# Patient Record
Sex: Male | Born: 1955 | Race: White | Hispanic: No | Marital: Married | Smoking: Former smoker
Health system: Southern US, Community
[De-identification: ages and names within clinical notes are randomized; demographics above are authoritative.]

## PROBLEM LIST (undated history)

## (undated) DIAGNOSIS — E119 Type 2 diabetes mellitus without complications: Secondary | ICD-10-CM

## (undated) DIAGNOSIS — D689 Coagulation defect, unspecified: Secondary | ICD-10-CM

## (undated) DIAGNOSIS — K219 Gastro-esophageal reflux disease without esophagitis: Secondary | ICD-10-CM

## (undated) DIAGNOSIS — F32A Depression, unspecified: Secondary | ICD-10-CM

## (undated) DIAGNOSIS — Z5189 Encounter for other specified aftercare: Secondary | ICD-10-CM

## (undated) DIAGNOSIS — F419 Anxiety disorder, unspecified: Secondary | ICD-10-CM

## (undated) DIAGNOSIS — I10 Essential (primary) hypertension: Secondary | ICD-10-CM

## (undated) DIAGNOSIS — F329 Major depressive disorder, single episode, unspecified: Secondary | ICD-10-CM

## (undated) DIAGNOSIS — G709 Myoneural disorder, unspecified: Secondary | ICD-10-CM

## (undated) DIAGNOSIS — M1990 Unspecified osteoarthritis, unspecified site: Secondary | ICD-10-CM

## (undated) HISTORY — PX: CHOLECYSTECTOMY: SHX55

## (undated) HISTORY — DX: Major depressive disorder, single episode, unspecified: F32.9

## (undated) HISTORY — DX: Essential (primary) hypertension: I10

## (undated) HISTORY — DX: Type 2 diabetes mellitus without complications: E11.9

## (undated) HISTORY — DX: Unspecified osteoarthritis, unspecified site: M19.90

## (undated) HISTORY — DX: Depression, unspecified: F32.A

## (undated) HISTORY — PX: LEG AMPUTATION: SHX1105

## (undated) HISTORY — DX: Coagulation defect, unspecified: D68.9

## (undated) HISTORY — DX: Anxiety disorder, unspecified: F41.9

## (undated) HISTORY — DX: Gastro-esophageal reflux disease without esophagitis: K21.9

## (undated) HISTORY — DX: Encounter for other specified aftercare: Z51.89

## (undated) HISTORY — DX: Myoneural disorder, unspecified: G70.9

---

## 2014-09-19 ENCOUNTER — Encounter (INDEPENDENT_AMBULATORY_CARE_PROVIDER_SITE_OTHER): Payer: Self-pay

## 2014-09-19 ENCOUNTER — Inpatient Hospital Stay: Payer: Medicare Other | Attending: Hematology and Oncology | Admitting: Hematology and Oncology

## 2014-09-19 ENCOUNTER — Encounter: Payer: Self-pay | Admitting: Hematology and Oncology

## 2014-09-19 ENCOUNTER — Inpatient Hospital Stay: Payer: Medicare Other

## 2014-09-19 VITALS — BP 144/82 | HR 75 | Temp 97.7°F

## 2014-09-19 DIAGNOSIS — I10 Essential (primary) hypertension: Secondary | ICD-10-CM | POA: Diagnosis not present

## 2014-09-19 DIAGNOSIS — Z79899 Other long term (current) drug therapy: Secondary | ICD-10-CM | POA: Diagnosis not present

## 2014-09-19 DIAGNOSIS — F419 Anxiety disorder, unspecified: Secondary | ICD-10-CM | POA: Diagnosis not present

## 2014-09-19 DIAGNOSIS — K219 Gastro-esophageal reflux disease without esophagitis: Secondary | ICD-10-CM | POA: Diagnosis not present

## 2014-09-19 DIAGNOSIS — B1920 Unspecified viral hepatitis C without hepatic coma: Secondary | ICD-10-CM | POA: Insufficient documentation

## 2014-09-19 DIAGNOSIS — Z87891 Personal history of nicotine dependence: Secondary | ICD-10-CM | POA: Diagnosis not present

## 2014-09-19 DIAGNOSIS — E119 Type 2 diabetes mellitus without complications: Secondary | ICD-10-CM | POA: Insufficient documentation

## 2014-09-19 DIAGNOSIS — F329 Major depressive disorder, single episode, unspecified: Secondary | ICD-10-CM | POA: Diagnosis not present

## 2014-09-19 DIAGNOSIS — D66 Hereditary factor VIII deficiency: Secondary | ICD-10-CM | POA: Diagnosis not present

## 2014-09-19 NOTE — Progress Notes (Signed)
Unable to weigh patient today. Right leg amputated.  Pt in mechanical wheel chair.

## 2014-09-24 LAB — FACTOR 8 ASSAY: Coagulation Factor VIII: 45 % — ABNORMAL LOW (ref 50–150)

## 2014-10-23 ENCOUNTER — Other Ambulatory Visit: Payer: Self-pay | Admitting: *Deleted

## 2014-10-23 NOTE — Telephone Encounter (Signed)
  We will need to get those from his prior hematologist.  Jerilynn Mages

## 2014-10-24 NOTE — Telephone Encounter (Signed)
He is no longer seeing his other hematologist as he has moved form [place] to [facility]

## 2014-10-24 NOTE — Telephone Encounter (Signed)
I called and spoke with Nicki Reaper before noon and asked that he fax a written request for exactly what the patient needs. Gave him my fax number. He agreed to send it

## 2014-10-28 ENCOUNTER — Telehealth: Payer: Self-pay | Admitting: *Deleted

## 2014-10-28 NOTE — Telephone Encounter (Signed)
  OK.  Can you get him additional pills/refills?  M

## 2014-10-28 NOTE — Telephone Encounter (Signed)
[facility] notified

## 2014-10-29 NOTE — Telephone Encounter (Signed)
  Spoke to pharmacist about his factor.  He has had some variability with his dosing in the past. They try to obtain vials closest to the ordered amount (for him 3000 units). He has received between 2900 and 4000 units in the past.  Drug approved.  Please let patient know.  Thanks,  M

## 2014-10-30 ENCOUNTER — Telehealth: Payer: Self-pay

## 2014-10-30 NOTE — Telephone Encounter (Signed)
Per Dr. Mike Gip: Spoke to pharmacist about his factor.  He has had some variability with his dosing in the past. They try to obtain vials closest to the ordered amount (for him 3000 units). He has received between 2900 and 4000 units in the past.  Drug approved. Please let patient know.

## 2015-01-07 DIAGNOSIS — H9190 Unspecified hearing loss, unspecified ear: Secondary | ICD-10-CM | POA: Insufficient documentation

## 2015-01-07 DIAGNOSIS — D333 Benign neoplasm of cranial nerves: Secondary | ICD-10-CM | POA: Insufficient documentation

## 2015-01-07 DIAGNOSIS — B182 Chronic viral hepatitis C: Secondary | ICD-10-CM | POA: Insufficient documentation

## 2015-01-07 DIAGNOSIS — S78111A Complete traumatic amputation at level between right hip and knee, initial encounter: Secondary | ICD-10-CM | POA: Insufficient documentation

## 2015-01-07 DIAGNOSIS — M362 Hemophilic arthropathy: Secondary | ICD-10-CM | POA: Insufficient documentation

## 2015-03-09 DIAGNOSIS — R221 Localized swelling, mass and lump, neck: Secondary | ICD-10-CM | POA: Insufficient documentation

## 2015-03-10 ENCOUNTER — Encounter: Payer: Self-pay | Admitting: Hematology and Oncology

## 2015-03-10 NOTE — Progress Notes (Signed)
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 Jerl Mina for new patient assessment.  HPI: The patient states that he initially presented with excess bleeding status post circumcision. Bleeding was treated with ice and compression. He required transfusion at the age of 2 after biting his tongue. Following knee surgery he had his second significant bleed. Subsequently, he has had spontaneous bleeding into joints.  Patient states that beginning in 1982, he received cryoprecipitate as needed. He started factor support in 1985-1987 and then beginning in 1993-1995 began regular of factor support every other day. He currently receives factor support every day. On this regimen, he denies any bleeding. He believes his factor VIII level are less than 5%. He receives Kogenate 2500 units a day. As far as he is aware, he has had no inhibitors. His bleeding is under control.  Patient's course has been complicated by hepatitis C secondary to his initial transfusions. He states he underwent 3 rounds of interferon and now has normal liver function tests.  Patient notes that with surgery requires an extra dose of Kogenate 2 hours before. He then receives factor support every 6 hours, then every 8 hours, then every 12 hours, and then once a day. He is unaware of any recent factor levels.  Available labs from 02/05/2014 revealed a hematocrit of 44.8, hemoglobin 15.5, MCV 92, platelets 148,000, white count 14,000 with an ANC of 9200. Creatinine was 1.04.  Symptomatically, he has issues with limited mobility secondary to a right above the knee amputation as well as a left lower extremity fusion.  He has ongoing problems with his right shoulder.  Past Medical History  Diagnosis Date  . Anxiety   . Arthritis   . Blood transfusion without reported diagnosis   . Clotting disorder   .  Depression   . Diabetes mellitus without complication   . GERD (gastroesophageal reflux disease)   . Hypertension   . Neuromuscular disorder     Past Surgical History  Procedure Laterality Date  . Cholecystectomy      No family history on file.  Social History:  reports that he quit smoking about 36 years ago. He does not have any smokeless tobacco history on file. His alcohol and drug histories are not on file.  Moved from [place] 2 months ago.  The patient is accompanied by his wife, Olegario Messier, today.  Allergies:  Allergies  Allergen Reactions  . Aspirin Other (See Comments)    bleeding    Current Medications: Current Outpatient Prescriptions  Medication Sig Dispense Refill  . Antihemophilic Factor, Recomb, 3000 UNITS KIT Inject into the vein daily.    . celecoxib (CELEBREX) 200 MG capsule Take by mouth.    . DULoxetine (CYMBALTA) 60 MG capsule Take by mouth.    . esomeprazole (NEXIUM) 40 MG capsule Take by mouth.    Marland Kitchen lisinopril (PRINIVIL,ZESTRIL) 5 MG tablet Take by mouth.    . metFORMIN (GLUCOPHAGE) 1000 MG tablet Take by mouth.    . OxyCODONE (OXYCONTIN) 20 mg T12A 12 hr tablet Take by mouth.    . OxyCODONE (OXYCONTIN) 80 mg T12A 12 hr tablet Take by mouth.    . pravastatin (PRAVACHOL) 20 MG tablet Take by mouth.    . saxagliptin HCl (ONGLYZA) 5 MG TABS tablet Take by mouth.    . tamsulosin (FLOMAX) 0.4 MG CAPS capsule Take by mouth.  No current facility-administered medications for this visit.    Review of Systems:  GENERAL:  Feels "ok".  No fevers, sweats or weight loss. PERFORMANCE STATUS (ECOG):  2 HEENT:  No visual changes, runny nose, sore throat, mouth sores or tenderness. Lungs: No shortness of breath or cough.  No hemoptysis. Cardiac:  No chest pain, palpitations, orthopnea, or PND. GI:  Heartburn treated with Nexium.  No nausea, vomiting, diarrhea, constipation, melena or hematochezia.  Declines colonoscopy. GU:  Difficulty with urination secondary to  enlarged prostate.  No urgency, frequency, dysuria, or hematuria. Musculoskeletal:  No back pain.  No joint pain.  No muscle tenderness. Extremities:  No pain or swelling. Skin:  No rashes or skin changes. Neuro:  No headache, numbness or weakness, balance or coordination issues. Endocrine:  Little diabetes.  No thyroid issues, hot flashes or night sweats. Psych:  Poor sleep.  No mood changes, depression or anxiety. Pain:  No focal pain. Review of systems:  All other systems reviewed and found to be negative.  Physical Exam: Blood pressure 144/82, pulse 75, temperature 97.7 F (36.5 C), temperature source Oral. GENERAL:  Well developed, well nourished, sitting comfortably in an electric wheelchair in the exam room in no acute distress. MENTAL STATUS:  Alert and oriented to person, place and time. HEAD:  Wearing a cap.  Gray beard.  Normocephalic, atraumatic, face symmetric, no Cushingoid features. EYES:  Blue eyes.  Pupils equal round and reactive to light and accomodation.  No conjunctivitis or scleral icterus. ENT:  Oropharynx clear without lesion.  Dentures.  Tongue normal. Mucous membranes moist.  RESPIRATORY:  Clear to auscultation without rales, wheezes or rhonchi. CARDIOVASCULAR:  Regular rate and rhythm without murmur, rub or gallop. ABDOMEN:  Soft, non-tender, with active bowel sounds, and no hepatosplenomegaly.  No masses. SKIN:  No rashes, ulcers or lesions. EXTREMITIES: Left leg extended secondary to lower extremity fusion.  Left lower digit partial amputation.  Right above the knee amputation. Limited extension and movement of right shoulder.  No skin discoloration or tenderness.  No palpable cords. LYMPH NODES: No palpable cervical, supraclavicular, axillary or inguinal adenopathy  NEUROLOGICAL: Unremarkable. PSYCH:  Appropriate.  Appointment on 09/19/2014  Component Date Value Ref Range Status  . 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  His course has been complicated by multiple bleeding episodes. He is status post right above-the-knee amputation, left lower extremity knee fusion, and limited movement in his right shoulder. He receives prophylactic Kogenate daily.  He has a history of hepatitis C s/p interferon.  Plan: 1. Review entire medical history, diagnosis and treatment of hemophilia A. 2. Labs today:  Factor VIII level. 3. Refer to [facility] hemophilia orthopedics. 4. Follow-up with pain management. 5. RTC in 6 months for MD assessment and labs (CBC with diff, CMP)   Lequita Asal, MD  09/19/2014

## 2015-03-24 ENCOUNTER — Ambulatory Visit: Payer: Medicare Other | Admitting: Hematology and Oncology

## 2015-03-27 ENCOUNTER — Ambulatory Visit: Payer: Medicare Other | Admitting: Hematology and Oncology

## 2015-10-19 ENCOUNTER — Encounter: Payer: Self-pay | Admitting: Cardiovascular Disease

## 2015-10-19 ENCOUNTER — Ambulatory Visit (INDEPENDENT_AMBULATORY_CARE_PROVIDER_SITE_OTHER)
Admission: RE | Admit: 2015-10-19 | Discharge: 2015-10-19 | Disposition: A | Discharge status: Home / Self Care | Payer: Medicare Other | Source: Ambulatory Visit | Attending: Cardiovascular Disease | Admitting: Cardiovascular Disease

## 2015-10-19 ENCOUNTER — Ambulatory Visit (INDEPENDENT_AMBULATORY_CARE_PROVIDER_SITE_OTHER): Payer: Medicare Other | Admitting: Cardiovascular Disease

## 2015-10-19 VITALS — BP 155/90 | HR 95 | Ht 68.0 in | Wt 230.0 lb

## 2015-10-19 DIAGNOSIS — Z8249 Family history of ischemic heart disease and other diseases of the circulatory system: Secondary | ICD-10-CM

## 2015-10-19 DIAGNOSIS — E11618 Type 2 diabetes mellitus with other diabetic arthropathy: Secondary | ICD-10-CM | POA: Diagnosis not present

## 2015-10-19 DIAGNOSIS — E785 Hyperlipidemia, unspecified: Secondary | ICD-10-CM | POA: Insufficient documentation

## 2015-10-19 DIAGNOSIS — F17200 Nicotine dependence, unspecified, uncomplicated: Secondary | ICD-10-CM | POA: Insufficient documentation

## 2015-10-19 DIAGNOSIS — Z720 Tobacco use: Secondary | ICD-10-CM | POA: Diagnosis not present

## 2015-10-19 DIAGNOSIS — D66 Hereditary factor VIII deficiency: Secondary | ICD-10-CM

## 2015-10-19 DIAGNOSIS — E669 Obesity, unspecified: Secondary | ICD-10-CM

## 2015-10-19 NOTE — Progress Notes (Signed)
Patient ID: Shawn Cardenas, male   DOB: [birth year], 60 y.o.   MRN: [id number] Cardiology Office Note  Date:  10/19/2015   ID:  Shawn Cardenas, DOB 24-Nov-1955, MRN [id number]  PCP:  [name], MD   Chief Complaint  Patient presents with  . Other    Fm Hx MI c/o chest tightness and heart pounding.  Meds reviewd verbally with pt.    HPI:  Shawn Cardenas is a pleasant 60 year old gentleman with history of hemophilia, leg and dictation above-the-knee on the right secondary to infection/sepsis, history of diabetes2 managed on medications, history of smoking started several years ago, remote smoking as a teenager into his young 66s, who presents By self-referral for family history of coronary disease, palpitations   He is not reactive given his amputation Most of his activity includes moving himself around, transferring, showering etc. Reports he is able to take care of himself, able to drive Denies any significant chest pain or shortness of breath on exertion His main concern is tremendous family history of MI. He reports that his cousin, father, brother had MIs  Previous problems with hemophilia, he reports this is well controlled with a shot prior to any procedures  Lab work reviewed with him showing total cholesterol 167, hemoglobin A1c 5.8 Wife reports that he likes to eat bacon, french fries. She tries to cook a well-balanced diet Currently smokes 2 packs per day Reports blood pressure at home typically in the 353 systolic range  EKG on today's visit shows normal sinus rhythm with rate 95 bpm, right bundle branch block, left anterior fascicular block  PMH:   has a past medical history of Anxiety; Arthritis; Blood transfusion without reported diagnosis; Clotting disorder ([place]); Depression; Diabetes mellitus without complication ([place]); GERD (gastroesophageal reflux disease); Hypertension; and Neuromuscular disorder ([place]).  PSH:    Past Surgical History  Procedure Laterality Date  .  Cholecystectomy      Current Outpatient Prescriptions  Medication Sig Dispense Refill  . Antihemophilic Factor, Recomb, 2992 UNITS KIT Inject into the vein daily.    . celecoxib (CELEBREX) 200 MG capsule Take 200 mg by mouth daily.     . DULoxetine (CYMBALTA) 60 MG capsule Take 60 mg by mouth daily.     Marland Kitchen esomeprazole (NEXIUM) 40 MG capsule Take 40 mg by mouth daily.     Marland Kitchen GABAPENTIN PO Take by mouth as directed.    Marland Kitchen lisinopril (PRINIVIL,ZESTRIL) 5 MG tablet Take 5 mg by mouth daily.     . metFORMIN (GLUCOPHAGE) 1000 MG tablet Take 2,000 mg by mouth 2 (two) times daily with a meal.     . oxymorphone (OPANA ER) 20 MG 12 hr tablet Take 20 mg by mouth every 12 (twelve) hours.    . OxyMORphone HCl (OPANA PO) Take 80 mg by mouth as directed.    . pravastatin (PRAVACHOL) 20 MG tablet Take 20 mg by mouth daily.     . saxagliptin HCl (ONGLYZA) 5 MG TABS tablet Take 5 mg by mouth daily.     . tamsulosin (FLOMAX) 0.4 MG CAPS capsule Take 0.4 mg by mouth daily.      No current facility-administered medications for this visit.     Allergies:   Aspirin   Social History:  The patient  reports that he has been smoking Cigarettes.  He has smoked for the past 20 years. He does not have any smokeless tobacco history on file. He reports that he does not drink alcohol or use illicit drugs.  Family History:   family history includes Heart Problems in his mother; Heart attack in his brother, cousin, and father.    Review of Systems: Review of Systems  Constitutional: Negative.   Respiratory: Negative.   Cardiovascular: Negative.   Gastrointestinal: Negative.   Musculoskeletal: Negative.   Neurological: Negative.   Psychiatric/Behavioral: Negative.   All other systems reviewed and are negative.    PHYSICAL EXAM: VS:  BP 155/90 mmHg  Pulse 95  Ht '5\' 8"'$  (1.727 m)  Wt 230 lb (104.327 kg)  BMI 34.98 kg/m2 , BMI Body mass index is 34.98 kg/(m^2). GEN: Well nourished, well developed, in no acute  distress, obese HEENT: normal Neck: no JVD, carotid bruits, or masses Cardiac: RRR; no murmurs, rubs, or gallops,no edema  Respiratory:  clear to auscultation bilaterally, normal work of breathing GI: soft, nontender, nondistended, + BS MS: no deformity or atrophy, Amputation right lower extremity, left leg with decreased range of motion Skin: warm and dry, no rash Neuro:  Strength and sensation are intact Psych: euthymic mood, full affect    Recent Labs: No results found for requested labs within last 365 days.  Lab work reviewed as above, access through the [place] system, reviewed with the patient  Lipid Panel Total cholesterol 167   Wt Readings from Last 3 Encounters:  10/19/15 230 lb (104.327 kg)       ASSESSMENT AND PLAN:  Family history of MI (myocardial infarction) - Plan: EKG 12-Lead, CT CARDIAC SCORING Details as above, Long discussion about various studies to risk stratify him After long discussion, we will order CT coronary calcium score  Smoker Long discussion concerning his smoking. 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 Depending on his CT coronary calcium score resolved, could aim for total cholesterol less than 150   Disposition:   F/U  Prn We will call him with the results of his CT coronary calcium score   Total encounter time more than 45 minutes  Greater than 50% was spent in counseling and coordination of care with the patient     Orders Placed This Encounter  Procedures  . CT CARDIAC SCORING  . EKG 12-Lead     Signed, Esmond Plants, M.D., Ph.D. 10/19/2015  [place], [place]

## 2015-10-19 NOTE — Patient Instructions (Addendum)
Monitor your blood pressure,  Goal is <140 on the top number, <90 on the bottom  You are scheduled for a CT coronary calcium score  Today @ 3:00, please arrive @ 2:45 There is a one-time fee of $150 due at the time of your procedure [address], [address], [place]   Follow-Up: It was a pleasure seeing you in the office today. Please call us if you have new issues that need to be addressed before your next appt.  [phone]  Your physician wants you to follow-up: as needed   If you need a refill on your cardiac medications before your next appointment, please call your pharmacy.  Coronary Calcium Scan A coronary calcium scan is an imaging test used to look for deposits of calcium and other fatty materials (plaques) in the inner lining of the blood vessels of your heart (coronary arteries). These deposits of calcium and plaques can partly clog and narrow the coronary arteries without producing any symptoms or warning signs. This puts you at risk for a heart attack. This test can detect these deposits before symptoms develop.  LET [facility] CARE PROVIDER KNOW ABOUT:  Any allergies you have.  All medicines you are taking, including vitamins, herbs, eye drops, creams, and over-the-counter medicines.  Previous problems you or members of your family have had with the use of anesthetics.  Any blood disorders you have.  Previous surgeries you have had.  Medical conditions you have.  Possibility of pregnancy, if this applies. RISKS AND COMPLICATIONS Generally, this is a safe procedure. However, as with any procedure, complications can occur. This test involves the use of radiation. Radiation exposure can be dangerous to a pregnant woman and her unborn baby. If you are pregnant, you should not have this procedure done.  BEFORE THE PROCEDURE There is no special preparation for the procedure. PROCEDURE  You will need to undress and put on a hospital gown. You will  need to remove any jewelry around your neck or chest.  Sticky electrodes are placed on your chest and are connected to an electrocardiogram (EKG or electrocardiography) machine to recorda tracing of the electrical activity of your heart.  A CT scanner will take pictures of your heart. During this time, you will be asked to lie still and hold your breath for 2-3 seconds while a picture is being taken of your heart. AFTER THE PROCEDURE   You will be allowed to get dressed.  You can return to your normal activities after the scan is done.   This information is not intended to replace advice given to you by your health care provider. Make sure you discuss any questions you have with your health care provider.   Document Released: 10/08/2007 Document Revised: 04/16/2013 Document Reviewed: 12/17/2012 Elsevier Interactive Patient Education [employer].

## 2015-10-21 ENCOUNTER — Other Ambulatory Visit: Payer: Self-pay

## 2015-10-21 MED ORDER — PRAVASTATIN SODIUM 40 MG PO TABS: 40.0000 mg | ORAL_TABLET | Freq: Every day | ORAL | Status: DC

## 2015-12-14 ENCOUNTER — Ambulatory Visit: Payer: Medicare Other | Admitting: Cardiovascular Disease

## 2016-02-25 ENCOUNTER — Emergency Department: Payer: Medicare Other

## 2016-02-25 ENCOUNTER — Emergency Department
Admission: EM | Admit: 2016-02-25 | Discharge: 2016-02-26 | Discharge status: Against Medical Advice | Payer: Medicare Other | Attending: Emergency Medicine | Admitting: Emergency Medicine

## 2016-02-25 DIAGNOSIS — I10 Essential (primary) hypertension: Secondary | ICD-10-CM | POA: Insufficient documentation

## 2016-02-25 DIAGNOSIS — T8189XA Other complications of procedures, not elsewhere classified, initial encounter: Secondary | ICD-10-CM | POA: Diagnosis not present

## 2016-02-25 DIAGNOSIS — E1165 Type 2 diabetes mellitus with hyperglycemia: Secondary | ICD-10-CM | POA: Insufficient documentation

## 2016-02-25 DIAGNOSIS — E119 Type 2 diabetes mellitus without complications: Secondary | ICD-10-CM | POA: Diagnosis not present

## 2016-02-25 DIAGNOSIS — Z7984 Long term (current) use of oral hypoglycemic drugs: Secondary | ICD-10-CM | POA: Diagnosis not present

## 2016-02-25 DIAGNOSIS — T148XXA Other injury of unspecified body region, initial encounter: Secondary | ICD-10-CM

## 2016-02-25 DIAGNOSIS — L089 Local infection of the skin and subcutaneous tissue, unspecified: Secondary | ICD-10-CM | POA: Diagnosis present

## 2016-02-25 DIAGNOSIS — Y828 Other medical devices associated with adverse incidents: Secondary | ICD-10-CM | POA: Insufficient documentation

## 2016-02-25 DIAGNOSIS — Z89611 Acquired absence of right leg above knee: Secondary | ICD-10-CM | POA: Insufficient documentation

## 2016-02-25 DIAGNOSIS — F17210 Nicotine dependence, cigarettes, uncomplicated: Secondary | ICD-10-CM | POA: Diagnosis not present

## 2016-02-25 LAB — CBC WITH DIFFERENTIAL/PLATELET
Basophils Absolute: 0.1 10*3/uL (ref 0–0.1)
Basophils Relative: 0 %
EOS ABS: 0.2 10*3/uL (ref 0–0.7)
EOS PCT: 1 %
HCT: 43 % (ref 40.0–52.0)
HEMOGLOBIN: 14.8 g/dL (ref 13.0–18.0)
LYMPHS ABS: 1.9 10*3/uL (ref 1.0–3.6)
LYMPHS PCT: 15 %
MCH: 31 pg (ref 26.0–34.0)
MCHC: 34.5 g/dL (ref 32.0–36.0)
MCV: 89.9 fL (ref 80.0–100.0)
MONOS PCT: 4 %
Monocytes Absolute: 0.5 10*3/uL (ref 0.2–1.0)
NEUTROS PCT: 80 %
Neutro Abs: 10.3 10*3/uL — ABNORMAL HIGH (ref 1.4–6.5)
Platelets: 134 10*3/uL — ABNORMAL LOW (ref 150–440)
RBC: 4.78 MIL/uL (ref 4.40–5.90)
RDW: 13.4 % (ref 11.5–14.5)
WBC: 12.9 10*3/uL — ABNORMAL HIGH (ref 3.8–10.6)

## 2016-02-25 LAB — COMPREHENSIVE METABOLIC PANEL
ALK PHOS: 79 U/L (ref 38–126)
ALT: 15 U/L — AB (ref 17–63)
ANION GAP: 7 (ref 5–15)
AST: 19 U/L (ref 15–41)
Albumin: 3.4 g/dL — ABNORMAL LOW (ref 3.5–5.0)
BUN: 18 mg/dL (ref 6–20)
CALCIUM: 9 mg/dL (ref 8.9–10.3)
CO2: 27 mmol/L (ref 22–32)
CREATININE: 1.1 mg/dL (ref 0.61–1.24)
Chloride: 101 mmol/L (ref 101–111)
Glucose, Bld: 352 mg/dL — ABNORMAL HIGH (ref 65–99)
Potassium: 4.7 mmol/L (ref 3.5–5.1)
SODIUM: 135 mmol/L (ref 135–145)
TOTAL PROTEIN: 6.9 g/dL (ref 6.5–8.1)
Total Bilirubin: 0.6 mg/dL (ref 0.3–1.2)

## 2016-02-25 MED ORDER — CEFEPIME-DEXTROSE 2 GM/50ML IV SOLR: 2.0000 g | Freq: Once | INTRAVENOUS | Status: DC

## 2016-02-25 MED ORDER — CEFEPIME-DEXTROSE 2 GM/50ML IV SOLR
2.0000 g | Freq: Two times a day (BID) | INTRAVENOUS | Status: DC
  Administered 2016-02-25 – 2016-02-26 (×2): 2 g via INTRAVENOUS
  Filled 2016-02-25 (×2): qty 50

## 2016-02-25 MED ORDER — VANCOMYCIN HCL IN DEXTROSE 1-5 GM/200ML-% IV SOLN: 1000.0000 mg | Freq: Two times a day (BID) | INTRAVENOUS | Status: DC

## 2016-02-25 MED ORDER — VANCOMYCIN HCL IN DEXTROSE 1-5 GM/200ML-% IV SOLN
1000.0000 mg | Freq: Once | INTRAVENOUS | Status: AC
  Administered 2016-02-25: 1000 mg via INTRAVENOUS
  Filled 2016-02-25: qty 200

## 2016-02-25 NOTE — ED Notes (Signed)
Patient is not in room at medication administration time.

## 2016-02-25 NOTE — ED Provider Notes (Signed)
[facility] Emergency Department Provider Note   ____________________________________________   First MD Initiated Contact with Patient 02/25/16 1605     (approximate)  I have reviewed the triage vital signs and the nursing notes.   HISTORY  Chief Complaint Wound Infection   HPI Hriday Stai is a 60 y.o. male with a history of diabetes as well as a right lower extremity AKA who is presenting to the emergency department with discharge from his anterior left knee. He says that he has had multiple surgeries on his left lower extremity and now is a bar extending from his femur to his tibia. He says that he was supposed be admitted yesterday to [facility] for IV antibiotics but says that the wait was too long and so he left. He then went to the [place] clinic today who sent him to this emergency department for further workup and possible admission for IV antibiotics. The patient says that he had his previous left lower extremity surgeries in [place] greater than 2 years ago. 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 Urinalysis is leg in extension on the left. He denies any fever or chills. There is suspicion of a possible fistula tract draining to this anterior wound.   Past Medical History:  Diagnosis Date  . Anxiety   . Arthritis   . Blood transfusion without reported diagnosis   . Clotting disorder ([place])   . Depression   . Diabetes mellitus without complication ([place])   . GERD (gastroesophageal reflux disease)   . Hypertension   . Neuromuscular disorder [facility])     Patient Active Problem List   Diagnosis Date Noted  . Family history of MI (myocardial infarction) 10/19/2015  . Obesity 10/19/2015  . 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 Smoker 10/19/2015  . Hyperlipidemia 10/19/2015  . 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 CHOLECYSTECTOMY      Prior to Admission medications   Medication Sig Start Date End Date Taking? Authorizing Provider  Antihemophilic Factor, Recomb, 8916 UNITS KIT Inject into the vein daily.    Historical Provider, MD  celecoxib (CELEBREX) 200 MG capsule Take 200 mg by mouth daily.     Historical Provider, MD  DULoxetine (CYMBALTA) 60 MG capsule Take 60 mg by mouth daily.     Historical Provider, MD  esomeprazole (NEXIUM) 40 MG capsule Take 40 mg by mouth daily.     Historical Provider, MD  GABAPENTIN PO Take by mouth as directed.    Historical Provider, MD  lisinopril (PRINIVIL,ZESTRIL) 5 MG tablet Take 5 mg by mouth daily.     Historical Provider, MD  metFORMIN (GLUCOPHAGE) 1000 MG tablet Take 2,000 mg by mouth 2 (two) times daily with a meal.     Historical Provider, MD  oxymorphone (OPANA ER) 20 MG 12 hr tablet Take 20 mg by mouth every 12 (twelve) hours.    Historical Provider, MD  OxyMORphone HCl (OPANA PO) Take 80 mg by mouth as directed.    Historical Provider, MD  pravastatin (PRAVACHOL) 40 MG tablet Take 1 tablet (40 mg total) by mouth daily. 10/21/15   Minna Merritts, MD  saxagliptin HCl (ONGLYZA) 5 MG TABS tablet Take 5 mg by mouth daily.     Historical Provider, MD  tamsulosin (FLOMAX) 0.4 MG CAPS  capsule Take 0.4 mg by mouth daily.     Historical Provider, MD    Allergies Aspirin  Family History  Problem Relation Age of Onset  . Heart Problems Mother   . Heart attack Father   . Heart attack Brother   . Heart attack Cousin     Social History Social History  Substance Use Topics  . Smoking status: Current Every Day Smoker    Years: 20.00    Types: Cigarettes    Last attempt to quit: 04/24/1978  . Smokeless tobacco: Not on file  . Alcohol use No    Review of Systems Constitutional: No fever/chills Eyes: No visual changes. ENT: No sore throat. Cardiovascular: Denies chest  pain. Respiratory: Denies shortness of breath. Gastrointestinal: No abdominal pain.  No nausea, no vomiting.  No diarrhea.  No constipation. Genitourinary: Negative for dysuria. Musculoskeletal: Negative for back pain. Skin: Negative for rash. Neurological: Negative for headaches, focal weakness or numbness.  10-point ROS otherwise negative.  ____________________________________________   PHYSICAL EXAM:  VITAL SIGNS: ED Triage Vitals  Enc Vitals Group     BP 02/25/16 1413 (!) 163/81     Pulse Rate 02/25/16 1413 (!) 110     Resp 02/25/16 1413 18     Temp 02/25/16 1413 98.8 F (37.1 C)     Temp Source 02/25/16 1413 Oral     SpO2 02/25/16 1413 96 %     Weight 02/25/16 1413 210 lb (95.3 kg)     Height 02/25/16 1413 4' (1.219 m)     Head Circumference --      Peak Flow --      Pain Score 02/25/16 1526 0     Pain Loc --      Pain Edu? --      Excl. in [place]? --     Constitutional: Alert and oriented. Well appearing and in no acute distress. Eyes: Conjunctivae are normal. PERRL. EOMI. Head: Atraumatic. Nose: No congestion/rhinnorhea. Mouth/Throat: Mucous membranes are moist.   Neck: No stridor.   Cardiovascular: Tachycardic, regular rhythm. Grossly normal heart sounds.  Good peripheral circulation. Respiratory: Normal respiratory effort.  No retractions. Lungs CTAB. Gastrointestinal: Soft and nontender. No distention. No abdominal bruits. No CVA tenderness. Musculoskeletal: Right lower extremity AKA. Left lower extremity held in extension. 2 cm superficial wound draining a small amount of pus. There is a very minimal wheal of erythema surrounding without any induration. No fluctuance. Mild tenderness to palpation. Intact dorsalis pedis pulse to left lower extremity. No crepitus. Neurologic:  Normal speech and language. No gross focal neurologic deficits are appreciated.  Skin:  no rash noted. Psychiatric: Mood and affect are normal. Speech and behavior are  normal.  ____________________________________________   LABS (all labs ordered are listed, but only abnormal results are displayed)  Labs Reviewed  CBC WITH DIFFERENTIAL/PLATELET - Abnormal; Notable for the following:       Result Value   WBC 12.9 (*)    Platelets 134 (*)    Neutro Abs 10.3 (*)    All other components within normal limits  COMPREHENSIVE METABOLIC PANEL - Abnormal; Notable for the following:    Glucose, Bld 352 (*)    Albumin 3.4 (*)    ALT 15 (*)    All other components within normal limits   ____________________________________________  EKG   ____________________________________________  RADIOLOGY  DG Knee Complete 4 Views Left (Accession [id number]) (Order [id number])  Imaging  Date: 02/25/2016 Department: [facility] EMERGENCY DEPARTMENT Released By/Authorizing: Shanon Brow  Talmadge Chad, MD (auto-released)  Exam Information   Status Exam Begun  Exam Ended   Final [99] 02/25/2016 5:24 PM 02/25/2016 5:46 PM  PACS Images   Show images for DG Knee Complete 4 Views Left  Study Result   CLINICAL DATA:  Focal left knee skin infection with light yellow and white pus with scant blood coming out of the area of infection. Left knee surgery in 1987.  EXAM: LEFT KNEE - COMPLETE 4+ VIEW  COMPARISON:  None.  FINDINGS: There is solid bone and screw and rod fusion of the femur and tibia at the knee joint. Mild diffuse subcutaneous edema. No soft tissue gas or periosteal reaction seen.  IMPRESSION: Mild diffuse subcutaneous edema with no soft tissue gas or evidence of osteomyelitis.   Electronically Signed   By: Claudie Revering M.D.   On: 02/25/2016 17:53     ____________________________________________   PROCEDURES  Procedure(s) performed:   Procedures  Critical Care performed:   ____________________________________________   INITIAL IMPRESSION / ASSESSMENT AND PLAN / ED COURSE  Pertinent labs & imaging  results that were available during my care of the patient were reviewed by me and considered in my medical decision making (see chart for details).  ----------------------------------------- 6:53 PM on 02/25/2016 ----------------------------------------- Discussed case with Dr. Sabra Heck orthopedics recommends transfer to [facility]. He says the patient is too complicated for further care at [facility].  Discussed with the patient and he understands the reasoning for transfer. Call made to [facility] transfer center.   Clinical Course   ----------------------------------------- 9:46 PM on 02/25/2016 -----------------------------------------  Discussed the case with Dr. Terence Lux of the orthopedic service at [facility] who would've accepted the patient for an emergency department to emergency department transfer except for that [facility] is on diversion at this time. I discussed with Dr. Terence Lux starting antibiotics and he recommended this. I'll be starting the patient on vancomycin as well as cefepime. I also called [facility] and discussed the case with Dr. Renard Hamper of the hospitalist service because of the diversion status at [facility].  The patient is now on wait lists at both [place] and is aware of the extended emergency department wait.  Attempted to discuss transfer with [facility] ER attending, Dr. Evelena Leyden, however Dr. Evelena Leyden was involved with a Trauma.  Transfer center said that they would be calling back with the ER doctor on the line for sign out.  Signed out to Dr. Mariea Clonts.    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    NEW MEDICATIONS STARTED DURING THIS VISIT:  New Prescriptions   No medications on file     Note:  This document was prepared using Dragon voice recognition software and may include unintentional dictation errors.    Orbie Pyo, MD 02/25/16 2151

## 2016-02-25 NOTE — ED Notes (Signed)
Pt denies pain. Pt stated light yellow and white pus with scant blood coming out of a sore in the left knee area. Pt has medical hx of hemophilia.

## 2016-02-25 NOTE — ED Notes (Signed)
Patient placed on [facility] waiting list according to secretary.

## 2016-02-25 NOTE — ED Triage Notes (Signed)
Pt sent to ER by PCP for leg infection to left leg. Pt has steel rod in left leg. States yellow drainage coming from leg. Redness present around knee.

## 2016-02-25 NOTE — Progress Notes (Addendum)
ANTIBIOTIC CONSULT NOTE - INITIAL  Pharmacy Consult for Vancomycin, Cefepime Indication: osteomyelitis  Allergies  Allergen Reactions  . Aspirin Other (See Comments)    bleeding    Patient Measurements: Height: 4' (121.9 cm) Weight: 210 lb (95.3 kg) IBW/kg (Calculated) : 22.4 Adjusted Body Weight: 51.6 kg   Vital Signs: Temp: 98.8 F (37.1 C) (11/02 1413) Temp Source: Oral (11/02 1534) BP: 147/83 (11/02 1744) Pulse Rate: 98 (11/02 1744) Intake/Output from previous day: No intake/output data recorded. Intake/Output from this shift: No intake/output data recorded.  Labs:  Recent Labs  02/25/16 1416  WBC 12.9*  HGB 14.8  PLT 134*  CREATININE 1.10   Estimated Creatinine Clearance: 51.5 mL/min (by C-G formula based on SCr of 1.1 mg/dL). No results for input(s): VANCOTROUGH, VANCOPEAK, VANCORANDOM, GENTTROUGH, GENTPEAK, GENTRANDOM, TOBRATROUGH, TOBRAPEAK, TOBRARND, AMIKACINPEAK, AMIKACINTROU, AMIKACIN in the last 72 hours.   Microbiology: No results found for this or any previous visit (from the past 720 hour(s)).  Medical History: Past Medical History:  Diagnosis Date  . Anxiety   . Arthritis   . Blood transfusion without reported diagnosis   . Clotting disorder ([place])   . Depression   . Diabetes mellitus without complication ([place])   . GERD (gastroesophageal reflux disease)   . Hypertension   . Neuromuscular disorder ([place])     Medications:   (Not in a hospital admission) Assessment: CrCl = 51.5 ml/min Ke = 0.047 hr-1 T1/2 = 14.74 hrs Vd = 66.7 L   Goal of Therapy:  Vancomycin trough level 15-20 mcg/ml  Plan:  Expected duration 7 days with resolution of temperature and/or normalization of WBC   Cefepime 2 gm IV Q12H ordered to start on 11/02.  Vancomycin 1 gm IV X 1 given on 11/2. Vancomycin 1 gm IV Q12H ordered to start on 11/3, 6 hrs after 1st dose. Level before 5th dose.   Robbins,Jason D 02/25/2016,9:42 PM

## 2016-02-25 NOTE — ED Notes (Signed)
1 set of blood cultures sent. Pt prefers port to be accessed if he needs IV access.

## 2016-02-26 DIAGNOSIS — T8189XA Other complications of procedures, not elsewhere classified, initial encounter: Secondary | ICD-10-CM | POA: Diagnosis not present

## 2016-02-26 LAB — BASIC METABOLIC PANEL
ANION GAP: 8 (ref 5–15)
BUN: 18 mg/dL (ref 6–20)
CALCIUM: 9.3 mg/dL (ref 8.9–10.3)
CO2: 25 mmol/L (ref 22–32)
Chloride: 103 mmol/L (ref 101–111)
Creatinine, Ser: 1.08 mg/dL (ref 0.61–1.24)
GFR calc Af Amer: >60 mL/min (ref >60)
Glucose, Bld: 277 mg/dL — ABNORMAL HIGH (ref 65–99)
POTASSIUM: 4.3 mmol/L (ref 3.5–5.1)
Sodium: 136 mmol/L (ref 135–145)

## 2016-02-26 LAB — GLUCOSE, CAPILLARY: GLUCOSE-CAPILLARY: 277 mg/dL — AB (ref 65–99)

## 2016-02-26 MED ORDER — METFORMIN HCL 500 MG PO TABS
1000.0000 mg | ORAL_TABLET | Freq: Two times a day (BID) | ORAL | Status: DC
  Administered 2016-02-26 (×2): 1000 mg via ORAL
  Filled 2016-02-26 (×2): qty 2

## 2016-02-26 MED ORDER — PANTOPRAZOLE SODIUM 40 MG PO TBEC
40.0000 mg | DELAYED_RELEASE_TABLET | Freq: Every day | ORAL | Status: DC
  Administered 2016-02-26: 40 mg via ORAL
  Filled 2016-02-26: qty 1

## 2016-02-26 MED ORDER — LINAGLIPTIN 5 MG PO TABS
5.0000 mg | ORAL_TABLET | Freq: Every day | ORAL | Status: DC
  Administered 2016-02-26: 5 mg via ORAL
  Filled 2016-02-26: qty 1

## 2016-02-26 MED ORDER — VANCOMYCIN HCL IN DEXTROSE 1-5 GM/200ML-% IV SOLN
1000.0000 mg | Freq: Two times a day (BID) | INTRAVENOUS | Status: DC
  Administered 2016-02-26: 1000 mg via INTRAVENOUS
  Filled 2016-02-26: qty 200

## 2016-02-26 MED ORDER — LISINOPRIL 5 MG PO TABS
5.0000 mg | ORAL_TABLET | Freq: Every day | ORAL | Status: DC
  Administered 2016-02-26: 5 mg via ORAL
  Filled 2016-02-26: qty 1

## 2016-02-26 MED ORDER — GABAPENTIN 300 MG PO CAPS: 300.0000 mg | ORAL_CAPSULE | Freq: Every day | ORAL | Status: DC

## 2016-02-26 MED ORDER — TAMSULOSIN HCL 0.4 MG PO CAPS
0.4000 mg | ORAL_CAPSULE | Freq: Every day | ORAL | Status: DC
  Administered 2016-02-26: 0.4 mg via ORAL
  Filled 2016-02-26: qty 1

## 2016-02-26 MED ORDER — DULOXETINE HCL 60 MG PO CPEP
60.0000 mg | ORAL_CAPSULE | Freq: Every day | ORAL | Status: DC
  Administered 2016-02-26: 60 mg via ORAL
  Filled 2016-02-26: qty 1

## 2016-02-26 MED ORDER — NICOTINE 21 MG/24HR TD PT24
21.0000 mg | MEDICATED_PATCH | Freq: Once | TRANSDERMAL | Status: DC
  Administered 2016-02-26: 21 mg via TRANSDERMAL

## 2016-02-26 MED ORDER — OXYCODONE HCL ER 10 MG PO T12A
10.0000 mg | EXTENDED_RELEASE_TABLET | Freq: Two times a day (BID) | ORAL | Status: DC
  Administered 2016-02-26: 10 mg via ORAL
  Filled 2016-02-26: qty 1

## 2016-02-26 MED ORDER — PRAVASTATIN SODIUM 40 MG PO TABS
40.0000 mg | ORAL_TABLET | Freq: Every day | ORAL | Status: DC
  Administered 2016-02-26: 40 mg via ORAL
  Filled 2016-02-26: qty 1

## 2016-02-26 MED ORDER — NICOTINE 21 MG/24HR TD PT24
MEDICATED_PATCH | TRANSDERMAL | Status: AC
  Administered 2016-02-26: 21 mg via TRANSDERMAL
  Filled 2016-02-26: qty 1

## 2016-02-26 MED ORDER — CELECOXIB 200 MG PO CAPS
200.0000 mg | ORAL_CAPSULE | Freq: Every day | ORAL | Status: DC | PRN
  Filled 2016-02-26 (×2): qty 1

## 2016-02-26 MED ORDER — ANTIHEM FACTOR RECOMB (RFVIII) 3000 UNITS IV KIT: PACK | Freq: Every day | INTRAVENOUS | Status: DC

## 2016-02-26 NOTE — ED Notes (Signed)
Pt up, in bed, watching tv, drinking coffee.

## 2016-02-26 NOTE — ED Notes (Signed)
Pt wanting to go outside and smoke, instructed not allowed to do so. Pt used wheel chair to wheel himself outside, this RN and 2 other RNs talked to pt about risks of leaving. Pt decided to leave AMA. Would not let RN help remove IV, pt removed IV himself.

## 2016-02-26 NOTE — ED Notes (Signed)
Deniece Ree EDT sent to find patient to remove his IV.  This patient left room while I was with another patient.

## 2016-02-26 NOTE — ED Provider Notes (Signed)
Patient continued to be dissatisfied with the wait for transfer to [facility]. We had reordered all his usual home medications to continue managing his medical issues. He had a nicotine patch on but still wanted to smoke. We checked to see if we had any Nicotrol inhalers but none are available as they've been discontinued at this hospital. The patient remained hemodynamically stable and not in distress, but decided to elope from the emergency room. On his way out the door he did state to the nurse in triage that he would drive himself to [place].   Carrie Mew, MD 02/26/16 1150

## 2016-02-26 NOTE — ED Notes (Signed)
ED bed switched out with hospital bed.

## 2016-02-26 NOTE — ED Notes (Signed)
Patient was talked to coming back into the ER due to the possibility of death due to patient receiving no further treatment.  Patient was told he was not allowed to vape or smoke on campus.  I explained that [facility] was under diversion and they were taking no patients and that he was on a wait list at [facility] and told him that leaving could cost him his life if he did not receive further treatment.  I told him essentially his decision was "his life or a cigarette".  He chose to come back into the ER, and called his wife to not come pick him up.  She was on speaker phone, and was encouraging him to stay here and told him he made the right choice.

## 2016-02-26 NOTE — ED Notes (Signed)
Asked pharmacy tech to review pts meds again, since pt reports they got them wrong.

## 2016-02-26 NOTE — ED Notes (Signed)
Pharmacy tech at bedside 

## 2016-02-26 NOTE — ED Notes (Signed)
Pt told this RN that he wanted his celebrex. Called pharmacy to send up medication. Got medication, gave it to pt and pt refused to take this medication because "at home mine is white, and this is blue." Let pt look at package and still refusing to take medication. Continuing to make comments about how "something is not right with this pharmacy."

## 2016-02-26 NOTE — ED Notes (Signed)
Pt is insisting he be allowed to go outside to vape, and is insisting he was allowed to go earlier by day shift.  He was told by me and Deniece Ree EDT that if he left he would have to start the whole process over again including his place in line at [facility].  This patient is willing to leave AMA to get a vape in.  He was offered a nicotine patch and said "those damn things don't work".  I was called away to another patient room at that point.  I am notifying MD of the escalation of the situation.

## 2016-02-26 NOTE — ED Notes (Signed)
ED Provider at bedside. 

## 2016-02-26 NOTE — ED Notes (Signed)
Dr. Owens Shark asked this tech and RN Shanon Brow to offer the patient an invitation to return to his room from smoking. Dr. Owens Shark asked [place] to convey that the patient would not be allowed to go back out to smoke again, but that he was concerned enough for the patient's safety that he would be willing to let the patient return to his room for antibiotic therapy (and a nicotene patch). We reminded the patient about the danger of returning home without being treated. Shanon Brow and I talked to the patient in detail about the possibility of sepsis, of losing his other leg, and even of death if he does not have his infection treated. He finally relented and called his wife to tell her he didn't need to be picked up after all. She sounded relieved that he had changed his mind, and praised him for making the decision to "stay where (he) need(s) to be." RN Shanon Brow and this tech returned the patient to Room 16.

## 2016-02-26 NOTE — ED Provider Notes (Signed)
-----------------------------------------   1:59 AM on 02/26/2016 -----------------------------------------  As informed by the nursing staff stated that the patient was verbally abusive and demanding to go outside and smoke. Presents to the patient's room and offered a nicotine patch to which she responded and said "does not work to [place] thing". I informed the patient and this is a nonsmoking campus and that I could not allow him to go outside and smoke however that I was willing to provide other sources of nicotine. The patient became verbally abusive then proceeded to leave the emergency department.   Gregor Hams, MD 02/26/16 0200

## 2016-02-26 NOTE — ED Notes (Addendum)
Releasing patient's daily meds per EDP verbal order.

## 2016-03-01 LAB — AEROBIC/ANAEROBIC CULTURE (SURGICAL/DEEP WOUND): SPECIAL REQUESTS: NORMAL

## 2016-03-01 LAB — AEROBIC/ANAEROBIC CULTURE W GRAM STAIN (SURGICAL/DEEP WOUND): Culture: NORMAL

## 2016-03-22 DIAGNOSIS — F411 Generalized anxiety disorder: Secondary | ICD-10-CM | POA: Insufficient documentation

## 2016-03-22 DIAGNOSIS — G3184 Mild cognitive impairment, so stated: Secondary | ICD-10-CM | POA: Insufficient documentation

## 2016-03-22 DIAGNOSIS — G479 Sleep disorder, unspecified: Secondary | ICD-10-CM | POA: Insufficient documentation

## 2016-04-03 DIAGNOSIS — F32A Depression, unspecified: Secondary | ICD-10-CM | POA: Insufficient documentation

## 2016-04-03 DIAGNOSIS — F329 Major depressive disorder, single episode, unspecified: Secondary | ICD-10-CM | POA: Insufficient documentation

## 2016-04-03 DIAGNOSIS — K219 Gastro-esophageal reflux disease without esophagitis: Secondary | ICD-10-CM | POA: Insufficient documentation

## 2016-09-27 ENCOUNTER — Other Ambulatory Visit: Payer: Self-pay

## 2016-09-27 MED ORDER — PRAVASTATIN SODIUM 40 MG PO TABS: 40.0000 mg | ORAL_TABLET | Freq: Every day | ORAL | 0 refills | Status: DC

## 2016-10-20 ENCOUNTER — Other Ambulatory Visit: Payer: Self-pay

## 2016-10-20 MED ORDER — PRAVASTATIN SODIUM 40 MG PO TABS: 40.0000 mg | ORAL_TABLET | Freq: Every day | ORAL | 0 refills | Status: DC

## 2016-10-20 NOTE — Telephone Encounter (Signed)
Requested Prescriptions   Signed Prescriptions Disp Refills  . pravastatin (PRAVACHOL) 40 MG tablet 30 tablet 0    Sig: Take 1 tablet (40 mg total) by mouth daily.    Authorizing Provider: Minna Merritts    Ordering User: Janan Ridge

## 2016-10-24 ENCOUNTER — Telehealth: Payer: Self-pay | Admitting: Cardiovascular Disease

## 2016-10-24 NOTE — Telephone Encounter (Signed)
-----   Message from Janan Ridge, [place] sent at 10/20/2016  1:50 PM EDT ----- Can you try to schedule a follow up appointment with Dr. Rockey Situ. Patient requesting refills but was last seen 10/19/2015.

## 2016-10-24 NOTE — Telephone Encounter (Signed)
Spoke with patient wife  She states she is not sure if Dr Rockey Situ even prescribed any medications and thought they were told for them to call us if they ever needed [place]  Would like to talk to patient and see if he needs to come in  They would like a call back to also know which medication that Dr Rockey Situ is refilling   They were not aware of him even giving patient any medication   Please advise.

## 2016-10-25 MED ORDER — PRAVASTATIN SODIUM 40 MG PO TABS: 40.0000 mg | ORAL_TABLET | Freq: Every day | ORAL | 3 refills | Status: DC

## 2016-10-25 NOTE — Telephone Encounter (Signed)
Spoke w/ pt's wife.  Advised her that pt is to f/u on prn basis and I am sending in refills for his pravastatin.  Advised her that pt will need to come in for periodic f/u appts, but for now to call us if he needs anything.  She is appreciative of the call.

## 2016-10-25 NOTE — Telephone Encounter (Signed)
Please advise 

## 2017-02-15 DIAGNOSIS — Z0289 Encounter for other administrative examinations: Secondary | ICD-10-CM | POA: Insufficient documentation

## 2017-02-17 DIAGNOSIS — G894 Chronic pain syndrome: Secondary | ICD-10-CM | POA: Insufficient documentation

## 2017-02-17 DIAGNOSIS — M2550 Pain in unspecified joint: Secondary | ICD-10-CM | POA: Insufficient documentation

## 2017-03-26 ENCOUNTER — Emergency Department
Admission: EM | Admit: 2017-03-26 | Discharge: 2017-03-26 | Disposition: A | Discharge status: Other Acute Inpatient Hospital | Payer: Medicare Other | Attending: Emergency Medicine | Admitting: Emergency Medicine

## 2017-03-26 ENCOUNTER — Emergency Department: Payer: Medicare Other

## 2017-03-26 DIAGNOSIS — S098XXA Other specified injuries of head, initial encounter: Secondary | ICD-10-CM | POA: Diagnosis not present

## 2017-03-26 DIAGNOSIS — I10 Essential (primary) hypertension: Secondary | ICD-10-CM | POA: Insufficient documentation

## 2017-03-26 DIAGNOSIS — J942 Hemothorax: Secondary | ICD-10-CM | POA: Insufficient documentation

## 2017-03-26 DIAGNOSIS — Y9389 Activity, other specified: Secondary | ICD-10-CM | POA: Insufficient documentation

## 2017-03-26 DIAGNOSIS — R0902 Hypoxemia: Secondary | ICD-10-CM | POA: Insufficient documentation

## 2017-03-26 DIAGNOSIS — J90 Pleural effusion, not elsewhere classified: Secondary | ICD-10-CM | POA: Insufficient documentation

## 2017-03-26 DIAGNOSIS — W050XXA Fall from non-moving wheelchair, initial encounter: Secondary | ICD-10-CM | POA: Insufficient documentation

## 2017-03-26 DIAGNOSIS — Y999 Unspecified external cause status: Secondary | ICD-10-CM | POA: Diagnosis not present

## 2017-03-26 DIAGNOSIS — R0602 Shortness of breath: Secondary | ICD-10-CM | POA: Diagnosis not present

## 2017-03-26 DIAGNOSIS — Z79899 Other long term (current) drug therapy: Secondary | ICD-10-CM | POA: Diagnosis not present

## 2017-03-26 DIAGNOSIS — F17210 Nicotine dependence, cigarettes, uncomplicated: Secondary | ICD-10-CM | POA: Diagnosis not present

## 2017-03-26 DIAGNOSIS — Z7984 Long term (current) use of oral hypoglycemic drugs: Secondary | ICD-10-CM | POA: Diagnosis not present

## 2017-03-26 DIAGNOSIS — R0789 Other chest pain: Secondary | ICD-10-CM | POA: Diagnosis present

## 2017-03-26 DIAGNOSIS — E119 Type 2 diabetes mellitus without complications: Secondary | ICD-10-CM | POA: Diagnosis not present

## 2017-03-26 DIAGNOSIS — Y929 Unspecified place or not applicable: Secondary | ICD-10-CM | POA: Insufficient documentation

## 2017-03-26 DIAGNOSIS — W19XXXA Unspecified fall, initial encounter: Secondary | ICD-10-CM

## 2017-03-26 LAB — CBC
HCT: 43.6 % (ref 40.0–52.0)
HEMOGLOBIN: 14.4 g/dL (ref 13.0–18.0)
MCH: 29.4 pg (ref 26.0–34.0)
MCHC: 33 g/dL (ref 32.0–36.0)
MCV: 89 fL (ref 80.0–100.0)
Platelets: 138 10*3/uL — ABNORMAL LOW (ref 150–440)
RBC: 4.9 MIL/uL (ref 4.40–5.90)
RDW: 15 % — AB (ref 11.5–14.5)
WBC: 9 10*3/uL (ref 3.8–10.6)

## 2017-03-26 LAB — BASIC METABOLIC PANEL
ANION GAP: 7 (ref 5–15)
BUN: 21 mg/dL — ABNORMAL HIGH (ref 6–20)
CALCIUM: 9 mg/dL (ref 8.9–10.3)
CO2: 23 mmol/L (ref 22–32)
Chloride: 106 mmol/L (ref 101–111)
Creatinine, Ser: 1.36 mg/dL — ABNORMAL HIGH (ref 0.61–1.24)
GFR calc non Af Amer: 54 mL/min — ABNORMAL LOW (ref >60)
Glucose, Bld: 189 mg/dL — ABNORMAL HIGH (ref 65–99)
Potassium: 4.7 mmol/L (ref 3.5–5.1)
SODIUM: 136 mmol/L (ref 135–145)

## 2017-03-26 LAB — APTT: APTT: 40 s — AB (ref 24–36)

## 2017-03-26 LAB — ETHANOL: Alcohol, Ethyl (B): <10 mg/dL (ref <10)

## 2017-03-26 LAB — PROTIME-INR
INR: 1
PROTHROMBIN TIME: 13.1 s (ref 11.4–15.2)

## 2017-03-26 LAB — TROPONIN I

## 2017-03-26 LAB — BRAIN NATRIURETIC PEPTIDE: B Natriuretic Peptide: 66 pg/mL (ref 0.0–100.0)

## 2017-03-26 MED ORDER — IOPAMIDOL (ISOVUE-370) INJECTION 76%
75.0000 mL | Freq: Once | INTRAVENOUS | Status: AC | PRN
  Administered 2017-03-26: 75 mL via INTRAVENOUS

## 2017-03-26 MED ORDER — FENTANYL CITRATE (PF) 100 MCG/2ML IJ SOLN
50.0000 ug | Freq: Once | INTRAMUSCULAR | Status: AC
  Administered 2017-03-26: 50 ug via INTRAVENOUS
  Filled 2017-03-26: qty 2

## 2017-03-26 MED ORDER — SODIUM CHLORIDE 0.9 % IV BOLUS (SEPSIS)
500.0000 mL | Freq: Once | INTRAVENOUS | Status: AC
  Administered 2017-03-26: 500 mL via INTRAVENOUS

## 2017-03-26 NOTE — ED Notes (Signed)
Patient increasingly diaphoretic and stating "its hot in here".  Temperature retaken and at 99.6.  MD notified of changes.

## 2017-03-26 NOTE — ED Provider Notes (Addendum)
[facility] Emergency Department Provider Note  ____________________________________________   I have reviewed the triage vital signs and the nursing notes.   HISTORY  Chief Complaint Fall    HPI Shawn Cardenas is a 61 y.o. 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  He was drinking and fell out of his wheelchair.  He drinks twice a year not a normal drinker.  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  He did hit his head he did not pass out.  Patient has pain to the mid left ribs and some shortness of breath since the fall.  He is also been coughing he states.  He denies fever chills nausea or vomiting or abdominal pain he denies any flank pain hematuria or other complaints.  He has not had any headaches.  His family states he is at his baseline.  The pain in his ribs is sharp, worse when he changes position or moves.  Did get his chronic pain medication this morning, and he states that he does not need more pain medication at this moment.   Past Medical History:  Diagnosis Date  . Anxiety   . Arthritis   . Blood transfusion without reported diagnosis   . Clotting disorder ([place])   . Depression   . Diabetes mellitus without complication ([place])   . GERD (gastroesophageal reflux disease)   . Hypertension   . Neuromuscular disorder [facility])     Patient Active Problem List   Diagnosis Date Noted  . Family history of MI (myocardial infarction) 10/19/2015  . Obesity 10/19/2015  . 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 Smoker 10/19/2015  . Hyperlipidemia 10/19/2015  . 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 CHOLECYSTECTOMY      Prior to Admission medications   Medication Sig Start Date End Date Taking? Authorizing Provider   Antihemophilic Factor, Recomb, 3846 UNITS KIT Inject into the vein daily. Halixate or Kogenate 3 times weekly or every 12-24 hours as needed    [provider]  celecoxib (CELEBREX) 200 MG capsule Take 200 mg by mouth 2 (two) times daily.     [provider]  DULoxetine (CYMBALTA) 60 MG capsule Take 60 mg by mouth daily.     [provider]  esomeprazole (NEXIUM) 40 MG capsule Take 40 mg by mouth daily.     [provider]  gabapentin (NEURONTIN) 300 MG capsule Take 300 mg by mouth 3 (three) times daily.     [provider]  lisinopril (PRINIVIL,ZESTRIL) 5 MG tablet Take 5 mg by mouth daily.     [provider]  metFORMIN (GLUCOPHAGE) 1000 MG tablet Take 1,000 mg by mouth 2 (two) times daily with a meal.     [provider]  oxyCODONE (OXYCONTIN) 10 mg 12 hr tablet Take 10 mg by mouth every 12 (twelve) hours.    [provider]  pravastatin (PRAVACHOL) 40 MG tablet Take 1 tablet (40 mg total) by mouth daily. 10/25/16   Minna Merritts, MD  saxagliptin HCl (ONGLYZA) 5 MG TABS tablet Take 5 mg by mouth daily.     [provider]  tamsulosin (FLOMAX) 0.4 MG CAPS capsule Take 0.4 mg by mouth daily.     [provider]  Allergies Aspirin  Family History  Problem Relation Age of Onset  . Heart Problems Mother   . Heart attack Father   . Heart attack Brother   . Heart attack Cousin     Social History Social History   Tobacco Use  . Smoking status: Current Every Day Smoker    Years: 20.00    Types: Cigarettes    Last attempt to quit: 04/24/1978    Years since quitting: 38.9  . Smokeless tobacco: Never Used  Substance Use Topics  . Alcohol use: No  . Drug use: No    Review of Systems Constitutional: No fever/chills Eyes: No visual changes. ENT: No sore throat. No stiff neck no neck pain Cardiovascular: Denies chest pain. Respiratory: Denies shortness of breath. Gastrointestinal:   no  vomiting.  No diarrhea.  No constipation. Genitourinary: Negative for dysuria. Musculoskeletal: Negative lower extremity swelling Skin: Negative for rash. Neurological: Negative for severe headaches, focal weakness or numbness.   ____________________________________________   PHYSICAL EXAM:  VITAL SIGNS: ED Triage Vitals  Enc Vitals Group     BP 03/26/17 1044 (!) 150/82     Pulse Rate 03/26/17 1044 (!) 117     Resp 03/26/17 1044 (!) 22     Temp 03/26/17 1044 100.2 F (37.9 C)     Temp Source 03/26/17 1044 Oral     SpO2 03/26/17 1044 (!) 88 %     Weight --      Height --      Head Circumference --      Peak Flow --      Pain Score 03/26/17 1056 8     Pain Loc --      Pain Edu? --      Excl. in [place]? --     Constitutional: Alert and oriented. Well appearing and in no acute distress. Eyes: Conjunctivae are normal Head: Atraumatic HEENT: No congestion/rhinnorhea. Mucous membranes are moist.  Oropharynx non-erythematous Neck:   Nontender with no meningismus, no masses, no stridor Cardiovascular: Normal rate, regular rhythm. Grossly normal heart sounds.  Good peripheral circulation. Respiratory: Normal respiratory effort.  No retractions. Lungs CTAB Chest: Tender palpation in the mid left back, right ptosis.  Patient states "ouch that the pain right there" and changes position.  No obvious flail chest or crepitus.. Abdominal: Soft and nontender. No distention. No guarding no rebound deep palpation over the spleen is negative for tenderness Back:  There is no focal tenderness or step off.  there is no midline tenderness there are no lesions noted. there is no CVA tenderness Musculoskeletal: No lower extremity tenderness, no upper extremity tenderness. No joint effusions, no DVT signs strong distal pulses no edema Neurologic:  Normal speech and language. No gross focal neurologic deficits are appreciated.  Skin:  Skin is warm, dry and intact. No rash noted. Psychiatric: Mood and  affect are somewhat anxious. Speech and behavior are normal.  ____________________________________________   LABS (all labs ordered are listed, but only abnormal results are displayed)  Labs Reviewed  CBC - Abnormal; Notable for the following components:      Result Value   RDW 15.0 (*)    Platelets 138 (*)    All other components within normal limits  BASIC METABOLIC PANEL - Abnormal; Notable for the following components:   Glucose, Bld 189 (*)    BUN 21 (*)    Creatinine, Ser 1.36 (*)    GFR calc non Af Amer 54 (*)    All other components  within normal limits  APTT - Abnormal; Notable for the following components:   aPTT 40 (*)    All other components within normal limits  TROPONIN I  PROTIME-INR  BRAIN NATRIURETIC PEPTIDE  ETHANOL    Pertinent labs  results that were available during my care of the patient were reviewed by me and considered in my medical decision making (see chart for details). ____________________________________________  EKG  I personally interpreted any EKGs ordered by me or triage Patient has a known right bundle bundle branch block, with a left ventricular fascicular block.  This is what we see today, rate 115 mild tachycardia noted, sinus ____________________________________________  RADIOLOGY  Pertinent labs & imaging results that were available during my care of the patient were reviewed by me and considered in my medical decision making (see chart for details). If possible, patient and/or family made aware of any abnormal findings.  Dg Ribs Unilateral W/chest Left  Result Date: 03/26/2017 CLINICAL DATA:  Diffuse left-sided rib pain after falling from wheelchair 2 days ago. EXAM: LEFT RIBS AND CHEST - 3+ VIEW COMPARISON:  CT chest dated October 19, 2015. FINDINGS: No definite rib fracture or focal rib lesion. Left chest wall port catheter with tip at the cavoatrial junction. The cardiomediastinal silhouette is normal in size. Normal pulmonary  vascularity. No focal consolidation, pleural effusion, or pneumothorax. IMPRESSION: No definite rib fracture.  No active cardiopulmonary disease. Electronically Signed   By: Titus Dubin M.D.   On: 03/26/2017 12:04   Ct Head Wo Contrast  Result Date: 03/26/2017 CLINICAL DATA:  Fall on Friday night while intoxicated. EXAM: CT HEAD WITHOUT CONTRAST TECHNIQUE: Contiguous axial images were obtained from the base of the skull through the vertex without intravenous contrast. COMPARISON:  None. FINDINGS: Brain: No evidence of acute infarction, hemorrhage, hydrocephalus, extra-axial collection or mass lesion/mass effect. Faint calcifications within the pons. Vascular: Atherosclerotic vascular calcification of the carotid siphons. No hyperdense vessel. Skull: Normal. Negative for fracture or focal lesion. Sinuses/Orbits: Mild mucosal thickening involving all paranasal sinuses. The mastoid air cells are clear. No acute orbital abnormality. Other: None. IMPRESSION: 1.  No acute intracranial abnormality. 2. Calcifications within the pons, right of midline. These are nonspecific, but could be related to a capillary telangiectasia, or sequela of prior osmotic demyelination. No surrounding mass effect or edema. Electronically Signed   By: Titus Dubin M.D.   On: 03/26/2017 12:30   Ct Angio Chest Pe W And/or Wo Contrast  Result Date: 03/26/2017 CLINICAL DATA:  Shortness of breath EXAM: CT ANGIOGRAPHY CHEST WITH CONTRAST TECHNIQUE: Multidetector CT imaging of the chest was performed using the standard protocol during bolus administration of intravenous contrast. Multiplanar CT image reconstructions and MIPs were obtained to evaluate the vascular anatomy. CONTRAST:  41m ISOVUE-370 IOPAMIDOL (ISOVUE-370) INJECTION 76% COMPARISON:  10/19/2015 FINDINGS: Cardiovascular: Heart is normal size. Aorta is normal caliber. No filling defects in the pulmonary artery is to suggest pulmonary emboli. Mediastinum/Nodes: No  mediastinal, hilar, or axillary adenopathy. Lungs/Pleura: Small left pleural effusion with left base atelectasis. Scattered ground-glass opacities in the lungs, likely atelectasis or small airways disease/ alveolitis. Upper Abdomen: Nodular contours of the liver compatible with cirrhosis. Musculoskeletal: Chest wall soft tissues are unremarkable. Left Port-A-Cath noted with the tip near the cavoatrial junction. No acute bony abnormality. Review of the MIP images confirms the above findings. IMPRESSION: No evidence of pulmonary embolus. Small left pleural effusion with left base atelectasis. Scattered areas of ground-glass density could reflect atelectasis or mild alveolitis. Electronically Signed  By: Rolm Baptise M.D.   On: 03/26/2017 13:30   ____________________________________________    PROCEDURES  Procedure(s) performed: None  Procedures  Critical Care performed: None  ____________________________________________   INITIAL IMPRESSION / ASSESSMENT AND PLAN / ED COURSE  Pertinent labs & imaging results that were available during my care of the patient were reviewed by me and considered in my medical decision making (see chart for details).  Patient here with hemophilia, presents with pain in his chest wall, no abdominal pain.  Fall was 2 days ago.  He has had low-grade temperature here, and he has been coughing all this is concerning for possible pulmonary contusion.  Chest x-ray was nonspecific so I did a CT scan which does verify most likely this is the diagnosis according to my read also a small effusion which could be blood.  Patient is hemodynamically stable here, heart rate is at this time in the mid 90s.  However his sats were in the 80s upon arrival he is not on home oxygen by that he will need to be admitted.  We do not have factor VIII in this hospital.  I page Dr. Susy Manor for further assessment but I suspect patient likely will need to be transferred to  [facility].  ----------------------------------------- 2:17 PM on 03/26/2017 -----------------------------------------  Discussed with Dr. Susy Manor, she does not feel patient likely is a safe admission here given that we do not factor VIII which I do not think is unreasonable.  Family did offer to bring in their home factor VIII however this is not thought to be safe    ____________________________________________   FINAL CLINICAL IMPRESSION(S) / ED DIAGNOSES  Final diagnoses:  None      This chart was dictated using voice recognition software.  Despite best efforts to proofread,  errors can occur which can change meaning.      Schuyler Amor, MD 03/26/17 1407    Schuyler Amor, MD 03/26/17 [phone]

## 2017-03-26 NOTE — ED Notes (Signed)
Report to ED charge RN Anderson Malta at [facility]

## 2017-03-26 NOTE — ED Notes (Signed)
EMS here to transport

## 2017-03-26 NOTE — ED Triage Notes (Addendum)
Pt presents via POV c/o rib pain on left side. Reports falling while intoxicated on Friday night. Reports some SOB. Pt is in wheelchair chronically and is a right sided LE amputee.

## 2017-03-26 NOTE — ED Notes (Signed)
Patient transported to CT 

## 2017-03-26 NOTE — ED Notes (Signed)
Patient transported to X-ray 

## 2017-03-27 DIAGNOSIS — W19XXXA Unspecified fall, initial encounter: Secondary | ICD-10-CM | POA: Insufficient documentation

## 2017-03-30 MED ORDER — DULOXETINE HCL 60 MG PO CPEP
60.00 mg | ORAL_CAPSULE | ORAL | Status: DC
Start: 2017-03-30 — End: 2017-03-30

## 2017-03-30 MED ORDER — GABAPENTIN 300 MG PO CAPS
300.00 mg | ORAL_CAPSULE | ORAL | Status: DC
Start: 2017-03-29 — End: 2017-03-30

## 2017-03-30 MED ORDER — GENERIC EXTERNAL MEDICATION
Status: DC
Start: ? — End: 2017-03-30

## 2017-03-30 MED ORDER — NICOTINE 21 MG/24HR TD PT24
1.00 | MEDICATED_PATCH | TRANSDERMAL | Status: DC
Start: 2017-03-30 — End: 2017-03-30

## 2017-03-30 MED ORDER — LISINOPRIL 10 MG PO TABS
10.00 mg | ORAL_TABLET | ORAL | Status: DC
Start: 2017-03-30 — End: 2017-03-30

## 2017-03-30 MED ORDER — POLYETHYLENE GLYCOL 3350 17 G PO PACK
17.00 | PACK | ORAL | Status: DC
Start: 2017-03-30 — End: 2017-03-30

## 2017-03-30 MED ORDER — IPRATROPIUM BROMIDE 0.02 % IN SOLN
500.00 | RESPIRATORY_TRACT | Status: DC
Start: 2017-03-29 — End: 2017-03-30

## 2017-03-30 MED ORDER — TAMSULOSIN HCL 0.4 MG PO CAPS
0.40 mg | ORAL_CAPSULE | ORAL | Status: DC
Start: 2017-03-29 — End: 2017-03-30

## 2017-03-30 MED ORDER — DOCUSATE SODIUM 100 MG PO CAPS
100.00 mg | ORAL_CAPSULE | ORAL | Status: DC
Start: 2017-03-30 — End: 2017-03-30

## 2017-03-30 MED ORDER — VARENICLINE TARTRATE 0.5 MG PO TABS
.50 mg | ORAL_TABLET | ORAL | Status: DC
Start: 2017-03-30 — End: 2017-03-30

## 2017-03-30 MED ORDER — METFORMIN HCL 500 MG PO TABS
500.00 mg | ORAL_TABLET | ORAL | Status: DC
Start: 2017-03-29 — End: 2017-03-30

## 2017-03-30 MED ORDER — GENERIC EXTERNAL MEDICATION
3060.00 | Status: DC
Start: 2017-03-29 — End: 2017-03-30

## 2017-03-30 MED ORDER — DEXTROSE 50 % IV SOLN
12.50 | INTRAVENOUS | Status: DC
Start: ? — End: 2017-03-30

## 2017-03-30 MED ORDER — INSULIN REGULAR HUMAN 100 UNIT/ML IJ SOLN
0.00 | INTRAMUSCULAR | Status: DC
Start: 2017-03-29 — End: 2017-03-30

## 2017-03-30 MED ORDER — GLIPIZIDE 5 MG PO TABS
5.00 mg | ORAL_TABLET | ORAL | Status: DC
Start: 2017-03-30 — End: 2017-03-30

## 2017-03-30 MED ORDER — PRAVASTATIN SODIUM 20 MG PO TABS
20.00 mg | ORAL_TABLET | ORAL | Status: DC
Start: 2017-03-29 — End: 2017-03-30

## 2017-03-30 MED ORDER — ALBUTEROL SULFATE (2.5 MG/3ML) 0.083% IN NEBU
2.50 mg | INHALATION_SOLUTION | RESPIRATORY_TRACT | Status: DC
Start: ? — End: 2017-03-30

## 2017-03-30 MED ORDER — ACETAMINOPHEN 325 MG PO TABS
650.00 mg | ORAL_TABLET | ORAL | Status: DC
Start: ? — End: 2017-03-30

## 2017-03-30 MED ORDER — PANTOPRAZOLE SODIUM 40 MG PO TBEC
40.00 mg | DELAYED_RELEASE_TABLET | ORAL | Status: DC
Start: 2017-03-30 — End: 2017-03-30

## 2017-05-03 DIAGNOSIS — M25512 Pain in left shoulder: Secondary | ICD-10-CM | POA: Insufficient documentation

## 2017-09-28 ENCOUNTER — Other Ambulatory Visit: Payer: Self-pay | Admitting: *Deleted

## 2017-09-29 ENCOUNTER — Telehealth: Payer: Self-pay | Admitting: Cardiovascular Disease

## 2017-09-29 NOTE — Telephone Encounter (Signed)
-----   Message from Anselm Pancoast, [place] sent at 09/28/2017  3:18 PM EDT ----- Please contact patient for a follow up.  Thanks, Ivin Booty

## 2017-09-29 NOTE — Telephone Encounter (Signed)
lmov to schedule fu  °

## 2017-10-04 NOTE — Telephone Encounter (Signed)
Lmov for patient to call and schedule appointment ° °

## 2017-10-07 ENCOUNTER — Emergency Department
Admission: EM | Admit: 2017-10-07 | Discharge: 2017-10-07 | Disposition: A | Discharge status: Home / Self Care | Payer: Medicare Other | Attending: Emergency Medicine | Admitting: Emergency Medicine

## 2017-10-07 ENCOUNTER — Encounter: Payer: Self-pay | Admitting: Emergency Medicine

## 2017-10-07 ENCOUNTER — Other Ambulatory Visit: Payer: Self-pay

## 2017-10-07 DIAGNOSIS — X100XXA Contact with hot drinks, initial encounter: Secondary | ICD-10-CM | POA: Insufficient documentation

## 2017-10-07 DIAGNOSIS — I10 Essential (primary) hypertension: Secondary | ICD-10-CM | POA: Insufficient documentation

## 2017-10-07 DIAGNOSIS — Y9389 Activity, other specified: Secondary | ICD-10-CM | POA: Insufficient documentation

## 2017-10-07 DIAGNOSIS — Z79899 Other long term (current) drug therapy: Secondary | ICD-10-CM | POA: Insufficient documentation

## 2017-10-07 DIAGNOSIS — T24202A Burn of second degree of unspecified site of left lower limb, except ankle and foot, initial encounter: Secondary | ICD-10-CM | POA: Insufficient documentation

## 2017-10-07 DIAGNOSIS — Y998 Other external cause status: Secondary | ICD-10-CM | POA: Insufficient documentation

## 2017-10-07 DIAGNOSIS — Z7984 Long term (current) use of oral hypoglycemic drugs: Secondary | ICD-10-CM | POA: Diagnosis not present

## 2017-10-07 DIAGNOSIS — Z23 Encounter for immunization: Secondary | ICD-10-CM | POA: Insufficient documentation

## 2017-10-07 DIAGNOSIS — E119 Type 2 diabetes mellitus without complications: Secondary | ICD-10-CM | POA: Insufficient documentation

## 2017-10-07 DIAGNOSIS — Y929 Unspecified place or not applicable: Secondary | ICD-10-CM | POA: Insufficient documentation

## 2017-10-07 DIAGNOSIS — F17210 Nicotine dependence, cigarettes, uncomplicated: Secondary | ICD-10-CM | POA: Insufficient documentation

## 2017-10-07 DIAGNOSIS — Z7902 Long term (current) use of antithrombotics/antiplatelets: Secondary | ICD-10-CM | POA: Insufficient documentation

## 2017-10-07 DIAGNOSIS — T24032A Burn of unspecified degree of left lower leg, initial encounter: Secondary | ICD-10-CM | POA: Diagnosis present

## 2017-10-07 DIAGNOSIS — M009 Pyogenic arthritis, unspecified: Secondary | ICD-10-CM

## 2017-10-07 MED ORDER — SILVER SULFADIAZINE 1 % EX CREA
TOPICAL_CREAM | CUTANEOUS | Status: AC
  Filled 2017-10-07: qty 85

## 2017-10-07 MED ORDER — TETANUS-DIPHTH-ACELL PERTUSSIS 5-2.5-18.5 LF-MCG/0.5 IM SUSP
0.5000 mL | Freq: Once | INTRAMUSCULAR | Status: AC
  Administered 2017-10-07: 0.5 mL via INTRAMUSCULAR
  Filled 2017-10-07: qty 0.5

## 2017-10-07 MED ORDER — SILVER SULFADIAZINE 1 % EX CREA
TOPICAL_CREAM | Freq: Once | CUTANEOUS | Status: AC
  Administered 2017-10-07: 1 via TOPICAL

## 2017-10-07 MED ORDER — SULFAMETHOXAZOLE-TRIMETHOPRIM 800-160 MG PO TABS: 1.0000 | ORAL_TABLET | Freq: Two times a day (BID) | ORAL | 0 refills | Status: DC

## 2017-10-07 NOTE — ED Triage Notes (Signed)
Spilled boiling water on L leg 2 days ago. Is currently covered with dressing but states it blistered up and peeled.

## 2017-10-07 NOTE — ED Notes (Signed)
Pt states he is ready to go, informed Dr. Joni Fears. Pt given cup of water.

## 2017-10-07 NOTE — ED Triage Notes (Signed)
First Nurse Note:  C/O spilling boiling water to left lower leg 2 days ago.  Here to have wound checked out.

## 2017-10-07 NOTE — ED Provider Notes (Signed)
[facility] [place] Emergency Department Provider Note  ____________________________________________  Time seen: Approximately 11:39 AM  I have reviewed the triage vital signs and the nursing notes.   HISTORY  Chief Complaint Burn    HPI Shawn Cardenas is a 62 y.o. 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  He applied Silvadene that he had leftover from family members previous injury and dressed the area with nonadherent gauze and tape.  Not painful.  No fevers or chills.  He notes extensive blistering over the area.   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  He status post fusion of the left knee.    Past Medical History:  Diagnosis Date  . Anxiety   . Arthritis   . Blood transfusion without reported diagnosis   . Clotting disorder ([place])   . Depression   . Diabetes mellitus without complication ([place])   . GERD (gastroesophageal reflux disease)   . Hypertension   . Neuromuscular disorder [facility])      Patient Active Problem List   Diagnosis Date Noted  . Family history of MI (myocardial infarction) 10/19/2015  . Obesity 10/19/2015  . 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 Smoker 10/19/2015  . Hyperlipidemia 10/19/2015  . 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 CHOLECYSTECTOMY    . LEG AMPUTATION Right      Prior to Admission medications   Medication Sig Start Date End Date Taking? Authorizing Provider  Antihemophilic Factor, Recomb, 4034 UNITS KIT Inject into the vein daily. Halixate or Kogenate 3 times weekly or every 12-24 hours as needed    [provider]  celecoxib (CELEBREX) 200 MG capsule Take 200 mg by  mouth daily.     [provider]  cephALEXin (KEFLEX) 500 MG capsule Take 1 capsule by mouth 2 (two) times daily. 03/09/17   [provider]  CHANTIX 1 MG tablet Take 1 tablet by mouth 2 (two) times daily. 03/15/17   [provider]  DULoxetine (CYMBALTA) 60 MG capsule Take 60 mg by mouth daily.     [provider]  esomeprazole (NEXIUM) 40 MG capsule Take 40 mg by mouth daily.     [provider]  gabapentin (NEURONTIN) 300 MG capsule Take 300 mg by mouth 3 (three) times daily.     [provider]  glipiZIDE (GLUCOTROL XL) 10 MG 24 hr tablet Take 1 tablet by mouth daily. 03/09/17   [provider]  lisinopril (PRINIVIL,ZESTRIL) 10 MG tablet Take 1 tablet by mouth daily. 03/09/17   [provider]  metFORMIN (GLUCOPHAGE) 1000 MG tablet Take 1,000 mg by mouth 2 (two) times daily with a meal.     [provider]  NARCAN 4 MG/0.1ML LIQD nasal spray kit Place 1 spray into both nostrils every 5 (five) minutes as needed. 03/14/17   [provider]  oxyCODONE (OXYCONTIN) 10 mg 12 hr tablet Take 10 mg by mouth every 12 (twelve) hours.    [provider]  pravastatin (PRAVACHOL) 40 MG tablet Take 1 tablet (40 mg total) by mouth daily. 10/25/16   Minna Merritts, MD  saxagliptin HCl (ONGLYZA) 5 MG TABS tablet Take 5 mg by mouth  daily.     [provider]  sulfamethoxazole-trimethoprim (BACTRIM DS) 800-160 MG tablet Take 1 tablet by mouth 2 (two) times daily. 10/07/17   Carrie Mew, MD  tamsulosin (FLOMAX) 0.4 MG CAPS capsule Take 0.4 mg by mouth daily.     [provider]     Allergies Aspirin   Family History  Problem Relation Age of Onset  . Heart Problems Mother   . Heart attack Father   . Heart attack Brother   . Heart attack Cousin     Social History Social History   Tobacco Use  . Smoking status: Current Every Day Smoker    Packs/day: 2.00    Years: 20.00    Pack  years: 40.00    Types: Cigarettes    Last attempt to quit: 04/24/1978    Years since quitting: 39.4  . Smokeless tobacco: Never Used  Substance Use Topics  . Alcohol use: No  . Drug use: No    Review of Systems  Constitutional:   No fever or chills.  ENT:   No sore throat. No rhinorrhea. Cardiovascular:   No chest pain or syncope. Respiratory:   No dyspnea or cough. Gastrointestinal:   Negative for abdominal pain, vomiting and diarrhea.  Musculoskeletal:   Negative for focal pain or swelling.  Positive blistering over the left knee as above All other systems reviewed and are negative except as documented above in ROS and HPI.  ____________________________________________   PHYSICAL EXAM:  VITAL SIGNS: ED Triage Vitals  Enc Vitals Group     BP 10/07/17 1003 139/68     Pulse Rate 10/07/17 1003 (!) 104     Resp 10/07/17 1003 18     Temp 10/07/17 1003 98.9 F (37.2 C)     Temp Source 10/07/17 1003 Oral     SpO2 10/07/17 1003 96 %     Weight 10/07/17 1005 230 lb (104.3 kg)     Height 10/07/17 1005 '5\' 8"'$  (1.727 m)     Head Circumference --      Peak Flow --      Pain Score 10/07/17 1005 0     Pain Loc --      Pain Edu? --      Excl. in [place]? --     Vital signs reviewed, nursing assessments reviewed.   Constitutional:   Alert and oriented. Non-toxic appearance. Eyes:   Conjunctivae are normal. EOMI. PERRL. ENT      Head:   Normocephalic and atraumatic.      Nose:   No congestion/rhinnorhea.       Mouth/Throat:   MMM, no pharyngeal erythema. No peritonsillar mass.       Neck:   No meningismus. Full ROM. Hematological/Lymphatic/Immunilogical:   No cervical lymphadenopathy. Cardiovascular:   RRR. Symmetric bilateral radial and DP pulses.  No murmurs.  Respiratory:   Normal respiratory effort without tachypnea/retractions. Breath sounds are clear and equal bilaterally. No wheezes/rales/rhonchi. Gastrointestinal:   Soft and nontender. Non distended. There is no CVA  tenderness.  No rebound, rigidity, or guarding.  Musculoskeletal:   Status post right AKA.  Left knee fused.  Normal range of motion in left hip and ankle.  No tenderness.  There is second-degree burn with blistering over the left knee anteriorly, covering about 180 degrees of the leg.  Total body surface area 2%.  Wound blanches and has sensation, consistent with partial thickness.  At the superior aspect of the burn there is a draining sinus from  his chronic infection that he takes Keflex for.  Neurologic:   Normal speech and language.  Motor grossly intact. No acute focal neurologic deficits are appreciated.  Skin:    Skin is warm, dry with burn as above ____________________________________________    LABS (pertinent positives/negatives) (all labs ordered are listed, but only abnormal results are displayed) Labs Reviewed - No data to display ____________________________________________   EKG    ____________________________________________    RADIOLOGY  No results found.  ____________________________________________   PROCEDURES .Burn Treatment Date/Time: 10/07/2017 11:56 AM Performed by: Carrie Mew, MD Authorized by: Carrie Mew, MD   Consent:    Consent obtained:  Verbal   Consent given by:  Patient   Risks discussed:  Pain   Alternatives discussed:  No treatment Procedure details:    Total body burn percentage - partial/full:  2   Escharotomy performed: no   Burn area 1 details:    Burn depth:  Partial thickness (2nd)   Affected area:  Lower extremity   Lower extremity location:  L leg   Debridement performed: yes     Debridement mechanism:  Gauze   Indications for debridement: adherent debris, devitalized blisters, devitalized skin, intact blisters and ruptured blisters     Wound base:  Pink   Wound treatment:  Silver sulfadiazine   Dressing:  Non-stick sterile dressing (gauze wrap) Post-procedure details:    Patient tolerance of procedure:   Tolerated well, no immediate complications    ____________________________________________    CLINICAL IMPRESSION / ASSESSMENT AND PLAN / ED COURSE  Pertinent labs & imaging results that were available during my care of the patient were reviewed by me and considered in my medical decision making (see chart for details).    Patient presents with partial thickness burn to left knee.  Overall small, burn wound care provided to minimize risk of infection.  Start the patient on Bactrim.  He again says that he is only taking his Keflex 1 time a day even though his doctor wants him to take it 2 times a day.  I reiterated to him that it is important to take it 2 times a day for it to be effective.  Advise close follow-up with infectious disease and burn clinic.   Tetanus vaccine given     ____________________________________________   FINAL CLINICAL IMPRESSION(S) / ED DIAGNOSES    Final diagnoses:  Burn of left leg, second degree, initial encounter  Chronic infection of knee [facility]     ED Discharge Orders        Ordered    sulfamethoxazole-trimethoprim (BACTRIM DS) 800-160 MG tablet  2 times daily     10/07/17 1139      Portions of this note were generated with dragon dictation software. Dictation errors may occur despite best attempts at proofreading.    Carrie Mew, MD 10/07/17 1157

## 2017-10-07 NOTE — Discharge Instructions (Addendum)
Take Bactrim 2 times a day as prescribed.  It is also important for you to take your Keflex 2 times a day as prescribed by your doctor.  Follow-up with [facility] burn clinic in 2 days for continued monitoring of your left leg burn.  Follow-up with your infectious disease doctor as well for continued monitoring of your symptoms.

## 2017-10-07 NOTE — ED Notes (Addendum)
Pt refusing to wait for paperwork and started wheeling out on wheel chair. Shawn Main, RN grabbed discharge paperwork and went over it in the hallway with patient. MD aware. Refused to stop for vital signs.

## 2017-10-07 NOTE — ED Notes (Signed)
Pt reports 2 days ago he spilled boiling water down his left leg near the knee.  Dead tissue noted around knee.  Pt states he has been using silver nitrate and neosporin to area and keeping it wrapped.  Pt is taking keflex daily for chronic leg infection, however he is supposed to be taking twice a day.  Pt denies any pain at this time. Pt drove himself to ED.

## 2017-10-09 DIAGNOSIS — T24322A Burn of third degree of left knee, initial encounter: Secondary | ICD-10-CM | POA: Insufficient documentation

## 2017-10-12 NOTE — Telephone Encounter (Signed)
Spoke to patient wife Patient is in hospital Will get refills from doctors in hospital

## 2017-11-01 ENCOUNTER — Other Ambulatory Visit: Payer: Self-pay | Admitting: *Deleted

## 2017-11-01 MED ORDER — PRAVASTATIN SODIUM 40 MG PO TABS: 40.0000 mg | ORAL_TABLET | Freq: Every day | ORAL | 0 refills | Status: DC

## 2017-11-16 DIAGNOSIS — I2510 Atherosclerotic heart disease of native coronary artery without angina pectoris: Secondary | ICD-10-CM | POA: Insufficient documentation
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
## 2017-11-17 ENCOUNTER — Encounter: Payer: Self-pay | Admitting: Cardiovascular Disease

## 2017-11-17 ENCOUNTER — Ambulatory Visit (INDEPENDENT_AMBULATORY_CARE_PROVIDER_SITE_OTHER): Payer: Medicare Other | Admitting: Cardiovascular Disease

## 2017-11-17 VITALS — BP 116/60 | HR 94

## 2017-11-17 DIAGNOSIS — E782 Mixed hyperlipidemia: Secondary | ICD-10-CM

## 2017-11-17 DIAGNOSIS — I2510 Atherosclerotic heart disease of native coronary artery without angina pectoris: Secondary | ICD-10-CM | POA: Diagnosis not present

## 2017-11-17 DIAGNOSIS — D66 Hereditary factor VIII deficiency: Secondary | ICD-10-CM | POA: Diagnosis not present

## 2017-11-17 DIAGNOSIS — E11618 Type 2 diabetes mellitus with other diabetic arthropathy: Secondary | ICD-10-CM

## 2017-11-17 DIAGNOSIS — F17200 Nicotine dependence, unspecified, uncomplicated: Secondary | ICD-10-CM

## 2017-11-17 NOTE — Patient Instructions (Signed)

## 2017-11-29 ENCOUNTER — Telehealth: Payer: Self-pay

## 2017-11-29 MED ORDER — PRAVASTATIN SODIUM 40 MG PO TABS: 40.0000 mg | ORAL_TABLET | Freq: Every day | ORAL | 3 refills | Status: DC

## 2017-11-29 NOTE — Telephone Encounter (Signed)
Pravastatin sent for a refill to [facility]. 30 day supply with 3 refills.

## 2018-01-26 ENCOUNTER — Telehealth: Payer: Self-pay | Admitting: Cardiovascular Disease

## 2018-01-26 NOTE — Telephone Encounter (Signed)
Pt c/o Shortness Of Breath: STAT if SOB developed within the last 24 hours or pt is noticeably SOB on the phone  1. Are you currently SOB (can you hear that pt is SOB on the phone)? NO   2. How long have you been experiencing SOB? Since June  3. Are you SOB when sitting or when up moving around? Pt is in wheeled chair  4. Are you currently experiencing any other symptoms?  no
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
## 2018-01-27 NOTE — Telephone Encounter (Signed)
I am concerned about his breath and oxygen level when flat Smoker 2 ppd, May need to talk with pulmonary and set up for night oxymetry  Only other option would be fluid, could check a BNP

## 2018-01-29 NOTE — Telephone Encounter (Signed)
Spoke with patient and he states that he saw Dr. Raul Del with pulmonology back on 01/25/18. Advised that would be who he should contact with any continued symptoms and that Dr. Rockey Situ thought he may want to talk with them about a overnight pulse oximetry test and/or labs to check fluid status. Patient then reviewed that he did see Dr. Raul Del and that given his smoking that he is aware these symptoms are probably from that. Recommended that he contact Dr. Raul Del if his symptoms persist or worsen. He verbalized understanding with no further questions at this time.

## 2018-02-18 ENCOUNTER — Encounter: Payer: Self-pay | Admitting: Emergency Medicine

## 2018-02-18 ENCOUNTER — Emergency Department: Payer: Medicare Other

## 2018-02-18 ENCOUNTER — Emergency Department
Admission: EM | Admit: 2018-02-18 | Discharge: 2018-02-18 | Disposition: A | Discharge status: Home / Self Care | Payer: Medicare Other | Attending: Emergency Medicine | Admitting: Emergency Medicine

## 2018-02-18 ENCOUNTER — Other Ambulatory Visit: Payer: Self-pay

## 2018-02-18 DIAGNOSIS — R079 Chest pain, unspecified: Secondary | ICD-10-CM | POA: Diagnosis not present

## 2018-02-18 DIAGNOSIS — Z5321 Procedure and treatment not carried out due to patient leaving prior to being seen by health care provider: Secondary | ICD-10-CM | POA: Diagnosis not present

## 2018-02-18 LAB — CBC
HCT: 36.5 % — ABNORMAL LOW (ref 39.0–52.0)
Hemoglobin: 11.5 g/dL — ABNORMAL LOW (ref 13.0–17.0)
MCH: 26.1 pg (ref 26.0–34.0)
MCHC: 31.5 g/dL (ref 30.0–36.0)
MCV: 82.8 fL (ref 80.0–100.0)
NRBC: 0 % (ref 0.0–0.2)
PLATELETS: 238 10*3/uL (ref 150–400)
RBC: 4.41 MIL/uL (ref 4.22–5.81)
RDW: 16.5 % — AB (ref 11.5–15.5)
WBC: 11.6 10*3/uL — ABNORMAL HIGH (ref 4.0–10.5)

## 2018-02-18 LAB — TROPONIN I: Troponin I: <0.03 ng/mL (ref <0.03)

## 2018-02-18 LAB — BASIC METABOLIC PANEL
ANION GAP: 8 (ref 5–15)
BUN: 18 mg/dL (ref 8–23)
CALCIUM: 8.8 mg/dL — AB (ref 8.9–10.3)
CO2: 25 mmol/L (ref 22–32)
Chloride: 106 mmol/L (ref 98–111)
Creatinine, Ser: 1.3 mg/dL — ABNORMAL HIGH (ref 0.61–1.24)
GFR, EST NON AFRICAN AMERICAN: 57 mL/min — AB (ref >60)
Glucose, Bld: 92 mg/dL (ref 70–99)
Potassium: 4.2 mmol/L (ref 3.5–5.1)
SODIUM: 139 mmol/L (ref 135–145)

## 2018-02-18 NOTE — ED Triage Notes (Signed)
Pt to ED via POV c/o chest pain and shortness of breath that started about 2 hours PTA. Pt states that the pain radiates down into his left arm and he feels like there is someone sitting on his chest. Pt is diaphoretic in triage.

## 2018-02-20 ENCOUNTER — Telehealth: Payer: Self-pay | Admitting: Emergency Medicine

## 2018-02-20 NOTE — Telephone Encounter (Signed)
Called patient due to lwot to inquire about condition and follow up plans. Left message.   

## 2018-02-27 ENCOUNTER — Telehealth: Payer: Self-pay

## 2018-02-27 MED ORDER — PRAVASTATIN SODIUM 40 MG PO TABS: 40.0000 mg | ORAL_TABLET | Freq: Every day | ORAL | 3 refills | Status: DC

## 2018-02-27 NOTE — Telephone Encounter (Signed)
Pt requested refill of pravastatin 40 mg sent to [facility].

## 2018-05-22 ENCOUNTER — Other Ambulatory Visit: Payer: Self-pay | Admitting: Gastroenterology

## 2018-05-22 ENCOUNTER — Ambulatory Visit
Admission: RE | Admit: 2018-05-22 | Discharge: 2018-05-22 | Disposition: A | Discharge status: Home / Self Care | Payer: Medicare Other | Source: Ambulatory Visit | Attending: Gastroenterology | Admitting: Gastroenterology

## 2018-05-22 DIAGNOSIS — R1032 Left lower quadrant pain: Secondary | ICD-10-CM

## 2018-05-22 DIAGNOSIS — R1013 Epigastric pain: Secondary | ICD-10-CM | POA: Insufficient documentation

## 2018-05-22 DIAGNOSIS — R1031 Right lower quadrant pain: Secondary | ICD-10-CM | POA: Insufficient documentation

## 2018-05-22 MED ORDER — IOHEXOL 300 MG/ML  SOLN
100.0000 mL | Freq: Once | INTRAMUSCULAR | Status: AC | PRN
  Administered 2018-05-22: 100 mL via INTRAVENOUS

## 2018-05-23 ENCOUNTER — Other Ambulatory Visit: Payer: Self-pay

## 2018-05-23 ENCOUNTER — Other Ambulatory Visit: Payer: Self-pay | Admitting: Gastroenterology

## 2018-05-23 ENCOUNTER — Inpatient Hospital Stay
Admit: 2018-05-23 | Discharge: 2018-05-23 | Disposition: A | Discharge status: Home / Self Care | Payer: Medicare Other | Attending: Internal Medicine | Admitting: Internal Medicine

## 2018-05-23 ENCOUNTER — Inpatient Hospital Stay
Admission: EM | Admit: 2018-05-23 | Discharge: 2018-05-31 | DRG: 438 | Disposition: A | Discharge status: Home / Self Care | Payer: Medicare Other | Attending: Internal Medicine | Admitting: Internal Medicine

## 2018-05-23 ENCOUNTER — Encounter: Payer: Self-pay | Admitting: Emergency Medicine

## 2018-05-23 DIAGNOSIS — K7460 Unspecified cirrhosis of liver: Secondary | ICD-10-CM | POA: Diagnosis present

## 2018-05-23 DIAGNOSIS — B1920 Unspecified viral hepatitis C without hepatic coma: Secondary | ICD-10-CM | POA: Diagnosis present

## 2018-05-23 DIAGNOSIS — E1122 Type 2 diabetes mellitus with diabetic chronic kidney disease: Secondary | ICD-10-CM | POA: Diagnosis present

## 2018-05-23 DIAGNOSIS — Z9049 Acquired absence of other specified parts of digestive tract: Secondary | ICD-10-CM

## 2018-05-23 DIAGNOSIS — K831 Obstruction of bile duct: Secondary | ICD-10-CM | POA: Diagnosis present

## 2018-05-23 DIAGNOSIS — G894 Chronic pain syndrome: Secondary | ICD-10-CM | POA: Diagnosis present

## 2018-05-23 DIAGNOSIS — E669 Obesity, unspecified: Secondary | ICD-10-CM | POA: Diagnosis present

## 2018-05-23 DIAGNOSIS — R188 Other ascites: Secondary | ICD-10-CM | POA: Diagnosis present

## 2018-05-23 DIAGNOSIS — Z23 Encounter for immunization: Secondary | ICD-10-CM | POA: Diagnosis present

## 2018-05-23 DIAGNOSIS — I5033 Acute on chronic diastolic (congestive) heart failure: Secondary | ICD-10-CM | POA: Diagnosis present

## 2018-05-23 DIAGNOSIS — F419 Anxiety disorder, unspecified: Secondary | ICD-10-CM | POA: Diagnosis present

## 2018-05-23 DIAGNOSIS — K219 Gastro-esophageal reflux disease without esophagitis: Secondary | ICD-10-CM | POA: Diagnosis present

## 2018-05-23 DIAGNOSIS — N179 Acute kidney failure, unspecified: Secondary | ICD-10-CM | POA: Diagnosis present

## 2018-05-23 DIAGNOSIS — K869 Disease of pancreas, unspecified: Secondary | ICD-10-CM | POA: Diagnosis not present

## 2018-05-23 DIAGNOSIS — J449 Chronic obstructive pulmonary disease, unspecified: Secondary | ICD-10-CM | POA: Diagnosis present

## 2018-05-23 DIAGNOSIS — W050XXA Fall from non-moving wheelchair, initial encounter: Secondary | ICD-10-CM | POA: Diagnosis not present

## 2018-05-23 DIAGNOSIS — Y92239 Unspecified place in hospital as the place of occurrence of the external cause: Secondary | ICD-10-CM | POA: Diagnosis not present

## 2018-05-23 DIAGNOSIS — G629 Polyneuropathy, unspecified: Secondary | ICD-10-CM | POA: Diagnosis present

## 2018-05-23 DIAGNOSIS — N183 Chronic kidney disease, stage 3 (moderate): Secondary | ICD-10-CM | POA: Diagnosis present

## 2018-05-23 DIAGNOSIS — M1990 Unspecified osteoarthritis, unspecified site: Secondary | ICD-10-CM | POA: Diagnosis present

## 2018-05-23 DIAGNOSIS — Z923 Personal history of irradiation: Secondary | ICD-10-CM

## 2018-05-23 DIAGNOSIS — K859 Acute pancreatitis without necrosis or infection, unspecified: Secondary | ICD-10-CM

## 2018-05-23 DIAGNOSIS — Z79891 Long term (current) use of opiate analgesic: Secondary | ICD-10-CM

## 2018-05-23 DIAGNOSIS — Z6833 Body mass index (BMI) 33.0-33.9, adult: Secondary | ICD-10-CM

## 2018-05-23 DIAGNOSIS — D689 Coagulation defect, unspecified: Secondary | ICD-10-CM | POA: Diagnosis present

## 2018-05-23 DIAGNOSIS — H9191 Unspecified hearing loss, right ear: Secondary | ICD-10-CM | POA: Diagnosis present

## 2018-05-23 DIAGNOSIS — M359 Systemic involvement of connective tissue, unspecified: Secondary | ICD-10-CM | POA: Diagnosis not present

## 2018-05-23 DIAGNOSIS — I130 Hypertensive heart and chronic kidney disease with heart failure and stage 1 through stage 4 chronic kidney disease, or unspecified chronic kidney disease: Secondary | ICD-10-CM | POA: Diagnosis present

## 2018-05-23 DIAGNOSIS — F17210 Nicotine dependence, cigarettes, uncomplicated: Secondary | ICD-10-CM | POA: Diagnosis present

## 2018-05-23 DIAGNOSIS — R1013 Epigastric pain: Secondary | ICD-10-CM

## 2018-05-23 DIAGNOSIS — R748 Abnormal levels of other serum enzymes: Secondary | ICD-10-CM

## 2018-05-23 DIAGNOSIS — D66 Hereditary factor VIII deficiency: Secondary | ICD-10-CM | POA: Diagnosis present

## 2018-05-23 DIAGNOSIS — F329 Major depressive disorder, single episode, unspecified: Secondary | ICD-10-CM | POA: Diagnosis present

## 2018-05-23 DIAGNOSIS — Z7984 Long term (current) use of oral hypoglycemic drugs: Secondary | ICD-10-CM

## 2018-05-23 DIAGNOSIS — E11649 Type 2 diabetes mellitus with hypoglycemia without coma: Secondary | ICD-10-CM | POA: Diagnosis present

## 2018-05-23 DIAGNOSIS — K838 Other specified diseases of biliary tract: Secondary | ICD-10-CM

## 2018-05-23 DIAGNOSIS — Z89511 Acquired absence of right leg below knee: Secondary | ICD-10-CM

## 2018-05-23 DIAGNOSIS — C7889 Secondary malignant neoplasm of other digestive organs: Secondary | ICD-10-CM | POA: Diagnosis not present

## 2018-05-23 DIAGNOSIS — Z993 Dependence on wheelchair: Secondary | ICD-10-CM

## 2018-05-23 DIAGNOSIS — Z885 Allergy status to narcotic agent status: Secondary | ICD-10-CM

## 2018-05-23 DIAGNOSIS — Z79899 Other long term (current) drug therapy: Secondary | ICD-10-CM

## 2018-05-23 DIAGNOSIS — Z886 Allergy status to analgesic agent status: Secondary | ICD-10-CM

## 2018-05-23 DIAGNOSIS — Z89611 Acquired absence of right leg above knee: Secondary | ICD-10-CM

## 2018-05-23 DIAGNOSIS — R0789 Other chest pain: Secondary | ICD-10-CM

## 2018-05-23 DIAGNOSIS — Z8249 Family history of ischemic heart disease and other diseases of the circulatory system: Secondary | ICD-10-CM

## 2018-05-23 DIAGNOSIS — R079 Chest pain, unspecified: Secondary | ICD-10-CM

## 2018-05-23 DIAGNOSIS — R0602 Shortness of breath: Secondary | ICD-10-CM

## 2018-05-23 DIAGNOSIS — R6881 Early satiety: Secondary | ICD-10-CM

## 2018-05-23 DIAGNOSIS — R0781 Pleurodynia: Secondary | ICD-10-CM

## 2018-05-23 LAB — CBC
HCT: 38.9 % — ABNORMAL LOW (ref 39.0–52.0)
Hemoglobin: 12.1 g/dL — ABNORMAL LOW (ref 13.0–17.0)
MCH: 25.3 pg — ABNORMAL LOW (ref 26.0–34.0)
MCHC: 31.1 g/dL (ref 30.0–36.0)
MCV: 81.2 fL (ref 80.0–100.0)
NRBC: 0 % (ref 0.0–0.2)
Platelets: 176 10*3/uL (ref 150–400)
RBC: 4.79 MIL/uL (ref 4.22–5.81)
RDW: 18.1 % — ABNORMAL HIGH (ref 11.5–15.5)
WBC: 9.4 10*3/uL (ref 4.0–10.5)

## 2018-05-23 LAB — LIPID PANEL
CHOL/HDL RATIO: 3.4 ratio
Cholesterol: 112 mg/dL (ref 0–200)
Cholesterol: 112 mg/dL (ref 0–200)
HDL: 33 mg/dL — ABNORMAL LOW (ref >40)
HDL: 30 mg/dL — ABNORMAL LOW (ref >40)
LDL Cholesterol: 68 mg/dL (ref 0–99)
LDL Cholesterol: 73 mg/dL (ref 0–99)
Total CHOL/HDL Ratio: 3.7 RATIO
Triglycerides: 53 mg/dL (ref <150)
Triglycerides: 47 mg/dL (ref <150)
VLDL: 11 mg/dL (ref 0–40)
VLDL: 9 mg/dL (ref 0–40)

## 2018-05-23 LAB — COMPREHENSIVE METABOLIC PANEL
ALT: 122 U/L — ABNORMAL HIGH (ref 0–44)
ANION GAP: 9 (ref 5–15)
AST: 94 U/L — ABNORMAL HIGH (ref 15–41)
Albumin: 3.5 g/dL (ref 3.5–5.0)
Alkaline Phosphatase: 242 U/L — ABNORMAL HIGH (ref 38–126)
BILIRUBIN TOTAL: 1.9 mg/dL — AB (ref 0.3–1.2)
BUN: 29 mg/dL — ABNORMAL HIGH (ref 8–23)
CO2: 24 mmol/L (ref 22–32)
Calcium: 8.7 mg/dL — ABNORMAL LOW (ref 8.9–10.3)
Chloride: 103 mmol/L (ref 98–111)
Creatinine, Ser: 1.51 mg/dL — ABNORMAL HIGH (ref 0.61–1.24)
GFR calc Af Amer: 56 mL/min — ABNORMAL LOW (ref >60)
GFR, EST NON AFRICAN AMERICAN: 48 mL/min — AB (ref >60)
Glucose, Bld: 116 mg/dL — ABNORMAL HIGH (ref 70–99)
POTASSIUM: 4 mmol/L (ref 3.5–5.1)
Sodium: 136 mmol/L (ref 135–145)
TOTAL PROTEIN: 7 g/dL (ref 6.5–8.1)

## 2018-05-23 LAB — LIPASE, BLOOD: LIPASE: 664 U/L — AB (ref 11–51)

## 2018-05-23 LAB — TSH: TSH: 2.084 u[IU]/mL (ref 0.350–4.500)

## 2018-05-23 LAB — GLUCOSE, CAPILLARY
Glucose-Capillary: 126 mg/dL — ABNORMAL HIGH (ref 70–99)
Glucose-Capillary: 72 mg/dL (ref 70–99)

## 2018-05-23 LAB — TROPONIN I: TROPONIN I: 0.05 ng/mL — AB (ref <0.03)

## 2018-05-23 LAB — HEMOGLOBIN A1C
HEMOGLOBIN A1C: 5.7 % — AB (ref 4.8–5.6)
Mean Plasma Glucose: 116.89 mg/dL

## 2018-05-23 MED ORDER — GLIPIZIDE ER 10 MG PO TB24
10.0000 mg | ORAL_TABLET | Freq: Every day | ORAL | Status: DC
  Administered 2018-05-23 – 2018-05-24 (×2): 10 mg via ORAL
  Filled 2018-05-23 (×2): qty 1

## 2018-05-23 MED ORDER — MORPHINE SULFATE (PF) 2 MG/ML IV SOLN
2.0000 mg | INTRAVENOUS | Status: DC | PRN
  Administered 2018-05-23 – 2018-05-30 (×8): 2 mg via INTRAVENOUS
  Filled 2018-05-23 (×8): qty 1

## 2018-05-23 MED ORDER — OXYCODONE HCL ER 10 MG PO T12A
10.0000 mg | EXTENDED_RELEASE_TABLET | Freq: Two times a day (BID) | ORAL | Status: DC
  Administered 2018-05-23 – 2018-05-31 (×17): 10 mg via ORAL
  Filled 2018-05-23 (×17): qty 1

## 2018-05-23 MED ORDER — ONDANSETRON HCL 4 MG/2ML IJ SOLN
4.0000 mg | Freq: Four times a day (QID) | INTRAMUSCULAR | Status: DC | PRN
  Administered 2018-05-26 – 2018-05-29 (×2): 4 mg via INTRAVENOUS
  Filled 2018-05-23: qty 2

## 2018-05-23 MED ORDER — OXYCODONE HCL 5 MG PO TABS
5.0000 mg | ORAL_TABLET | ORAL | Status: DC | PRN
  Administered 2018-05-23: 5 mg via ORAL

## 2018-05-23 MED ORDER — LISINOPRIL 10 MG PO TABS
10.0000 mg | ORAL_TABLET | Freq: Every day | ORAL | Status: DC
  Administered 2018-05-23 – 2018-05-24 (×2): 10 mg via ORAL
  Filled 2018-05-23 (×2): qty 1

## 2018-05-23 MED ORDER — DULOXETINE HCL 30 MG PO CPEP
60.0000 mg | ORAL_CAPSULE | Freq: Every day | ORAL | Status: DC
  Administered 2018-05-23 – 2018-05-31 (×9): 60 mg via ORAL
  Filled 2018-05-23 (×9): qty 2

## 2018-05-23 MED ORDER — ONDANSETRON HCL 4 MG PO TABS: 4.0000 mg | ORAL_TABLET | Freq: Four times a day (QID) | ORAL | Status: DC | PRN

## 2018-05-23 MED ORDER — PANTOPRAZOLE SODIUM 40 MG PO TBEC
40.0000 mg | DELAYED_RELEASE_TABLET | Freq: Every day | ORAL | Status: DC
  Administered 2018-05-23 – 2018-05-31 (×9): 40 mg via ORAL
  Filled 2018-05-23 (×9): qty 1

## 2018-05-23 MED ORDER — MORPHINE SULFATE (PF) 4 MG/ML IV SOLN
4.0000 mg | Freq: Once | INTRAVENOUS | Status: DC
  Filled 2018-05-23: qty 1

## 2018-05-23 MED ORDER — CELECOXIB 200 MG PO CAPS
200.0000 mg | ORAL_CAPSULE | Freq: Every day | ORAL | Status: DC
  Administered 2018-05-23 – 2018-05-24 (×2): 200 mg via ORAL
  Filled 2018-05-23 (×2): qty 1

## 2018-05-23 MED ORDER — TAMSULOSIN HCL 0.4 MG PO CAPS
0.4000 mg | ORAL_CAPSULE | Freq: Every day | ORAL | Status: DC
  Administered 2018-05-23 – 2018-05-31 (×9): 0.4 mg via ORAL
  Filled 2018-05-23 (×9): qty 1

## 2018-05-23 MED ORDER — GABAPENTIN 300 MG PO CAPS
300.0000 mg | ORAL_CAPSULE | Freq: Every day | ORAL | Status: DC
  Administered 2018-05-23 – 2018-05-30 (×8): 300 mg via ORAL
  Filled 2018-05-23 (×8): qty 1

## 2018-05-23 MED ORDER — HYDROMORPHONE HCL 1 MG/ML IJ SOLN
1.0000 mg | Freq: Once | INTRAMUSCULAR | Status: AC
  Administered 2018-05-23: 1 mg via INTRAVENOUS
  Filled 2018-05-23: qty 1

## 2018-05-23 MED ORDER — PNEUMOCOCCAL VAC POLYVALENT 25 MCG/0.5ML IJ INJ
0.5000 mL | INJECTION | INTRAMUSCULAR | Status: AC
  Administered 2018-05-25: 0.5 mL via INTRAMUSCULAR
  Filled 2018-05-23: qty 0.5

## 2018-05-23 MED ORDER — ACETAMINOPHEN 650 MG RE SUPP: 650.0000 mg | Freq: Four times a day (QID) | RECTAL | Status: DC | PRN

## 2018-05-23 MED ORDER — ONDANSETRON HCL 4 MG/2ML IJ SOLN
4.0000 mg | Freq: Once | INTRAMUSCULAR | Status: AC
  Administered 2018-05-23: 4 mg via INTRAVENOUS
  Filled 2018-05-23: qty 2

## 2018-05-23 MED ORDER — SODIUM CHLORIDE 0.9 % IV SOLN
INTRAVENOUS | Status: DC
  Administered 2018-05-23 – 2018-05-24 (×3): via INTRAVENOUS

## 2018-05-23 MED ORDER — OXYCODONE HCL 5 MG PO TABS
5.0000 mg | ORAL_TABLET | Freq: Four times a day (QID) | ORAL | Status: DC | PRN
  Administered 2018-05-24 – 2018-05-31 (×5): 10 mg via ORAL
  Filled 2018-05-23 (×8): qty 2

## 2018-05-23 MED ORDER — CEFTRIAXONE SODIUM 2 G IJ SOLR
2.0000 g | Freq: Once | INTRAMUSCULAR | Status: AC
  Administered 2018-05-23: 2 g via INTRAVENOUS
  Filled 2018-05-23: qty 20

## 2018-05-23 MED ORDER — ACETAMINOPHEN 325 MG PO TABS
650.0000 mg | ORAL_TABLET | Freq: Four times a day (QID) | ORAL | Status: DC | PRN
  Administered 2018-05-23 – 2018-05-30 (×9): 650 mg via ORAL
  Filled 2018-05-23 (×10): qty 2

## 2018-05-23 MED ORDER — VARENICLINE TARTRATE 1 MG PO TABS
1.0000 mg | ORAL_TABLET | Freq: Two times a day (BID) | ORAL | Status: DC
  Administered 2018-05-23 – 2018-05-31 (×14): 1 mg via ORAL
  Filled 2018-05-23 (×17): qty 1

## 2018-05-23 MED ORDER — INSULIN ASPART 100 UNIT/ML ~~LOC~~ SOLN
0.0000 [IU] | Freq: Every day | SUBCUTANEOUS | Status: DC
  Administered 2018-05-28: 2 [IU] via SUBCUTANEOUS
  Filled 2018-05-23: qty 1

## 2018-05-23 MED ORDER — INSULIN ASPART 100 UNIT/ML ~~LOC~~ SOLN
0.0000 [IU] | Freq: Three times a day (TID) | SUBCUTANEOUS | Status: DC
  Administered 2018-05-23: 1 [IU] via SUBCUTANEOUS
  Administered 2018-05-24: 2 [IU] via SUBCUTANEOUS
  Administered 2018-05-27 – 2018-05-30 (×3): 1 [IU] via SUBCUTANEOUS
  Filled 2018-05-23 (×5): qty 1

## 2018-05-23 NOTE — ED Notes (Signed)
Pt transported to room 205

## 2018-05-23 NOTE — ED Triage Notes (Addendum)
Pt was sent by by gastroenterologist with concerns over upper abdominal/lower chest burning. Pt reports the pain started 2 months prior and went to have CT scan yesterday. Pt was instructed to come in to the hospital for further work up and admission for possible pancreatitis. Pt a & o x 4 in traige in. NAD

## 2018-05-23 NOTE — ED Provider Notes (Signed)
[facility] Emergency Department Provider Note  ____________________________________________  Time seen: Approximately 2:24 PM  I have reviewed the triage vital signs and the nursing notes.   HISTORY  Chief Complaint Chest Pain and Abdominal Pain    HPI Shawn Cardenas is a 63 y.o. male with a history of diabetes hypertension and hemophilia who complains of epigastric pain for the past 2 months, worsening in the last few days.  He saw gastroenterologist who had a CT scan of the chest abdomen pelvis performed yesterday which showed cirrhosis ascites and bilateral pleural effusions on review of electronic medical record.  Pain is nonradiating, severe and sharp.  No aggravating or alleviating factors.  Patient reports a history of hepatitis C that was treated with interferon many years ago.  No recent IV drug use.   No recent trauma.  No exertional or pleuritic symptoms, but patient notes that when he lays down at night in his usual sleep position he feels short of breath where he did before.  He is more comfortable sitting upright.   Past Medical History:  Diagnosis Date  . Anxiety   . Arthritis   . Blood transfusion without reported diagnosis   . Clotting disorder ([place])   . Depression   . Diabetes mellitus without complication ([place])   . GERD (gastroesophageal reflux disease)   . Hypertension   . Neuromuscular disorder [facility])      Patient Active Problem List   Diagnosis Date Noted  . Coronary artery calcification seen on CT scan 11/16/2017  . Family history of MI (myocardial infarction) 10/19/2015  . Obesity 10/19/2015  . Controlled type 2 diabetes mellitus with diabetic arthropathy, without long-term current use of insulin ([place]) 10/19/2015  . Smoker 10/19/2015  . Hyperlipidemia 10/19/2015  . 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 CHOLECYSTECTOMY    . LEG AMPUTATION Right      Prior to Admission  medications   Medication Sig Start Date End Date Taking? Authorizing Provider  Antihemophilic Factor, Recomb, 4098 UNITS KIT Inject into the vein daily. Halixate or Kogenate 3 times weekly or every 12-24 hours as needed    [provider]  celecoxib (CELEBREX) 200 MG capsule Take 200 mg by mouth daily.     [provider]  cephALEXin (KEFLEX) 500 MG capsule Take 1 capsule by mouth 2 (two) times daily. 03/09/17   [provider]  CHANTIX 1 MG tablet Take 1 tablet by mouth 2 (two) times daily. 03/15/17   [provider]  DULoxetine (CYMBALTA) 60 MG capsule Take 60 mg by mouth daily.     [provider]  esomeprazole (NEXIUM) 40 MG capsule Take 40 mg by mouth daily.     [provider]  gabapentin (NEURONTIN) 300 MG capsule Take 300 mg by mouth at bedtime.     [provider]  glipiZIDE (GLUCOTROL XL) 10 MG 24 hr tablet Take 1 tablet by mouth daily. 03/09/17   [provider]  lisinopril (PRINIVIL,ZESTRIL) 10 MG tablet Take 1 tablet by mouth daily. 03/09/17   [provider]  NARCAN 4 MG/0.1ML LIQD nasal spray kit Place 1 spray into both nostrils every 5 (five) minutes as needed. 03/14/17   [provider]  oxyCODONE (OXYCONTIN) 10 mg 12 hr tablet Take 10 mg by mouth every 12 (twelve) hours.    [provider]  pravastatin (PRAVACHOL) 40 MG tablet Take 1 tablet (40 mg total) by mouth  daily. 02/27/18   Minna Merritts, MD  saxagliptin HCl (ONGLYZA) 5 MG TABS tablet Take 5 mg by mouth daily.     [provider]  tamsulosin (FLOMAX) 0.4 MG CAPS capsule Take 0.4 mg by mouth daily.     [provider]     Allergies Aspirin and Other   Family History  Problem Relation Age of Onset  . Heart Problems Mother   . Heart attack Father   . Heart attack Brother   . Heart attack Cousin     Social History Social History   Tobacco Use  . Smoking status: Current Every Day Smoker     Packs/day: 2.00    Years: 20.00    Pack years: 40.00    Types: Cigarettes    Last attempt to quit: 04/24/1978    Years since quitting: 40.1  . Smokeless tobacco: Never Used  Substance Use Topics  . Alcohol use: No  . Drug use: No    Review of Systems  Constitutional:   No fever or chills.  ENT:   No sore throat. No rhinorrhea. Cardiovascular:   No chest pain or syncope. Respiratory:   No dyspnea or cough. Gastrointestinal:   Positive as above for abdominal pain without vomiting diarrhea or constipation. Musculoskeletal:   Negative for focal pain or swelling All other systems reviewed and are negative except as documented above in ROS and HPI.  ____________________________________________   PHYSICAL EXAM:  VITAL SIGNS: ED Triage Vitals  Enc Vitals Group     BP 05/23/18 1129 117/64     Pulse Rate 05/23/18 1129 100     Resp --      Temp 05/23/18 1129 98.6 F (37 C)     Temp Source 05/23/18 1129 Oral     SpO2 05/23/18 1129 100 %     Weight 05/23/18 1129 230 lb (104.3 kg)     Height 05/23/18 1129 _0  (1.727 m)     Head Circumference --      Peak Flow --      Pain Score 05/23/18 1139 7     Pain Loc --      Pain Edu? --      Excl. in [place]? --     Vital signs reviewed, nursing assessments reviewed.   Constitutional:   Alert and oriented. Non-toxic appearance. Eyes:   Conjunctivae are normal. EOMI. PERRL. ENT      Head:   Normocephalic and atraumatic.      Nose:   No congestion/rhinnorhea.       Mouth/Throat:   MMM, no pharyngeal erythema. No peritonsillar mass.       Neck:   No meningismus. Full ROM. Hematological/Lymphatic/Immunilogical:   No cervical lymphadenopathy. Cardiovascular:   RRR. Symmetric bilateral radial and DP pulses.  No murmurs. Cap refill less than 2 seconds. Respiratory:   Normal respiratory effort without tachypnea/retractions. Breath sounds are clear and equal bilaterally. No wheezes/rales/rhonchi. Gastrointestinal:   Soft with pronounced  tenderness in the epigastrium.  Moderately distended. There is no CVA tenderness.  No rebound, rigidity, or guarding.  Not tympanitic Musculoskeletal:   Has post right AKA.  Normal range of motion in all extremities. No joint effusions.  No lower extremity tenderness.  No edema. Neurologic:   Normal speech and language.  Motor grossly intact. No acute focal neurologic deficits are appreciated.  Skin:    Skin is warm, dry and intact. No rash noted.  No petechiae, purpura, or bullae.  ____________________________________________  LABS (pertinent positives/negatives) (all labs ordered are listed, but only abnormal results are displayed) Labs Reviewed  LIPASE, BLOOD - Abnormal; Notable for the following components:      Result Value   Lipase 664 (*)    All other components within normal limits  COMPREHENSIVE METABOLIC PANEL - Abnormal; Notable for the following components:   Glucose, Bld 116 (*)    BUN 29 (*)    Creatinine, Ser 1.51 (*)    Calcium 8.7 (*)    AST 94 (*)    ALT 122 (*)    Alkaline Phosphatase 242 (*)    Total Bilirubin 1.9 (*)    GFR calc non Af Amer 48 (*)    GFR calc Af Amer 56 (*)    All other components within normal limits  CBC - Abnormal; Notable for the following components:   Hemoglobin 12.1 (*)    HCT 38.9 (*)    MCH 25.3 (*)    RDW 18.1 (*)    All other components within normal limits  TROPONIN I - Abnormal; Notable for the following components:   Troponin I 0.05 (*)    All other components within normal limits  URINALYSIS, COMPLETE (UACMP) WITH MICROSCOPIC  LIPID PANEL   ____________________________________________   EKG  Interpreted by me Normal sinus rhythm rate of 100, right axis, normal normals.  Right bundle branch block.  No acute ischemic changes.  ____________________________________________    RADIOLOGY  No results  found.  ____________________________________________   PROCEDURES Procedures  ____________________________________________    CLINICAL IMPRESSION / ASSESSMENT AND PLAN / ED COURSE  Pertinent labs & imaging results that were available during my care of the patient were reviewed by me and considered in my medical decision making (see chart for details).    Patient presents with abdominal pain and swelling, tenderness on exam.  CT from yesterday shows cirrhosis and ascites with some pleural effusions.  Labs today show elevated LFTs and significantly elevated lipase consistent with pancreatitis.  Discussed with hospitalist for further management of new onset cirrhosis and pancreatitis.  Lipid panel added.      ____________________________________________   FINAL CLINICAL IMPRESSION(S) / ED DIAGNOSES    Final diagnoses:  Cirrhosis of liver with ascites, unspecified hepatic cirrhosis type ([place])  Acute pancreatitis, unspecified complication status, unspecified pancreatitis type     ED Discharge Orders    None      Portions of this note were generated with dragon dictation software. Dictation errors may occur despite best attempts at proofreading.   Carrie Mew, MD 05/23/18 1450

## 2018-05-23 NOTE — Progress Notes (Addendum)
Verified tylenol order since patient has elevated liver function. Per Dr. Tressia Miners okay to given.  Wilnette Kales

## 2018-05-23 NOTE — H&P (Signed)
[facility] NAME: Shawn Cardenas    MR#:  [id number]  DATE OF BIRTH:  Mar 20, 1956  DATE OF ADMISSION:  05/23/2018  PRIMARY CARE PHYSICIAN: [name], MD   REQUESTING/REFERRING PHYSICIAN: Dr. Carrie Mew  CHIEF COMPLAINT:   Chief Complaint  Patient presents with  . Chest Pain  . Abdominal Pain    HISTORY OF PRESENT ILLNESS:  Shawn Cardenas  is a 63 y.o. 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 Patient at baseline is wheelchair-bound due to his AKA status on the right leg and also multiple orthopedic surgeries on the left one.  He lives at home with his wife.  Over the last 2 months he says he has been having epigastric pain radiating to the upper back and occasional lower abdominal pain that relieves with defecation.  Went to see GI yesterday and had some blood work and CT abdomen ordered.  The results showed acute pancreatitis with elevated lipase and mild ascites and so he was advised to come to the emergency room. Patient denies any alcohol use.  He was diagnosed with hepatitis C several years ago due to transmission through the factor complex transfusion for his hemophilia in the past.  However he was treated with 2 drugs and was told that the viral load was nondetectable at the time.  He had gallbladder surgery.  He is currently on Onglyza which is a DPP 4 inhibitor for his diabetes which can cause pancreatitis.  CT of the abdomen otherwise did not show any acute findings other than fatty liver.  Does have mild ascites and some pleural effusions.  In the ED patient was noted to have elevated lipase in the 600s, elevated LFTs, mild renal insufficiency.  He is being admitted for acute pancreatitis.  PAST MEDICAL HISTORY:   Past Medical History:   Diagnosis Date  . Anxiety   . Arthritis   . Blood transfusion without reported diagnosis   . Clotting disorder ([place])   . Depression   . Diabetes mellitus without complication ([place])   . GERD (gastroesophageal reflux disease)   . Hypertension   . Neuromuscular disorder ([place])     PAST SURGICAL HISTORY:   Past Surgical History:  Procedure Laterality Date  . CHOLECYSTECTOMY    . LEG AMPUTATION Right     SOCIAL HISTORY:   Social History   Tobacco Use  . Smoking status: Current Every Day Smoker    Packs/day: 2.00    Years: 20.00    Pack years: 40.00    Types: Cigarettes    Last attempt to quit: 04/24/1978    Years since quitting: 40.1  . Smokeless tobacco: Never Used  Substance Use Topics  . Alcohol use: No    FAMILY HISTORY:   Family History  Problem Relation Age of Onset  . Heart Problems Mother   . Heart attack Father   . Heart attack Brother   . Heart attack Cousin     DRUG ALLERGIES:   Allergies  Allergen Reactions  . Aspirin Other (See Comments)    bleeding bleeding ASA contraindicated with bleeding disorder  . Other Other (See Comments)    NSAIDS contraindicated with bleeding disorder.    REVIEW OF SYSTEMS:   Review of Systems  Constitutional: Negative for chills,  fever, malaise/fatigue and weight loss.  HENT: Positive for hearing loss. Negative for ear discharge, ear pain, nosebleeds and tinnitus.        Right ear  Eyes: Negative for blurred vision, double vision and photophobia.  Respiratory: Positive for shortness of breath. Negative for cough, hemoptysis and wheezing.   Cardiovascular: Negative for chest pain, palpitations, orthopnea and leg swelling.  Gastrointestinal: Positive for abdominal pain and nausea. Negative for constipation, diarrhea, heartburn, melena and vomiting.  Genitourinary: Negative for dysuria, frequency, hematuria and urgency.  Musculoskeletal: Positive for back pain, joint pain and myalgias. Negative for neck pain.    Skin: Negative for rash.  Neurological: Negative for dizziness, tingling, tremors, sensory change, speech change, focal weakness and headaches.  Endo/Heme/Allergies: Does not bruise/bleed easily.  Psychiatric/Behavioral: Negative for depression.    MEDICATIONS AT HOME:   Prior to Admission medications   Medication Sig Start Date End Date Taking? Authorizing Provider  Antihemophilic Factor, [place], 5427 UNITS KIT Inject into the vein daily. Halixate or Kogenate 3 times weekly or every 12-24 hours as needed    [provider]  celecoxib (CELEBREX) 200 MG capsule Take 200 mg by mouth daily.     [provider]  cephALEXin (KEFLEX) 500 MG capsule Take 1 capsule by mouth 2 (two) times daily. 03/09/17   [provider]  CHANTIX 1 MG tablet Take 1 tablet by mouth 2 (two) times daily. 03/15/17   [provider]  DULoxetine (CYMBALTA) 60 MG capsule Take 60 mg by mouth daily.     [provider]  esomeprazole (NEXIUM) 40 MG capsule Take 40 mg by mouth daily.     [provider]  gabapentin (NEURONTIN) 300 MG capsule Take 300 mg by mouth at bedtime.     [provider]  glipiZIDE (GLUCOTROL XL) 10 MG 24 hr tablet Take 1 tablet by mouth daily. 03/09/17   [provider]  lisinopril (PRINIVIL,ZESTRIL) 10 MG tablet Take 1 tablet by mouth daily. 03/09/17   [provider]  NARCAN 4 MG/0.1ML LIQD nasal spray kit Place 1 spray into both nostrils every 5 (five) minutes as needed. 03/14/17   [provider]  oxyCODONE (OXYCONTIN) 10 mg 12 hr tablet Take 10 mg by mouth every 12 (twelve) hours.    [provider]  pravastatin (PRAVACHOL) 40 MG tablet Take 1 tablet (40 mg total) by mouth daily. 02/27/18   Minna Merritts, MD  saxagliptin HCl (ONGLYZA) 5 MG TABS tablet Take 5 mg by mouth daily.     [provider]  tamsulosin (FLOMAX) 0.4 MG CAPS capsule Take 0.4 mg by mouth daily.     [provider]      VITAL SIGNS:  Blood pressure 120/74, pulse (!) 101, temperature 98.6 F (37 C), temperature source Oral, resp. rate 16, height '5\' 8"'$  (1.727 m), weight 104.3 kg, SpO2 95 %.  PHYSICAL EXAMINATION:   Physical Exam  GENERAL:  63 y.o.-year-old patient lying in the bed with no acute distress.  EYES: Pupils equal, round, reactive to light and accommodation. No scleral icterus. Extraocular muscles intact.  HEENT: Head atraumatic, normocephalic. Oropharynx and nasopharynx clear.  NECK:  Supple, no jugular venous distention. No thyroid enlargement, no tenderness.  LUNGS: Normal breath sounds bilaterally, no wheezing, rales,rhonchi or crepitation. No use of accessory muscles of respiration. Decreased bibasilar breath sounds CARDIOVASCULAR: S1, S2 normal. No murmurs, rubs, or gallops.  ABDOMEN: Soft, tender in the epigastric region, nondistended. Bowel sounds present. No organomegaly or  mass.  EXTREMITIES: s/p right AKA. No pedal edema, cyanosis, or clubbing.  NEUROLOGIC: Cranial nerves II through XII are intact. Muscle strength 5/5 in all extremities. Sensation intact. Gait not checked.  PSYCHIATRIC: The patient is alert and oriented x 3.  SKIN: No obvious rash, lesion, or ulcer. Bruising noted on the arms  LABORATORY PANEL:   CBC Recent Labs  Lab 05/23/18 1141  WBC 9.4  HGB 12.1*  HCT 38.9*  PLT 176   ------------------------------------------------------------------------------------------------------------------  Chemistries  Recent Labs  Lab 05/23/18 1141  NA 136  K 4.0  CL 103  CO2 24  GLUCOSE 116*  BUN 29*  CREATININE 1.51*  CALCIUM 8.7*  AST 94*  ALT 122*  ALKPHOS 242*  BILITOT 1.9*   ------------------------------------------------------------------------------------------------------------------  Cardiac Enzymes Recent Labs  Lab 05/23/18 1141  TROPONINI 0.05*    ------------------------------------------------------------------------------------------------------------------  RADIOLOGY:  Ct Abdomen Pelvis W Contrast  Result Date: 05/22/2018 CLINICAL DATA:  Epigastric pain for 2 months EXAM: CT ABDOMEN AND PELVIS WITH CONTRAST TECHNIQUE: Multidetector CT imaging of the abdomen and pelvis was performed using the standard protocol following bolus administration of intravenous contrast. CONTRAST:  186m OMNIPAQUE IOHEXOL 300 MG/ML  SOLN COMPARISON:  None. FINDINGS: Lower chest: Lung bases demonstrate bilateral pleural effusions right greater than left without focal infiltrate or sizable parenchymal nodule. Hepatobiliary: Liver is somewhat nodular in appearance consistent with underlying cirrhotic change. No focal mass lesion is seen. The gallbladder has been surgically removed. Very mild perihepatic fluid is noted. Pancreas: Pancreas is within normal limits. Spleen: Spleen is unremarkable with some mild perisplenic fluid. Adrenals/Urinary Tract: Adrenal glands are unremarkable. Kidneys are well visualized bilaterally. No renal calculi or obstructive changes are noted. The bladder is decompressed. Stomach/Bowel: Mild diverticular change of the colon is noted without evidence of diverticulitis. No obstructive changes are seen. The appendix is within normal limits. No small bowel abnormality or gastric abnormality is seen. Vascular/Lymphatic: Mild small mesenteric and retroperitoneal lymph nodes are seen likely reactive in nature. No sizable adenopathy is noted. Reproductive: Prostate is unremarkable. Other: Mild ascites is noted within the abdomen abdominal is surrounding the liver and spleen. Musculoskeletal: Mild degenerative changes of the lumbar spine are noted. IMPRESSION: Changes of cirrhosis with mild ascites and bilateral pleural effusions. Normal-appearing appendix. Mild lymph nodes within the mesentery and retroperitoneum likely reactive in nature. No sizable  adenopathy is noted. These results will be called to the ordering clinician or representative by the Radiologist Assistant, and communication documented in the PACS or zVision Dashboard. Electronically Signed   By: MInez CatalinaM.D.   On: 05/22/2018 13:05    EKG:   Orders placed or performed during the hospital encounter of 05/23/18  . EKG 12-Lead  . EKG 12-Lead  . ED EKG  . ED EKG    IMPRESSION AND PLAN:   GKhriz Liddy is a 63y.o. 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  1.  Acute pancreatitis-likely [place]. -Patient is not an alcoholic and is status post cholecystectomy. -Check triglyceride level. -Gentle hydration, keep n.p.o. for now and monitor.  CT of the abdomen without any significant edema of the pancreas.  2.  Elevated LFTs-likely underlying liver cirrhosis as noted on CT. -Prior history of hepatitis C.  Check viral load for now.  Hold statin.  3.  Mild renal insufficiency-patient receiving IV fluids with his pancreatitis.  Monitor closely  4.  COPD with chronic bronchitis-no acute exacerbation at this time. -Quit smoking few months ago.  Continue to monitor. -Echo as well given chest x-ray findings and also mild pleural effusion.  5.  Arthritis and chronic pain-continue home medications  6.  Hemophilia-which patient takes the factor concentrate on an as needed basis -Continue current treatment.  No active bleeding at this time  7.  DVT prophylaxis-teds and SCDs only.  Patient is status post right AKA.  He has hemophilia and is high risk for  Bleeding   Wheelchair-bound at baseline   All the records are reviewed and case discussed with ED provider. Management plans discussed with the patient, family and they are in  agreement.  CODE STATUS: Full Code  TOTAL TIME TAKING CARE OF THIS PATIENT: 52 minutes.    Gladstone Lighter M.D on 05/23/2018 at 3:31 PM  Between 7am to 6pm - Pager - [phone]  After 6pm go to [email or web address] - password [email or web address] [facility]  Office  [phone]  CC: Primary care physician; [name], MD

## 2018-05-23 NOTE — ED Notes (Signed)
Pt reports pain continues after medication administration. Requesting more medication. MD made aware.

## 2018-05-23 NOTE — ED Notes (Signed)
Admitting MD at bedside.

## 2018-05-23 NOTE — ED Notes (Signed)
PT has blood noted to right ear. Bleeding has stopped and blood is dried. Pt in NAD but reporting he is deaf in the right ear but that is chronic. Pt was cleaning ears last night and reports he must have pushed too hard. No pain and pt denies knowing he was bleeding until staff made mention of it.

## 2018-05-23 NOTE — ED Notes (Signed)
ED TO INPATIENT HANDOFF REPORT  Name/Age/Gender Shawn Cardenas 63 y.o. male  Code Status   Home/SNF/Other Home  Chief Complaint sent by dr chest pain  Level of Care/Admitting Diagnosis ED Disposition    ED Disposition Condition [place]: [facility] [[id number]]  Level of Care: Med-Surg [16]  Diagnosis: Acute pancreatitis [577.0.ICD-9-CM]  Admitting Physician: Gladstone Lighter [[id number]]  Attending Physician: Gladstone Lighter [[id number]]  Estimated length of stay: past midnight tomorrow  Certification:: I certify this patient will need inpatient services for at least 2 midnights  PT Class (Do Not Modify): Inpatient [101]  PT Acc Code (Do Not Modify): Private [1]       Medical History Past Medical History:  Diagnosis Date  . Anxiety   . Arthritis   . Blood transfusion without reported diagnosis   . Clotting disorder ([place])   . Depression   . Diabetes mellitus without complication ([place])   . GERD (gastroesophageal reflux disease)   . Hypertension   . Neuromuscular disorder ([facility])     Allergies Allergies  Allergen Reactions  . Aspirin Other (See Comments)    bleeding bleeding ASA contraindicated with bleeding disorder  . Other Other (See Comments)    NSAIDS contraindicated with bleeding disorder.    IV Location/Drains/Wounds Patient Lines/Drains/Airways Status   Active Line/Drains/Airways    Name:   Placement date:   Placement time:   Site:   Days:   Implanted Port 05/23/18 Left Chest   05/23/18    1448    Chest   less than 1          Labs/Imaging Results for orders placed or performed during the hospital encounter of 05/23/18 (from the past 48 hour(s))  Lipase, blood     Status: Abnormal   Collection Time: 05/23/18 11:41 AM  Result Value Ref Range   Lipase 664 (H) 11 - 51 U/L    Comment: RESULT CONFIRMED BY MANUAL DILUTION SMA Performed at [facility], [address] [place], [place] [address]    Comprehensive metabolic panel     Status: Abnormal   Collection Time: 05/23/18 11:41 AM  Result Value Ref Range   Sodium 136 135 - 145 mmol/L   Potassium 4.0 3.5 - 5.1 mmol/L   Chloride 103 98 - 111 mmol/L   CO2 24 22 - 32 mmol/L   Glucose, Bld 116 (H) 70 - 99 mg/dL   BUN 29 (H) 8 - 23 mg/dL   Creatinine, Ser 1.51 (H) 0.61 - 1.24 mg/dL   Calcium 8.7 (L) 8.9 - 10.3 mg/dL   Total Protein 7.0 6.5 - 8.1 g/dL   Albumin 3.5 3.5 - 5.0 g/dL   AST 94 (H) 15 - 41 U/L   ALT 122 (H) 0 - 44 U/L   Alkaline Phosphatase 242 (H) 38 - 126 U/L   Total Bilirubin 1.9 (H) 0.3 - 1.2 mg/dL   GFR calc non Af Amer 48 (L) >60 mL/min   GFR calc Af Amer 56 (L) >60 mL/min   Anion gap 9 5 - 15    Comment: Performed at [facility], [address] [place], [place] [address]  CBC     Status: Abnormal   Collection Time: 05/23/18 11:41 AM  Result Value Ref Range   WBC 9.4 4.0 - 10.5 K/uL   RBC 4.79 4.22 - 5.81 MIL/uL   Hemoglobin 12.1 (L) 13.0 - 17.0 g/dL   HCT 38.9 (L) 39.0 - 52.0 %  MCV 81.2 80.0 - 100.0 fL   MCH 25.3 (L) 26.0 - 34.0 pg   MCHC 31.1 30.0 - 36.0 g/dL   RDW 18.1 (H) 11.5 - 15.5 %   Platelets 176 150 - 400 K/uL   nRBC 0.0 0.0 - 0.2 %    Comment: Performed at [facility], [address] [place], [place] [address]  Troponin I - ONCE - STAT     Status: Abnormal   Collection Time: 05/23/18 11:41 AM  Result Value Ref Range   Troponin I 0.05 (HH) <0.03 ng/mL    Comment: CRITICAL RESULT CALLED TO, READ BACK BY AND VERIFIED WITH [facility] JOHNSON AT 4627 05/23/2018 SMA/HKP Performed at [facility], [address] [place], [place] [address]   Lipid panel     Status: Abnormal   Collection Time: 05/23/18 11:41 AM  Result Value Ref Range   Cholesterol 112 0 - 200 mg/dL   Triglycerides 53 <150 mg/dL   HDL 33 (L) >40 mg/dL   Total CHOL/HDL Ratio 3.4 RATIO   VLDL 11 0 - 40 mg/dL   LDL Cholesterol 68 0 - 99 mg/dL    Comment:        Total Cholesterol/HDL:CHD  Risk Coronary Heart Disease Risk Table                     Men   Women  1/2 Average Risk   3.4   3.3  Average Risk       5.0   4.4  2 X Average Risk   9.6   7.1  3 X Average Risk  23.4   11.0        Use the calculated Patient Ratio above and the CHD Risk Table to determine the patient's CHD Risk.        ATP III CLASSIFICATION (LDL):  <100     mg/dL   Optimal  100-129  mg/dL   Near or Above                    Optimal  130-159  mg/dL   Borderline  160-189  mg/dL   High  >190     mg/dL   Very High Performed at [place], [place] [address]    Ct Abdomen Pelvis W Contrast  Result Date: 05/22/2018 CLINICAL DATA:  Epigastric pain for 2 months EXAM: CT ABDOMEN AND PELVIS WITH CONTRAST TECHNIQUE: Multidetector CT imaging of the abdomen and pelvis was performed using the standard protocol following bolus administration of intravenous contrast. CONTRAST:  157mL OMNIPAQUE IOHEXOL 300 MG/ML  SOLN COMPARISON:  None. FINDINGS: Lower chest: Lung bases demonstrate bilateral pleural effusions right greater than left without focal infiltrate or sizable parenchymal nodule. Hepatobiliary: Liver is somewhat nodular in appearance consistent with underlying cirrhotic change. No focal mass lesion is seen. The gallbladder has been surgically removed. Very mild perihepatic fluid is noted. Pancreas: Pancreas is within normal limits. Spleen: Spleen is unremarkable with some mild perisplenic fluid. Adrenals/Urinary Tract: Adrenal glands are unremarkable. Kidneys are well visualized bilaterally. No renal calculi or obstructive changes are noted. The bladder is decompressed. Stomach/Bowel: Mild diverticular change of the colon is noted without evidence of diverticulitis. No obstructive changes are seen. The appendix is within normal limits. No small bowel abnormality or gastric abnormality is seen. Vascular/Lymphatic: Mild small mesenteric and retroperitoneal lymph nodes are seen likely  reactive in nature. No sizable adenopathy is noted. Reproductive: Prostate is unremarkable.  Other: Mild ascites is noted within the abdomen abdominal is surrounding the liver and spleen. Musculoskeletal: Mild degenerative changes of the lumbar spine are noted. IMPRESSION: Changes of cirrhosis with mild ascites and bilateral pleural effusions. Normal-appearing appendix. Mild lymph nodes within the mesentery and retroperitoneum likely reactive in nature. No sizable adenopathy is noted. These results will be called to the ordering clinician or representative by the Radiologist Assistant, and communication documented in the PACS or zVision Dashboard. Electronically Signed   By: Inez Catalina M.D.   On: 05/22/2018 13:05    Pending Labs Unresulted Labs (From admission, onward)    Start     Ordered   05/23/18 1552  Hemoglobin A1c  Add-on,   AD     05/23/18 1551   05/23/18 1532  HCV RNA quant  Once,   STAT    Question:  Specimen collection method  Answer:  Lab=Lab collect   05/23/18 1531   05/23/18 1531  Hepatitis panel, acute  Once,   STAT    Question:  Specimen collection method  Answer:  Lab=Lab collect   05/23/18 1531   05/23/18 1524  H. pylori antigen, stool  Once,   STAT     05/23/18 1523   05/23/18 1141  Urinalysis, Complete w Microscopic  ONCE - STAT,   STAT     05/23/18 1141   Signed and Held  HIV antibody (Routine Testing)  Once,   R     Signed and Held   Signed and Held  TSH  Once,   R     Signed and Held   Signed and Occupational hygienist morning,   R     Signed and Held   Visual merchandiser and Held  CBC  Tomorrow morning,   R     Signed and Held   Signed and Held  Lipase, blood  Tomorrow morning,   R     Signed and Held   Signed and Held  Lipid panel  Add-on,   R     Signed and Held          Vitals/Pain Today's Vitals   05/23/18 1506 05/23/18 1508 05/23/18 1516 05/23/18 1530  BP: 120/74   115/68  Pulse: (!) 101   93  Resp: 16   16  Temp:      TempSrc:      SpO2:  95%   98%  Weight:      Height:      PainSc:  6  6      Isolation Precautions No active isolations  Medications Medications  oxyCODONE (OXYCONTIN) 12 hr tablet 10 mg (has no administration in time range)  oxyCODONE (Oxy IR/ROXICODONE) immediate release tablet 5 mg (5 mg Oral Given 05/23/18 1516)  cefTRIAXone (ROCEPHIN) 2 g in sodium chloride 0.9 % 100 mL IVPB ( Intravenous Stopped 05/23/18 1528)  ondansetron (ZOFRAN) injection 4 mg (4 mg Intravenous Given 05/23/18 1454)  HYDROmorphone (DILAUDID) injection 1 mg (1 mg Intravenous Given 05/23/18 1454)    Mobility power wheelchair

## 2018-05-23 NOTE — ED Notes (Signed)
Port accessed by [employer] with a 20Gx1 in power port

## 2018-05-24 LAB — URINALYSIS, COMPLETE (UACMP) WITH MICROSCOPIC
Bacteria, UA: NONE SEEN
Bilirubin Urine: NEGATIVE
GLUCOSE, UA: NEGATIVE mg/dL
HGB URINE DIPSTICK: NEGATIVE
KETONES UR: NEGATIVE mg/dL
LEUKOCYTES UA: NEGATIVE
Nitrite: NEGATIVE
PROTEIN: 100 mg/dL — AB
Specific Gravity, Urine: 1.014 (ref 1.005–1.030)
Squamous Epithelial / LPF: NONE SEEN (ref 0–5)
pH: 5 (ref 5.0–8.0)

## 2018-05-24 LAB — CBC
HEMATOCRIT: 36 % — AB (ref 39.0–52.0)
HEMOGLOBIN: 11.1 g/dL — AB (ref 13.0–17.0)
MCH: 25.6 pg — ABNORMAL LOW (ref 26.0–34.0)
MCHC: 30.8 g/dL (ref 30.0–36.0)
MCV: 82.9 fL (ref 80.0–100.0)
Platelets: 142 10*3/uL — ABNORMAL LOW (ref 150–400)
RBC: 4.34 MIL/uL (ref 4.22–5.81)
RDW: 18.1 % — ABNORMAL HIGH (ref 11.5–15.5)
WBC: 8.7 10*3/uL (ref 4.0–10.5)
nRBC: 0 % (ref 0.0–0.2)

## 2018-05-24 LAB — HEPATITIS PANEL, ACUTE
HCV Ab: 2.1 s/co ratio — ABNORMAL HIGH (ref 0.0–0.9)
Hep A IgM: NEGATIVE
Hep B C IgM: NEGATIVE
Hepatitis B Surface Ag: NEGATIVE

## 2018-05-24 LAB — BASIC METABOLIC PANEL
Anion gap: 7 (ref 5–15)
BUN: 26 mg/dL — AB (ref 8–23)
CO2: 24 mmol/L (ref 22–32)
Calcium: 8.3 mg/dL — ABNORMAL LOW (ref 8.9–10.3)
Chloride: 108 mmol/L (ref 98–111)
Creatinine, Ser: 1.58 mg/dL — ABNORMAL HIGH (ref 0.61–1.24)
GFR calc Af Amer: 53 mL/min — ABNORMAL LOW (ref >60)
GFR calc non Af Amer: 46 mL/min — ABNORMAL LOW (ref >60)
Glucose, Bld: 56 mg/dL — ABNORMAL LOW (ref 70–99)
POTASSIUM: 4.1 mmol/L (ref 3.5–5.1)
Sodium: 139 mmol/L (ref 135–145)

## 2018-05-24 LAB — ECHOCARDIOGRAM COMPLETE
HEIGHTINCHES: 68 in
Weight: 3680 oz

## 2018-05-24 LAB — GLUCOSE, CAPILLARY
Glucose-Capillary: 104 mg/dL — ABNORMAL HIGH (ref 70–99)
Glucose-Capillary: 49 mg/dL — ABNORMAL LOW (ref 70–99)
Glucose-Capillary: 191 mg/dL — ABNORMAL HIGH (ref 70–99)
Glucose-Capillary: 107 mg/dL — ABNORMAL HIGH (ref 70–99)
Glucose-Capillary: 132 mg/dL — ABNORMAL HIGH (ref 70–99)

## 2018-05-24 LAB — LIPASE, BLOOD: Lipase: 519 U/L — ABNORMAL HIGH (ref 11–51)

## 2018-05-24 LAB — HIV ANTIBODY (ROUTINE TESTING W REFLEX): HIV Screen 4th Generation wRfx: NONREACTIVE

## 2018-05-24 MED ORDER — DEXTROSE 50 % IV SOLN
INTRAVENOUS | Status: AC
  Administered 2018-05-24: 25 mL
  Filled 2018-05-24: qty 50

## 2018-05-24 NOTE — Plan of Care (Signed)
  Problem: Spiritual Needs Goal: Ability to function at adequate level Outcome: Progressing   Problem: Health Behavior/Discharge Planning: Goal: Ability to manage health-related needs will improve Outcome: Progressing   Problem: Clinical Measurements: Goal: Ability to maintain clinical measurements within normal limits will improve Outcome: Progressing Goal: Will remain free from infection Outcome: Progressing Goal: Diagnostic test results will improve Outcome: Progressing Goal: Respiratory complications will improve Outcome: Progressing Goal: Cardiovascular complication will be avoided Outcome: Progressing   Problem: Nutrition: Goal: Adequate nutrition will be maintained Outcome: Progressing  Patient tolerating clear liquid diet

## 2018-05-24 NOTE — Progress Notes (Signed)
[facility] [place] NAME: Shawn Cardenas    MR#:  [id number]  DATE OF BIRTH:  Nov 28, 1955  SUBJECTIVE: Admitted for acute pancreatitis secondary to Onglyza, lipase number is coming down, denies abdominal pain, nausea and feeling much better today.  However has hypoglycemia, blood sugar dropped to 49 this morning.  CHIEF COMPLAINT:   Chief Complaint  Patient presents with  . Chest Pain  . Abdominal Pain    REVIEW OF SYSTEMS:   ROS CONSTITUTIONAL: No fever, fatigue or weakness.  EYES: No blurred or double vision.  EARS, NOSE, AND THROAT: No tinnitus or ear pain.  RESPIRATORY: No cough, shortness of breath, wheezing or hemoptysis.  CARDIOVASCULAR: No chest pain, orthopnea, edema.  GASTROINTESTINAL: No nausea, vomiting, diarrhea or abdominal pain.  GENITOURINARY: No dysuria, hematuria.  ENDOCRINE: No polyuria, nocturia,  HEMATOLOGY: No anemia, easy bruising or bleeding SKIN: No rash or lesion. MUSCULOSKELETAL: No joint pain or arthritis.   NEUROLOGIC: No tingling, numbness, weakness.  PSYCHIATRY: No anxiety or depression.   DRUG ALLERGIES:   Allergies  Allergen Reactions  . Aspirin Other (See Comments)    bleeding bleeding ASA contraindicated with bleeding disorder  . Other Other (See Comments)    NSAIDS contraindicated with bleeding disorder.    VITALS:  Blood pressure 105/70, pulse 79, temperature (!) 97.3 F (36.3 C), temperature source Oral, resp. rate 18, height 5\' 8"  (1.727 m), weight 104.3 kg, SpO2 97 %.  PHYSICAL EXAMINATION:  GENERAL:  63 y.o.-year-old patient lying in the bed with no acute distress.  EYES: Pupils equal, round, reactive to light and accommodation. No scleral icterus. Extraocular muscles intact.  HEENT: Head atraumatic, normocephalic. Oropharynx and nasopharynx clear.  NECK:  Supple, no jugular venous distention. No thyroid enlargement, no tenderness.  LUNGS: Normal breath sounds bilaterally, no  wheezing, rales,rhonchi or crepitation. No use of accessory muscles of respiration.  CARDIOVASCULAR: S1, S2 normal. No murmurs, rubs, or gallops.  ABDOMEN: Soft, nontender, nondistended. Bowel sounds present. No organomegaly or mass.  EXTREMITIES: No pedal edema, cyanosis, or clubbing.  NEUROLOGIC: Cranial nerves II through XII are intact. Muscle strength 5/5 in all extremities. Sensation intact. Gait not checked.  PSYCHIATRIC: The patient is alert and oriented x 3.  SKIN: No obvious rash, lesion, or ulcer.    LABORATORY PANEL:   CBC Recent Labs  Lab 05/24/18 0430  WBC 8.7  HGB 11.1*  HCT 36.0*  PLT 142*   ------------------------------------------------------------------------------------------------------------------  Chemistries  Recent Labs  Lab 05/23/18 1141 05/24/18 0430  NA 136 139  K 4.0 4.1  CL 103 108  CO2 24 24  GLUCOSE 116* 56*  BUN 29* 26*  CREATININE 1.51* 1.58*  CALCIUM 8.7* 8.3*  AST 94*  --   ALT 122*  --   ALKPHOS 242*  --   BILITOT 1.9*  --    ------------------------------------------------------------------------------------------------------------------  Cardiac Enzymes Recent Labs  Lab 05/23/18 1141  TROPONINI 0.05*   ------------------------------------------------------------------------------------------------------------------  RADIOLOGY:  Ct Abdomen Pelvis W Contrast  Result Date: 05/22/2018 CLINICAL DATA:  Epigastric pain for 2 months EXAM: CT ABDOMEN AND PELVIS WITH CONTRAST TECHNIQUE: Multidetector CT imaging of the abdomen and pelvis was performed using the standard protocol following bolus administration of intravenous contrast. CONTRAST:  162mL OMNIPAQUE IOHEXOL 300 MG/ML  SOLN COMPARISON:  None. FINDINGS: Lower chest: Lung bases demonstrate bilateral pleural effusions right greater than left without focal infiltrate or sizable parenchymal nodule. Hepatobiliary: Liver is somewhat nodular in appearance consistent with underlying  cirrhotic change. No focal mass lesion is seen. The gallbladder has been surgically removed. Very mild perihepatic fluid is noted. Pancreas: Pancreas is within normal limits. Spleen: Spleen is unremarkable with some mild perisplenic fluid. Adrenals/Urinary Tract: Adrenal glands are unremarkable. Kidneys are well visualized bilaterally. No renal calculi or obstructive changes are noted. The bladder is decompressed. Stomach/Bowel: Mild diverticular change of the colon is noted without evidence of diverticulitis. No obstructive changes are seen. The appendix is within normal limits. No small bowel abnormality or gastric abnormality is seen. Vascular/Lymphatic: Mild small mesenteric and retroperitoneal lymph nodes are seen likely reactive in nature. No sizable adenopathy is noted. Reproductive: Prostate is unremarkable. Other: Mild ascites is noted within the abdomen abdominal is surrounding the liver and spleen. Musculoskeletal: Mild degenerative changes of the lumbar spine are noted. IMPRESSION: Changes of cirrhosis with mild ascites and bilateral pleural effusions. Normal-appearing appendix. Mild lymph nodes within the mesentery and retroperitoneum likely reactive in nature. No sizable adenopathy is noted. These results will be called to the ordering clinician or representative by the Radiologist Assistant, and communication documented in the PACS or zVision Dashboard. Electronically Signed   By: Inez Catalina M.D.   On: 05/22/2018 13:05    EKG:   Orders placed or performed during the hospital encounter of 05/23/18  . EKG 12-Lead  . EKG 12-Lead  . ED EKG  . ED EKG    ASSESSMENT AND PLAN:   #1 acute pancreatitis likely secondary to Onglyza, discontinue Onglyza, if patient is taking Januvia also at home discontinue Januvia as Both are DPP 4 inhibitors.  Patient lipase is coming down, start clear liquids today.  Patient CT abdomen is nondiagnostic, had a gallbladder operation, so pancreatitis likely  secondary to medication.  Feeling much better after discontinuation of Onglyza.  2.  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  4.  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  5.  History of liver cirrhosis, hep C.  Hold statins as LFTs is elevated, #5 acute kidney injury secondary to poor p.o. intake due to abdominal pain, acute pancreatitis, check lites tomorrow, hold lisinopril, continue IV hydration, avoid nephrotoxic agents.  Avoid Celebrex also secondary to acute renal failure.  Continue oxycodone for pain control if needed.  6.  History of neuropathy: Continue Cymbalta, Neurontin.     #7 tobacco abuse, patient on Chantix.  Continue that.  All the records are reviewed and case discussed with Care Management/Social Workerr. Management plans discussed with the patient, family and they are in agreement.  CODE STATUS: Full code  TOTAL TIME TAKING CARE OF THIS PATIENT: 40 minutes.   POSSIBLE D/C IN 1-2 DAYS, DEPENDING ON CLINICAL CONDITION.   More than 50% time spent in counseling, coordination of care  [name] M.D on 05/24/2018 at 11:14 AM  Between 7am to 6pm - Pager - [phone]  After 6pm go to [email or web address] - password [email or web address] [facility]  Office  [phone]  CC: Primary care physician; [name], MD   Note: This dictation was prepared with Dragon dictation along with smaller phrase technology. Any transcriptional errors that result from this process are unintentional.

## 2018-05-24 NOTE — Progress Notes (Addendum)
Hypoglycemic Event  CBG: 49  Treatment: 1/2 amp of D 50 Symptoms: none Follow-up CBG: Time: 0811 CBG Result: 104  Possible Reasons for Event:  Comments/MD notified: MD aware    Marissa Lowrey A Kisa Fujii

## 2018-05-24 NOTE — Progress Notes (Addendum)
Inpatient Diabetes Program Recommendations  AACE/ADA: New Consensus Statement on Inpatient Glycemic Control   Target Ranges:  Prepandial:   less than 140 mg/dL      Peak postprandial:   less than 180 mg/dL (1-2 hours)      Critically ill patients:  140 - 180 mg/dL  Results for YARIEL, FERRARIS (MRN [id number]) as of 05/24/2018 10:26  Ref. Range 05/23/2018 16:40 05/23/2018 21:50 05/24/2018 07:46 05/24/2018 08:10  Glucose-Capillary Latest Ref Range: 70 - 99 mg/dL 126 (H) 72 49 (L) 104 (H)   Results for CARLIE, CORPUS (MRN [id number]) as of 05/24/2018 10:26  Ref. Range 05/23/2018 11:41  Hemoglobin A1C Latest Ref Range: 4.8 - 5.6 % 5.7 (H)   Review of Glycemic Control  Diabetes history: DM2 Outpatient Diabetes medications: Glipizide XL 10 mg daily, Onglyza 5 mg daily Current orders for Inpatient glycemic control: Glipizide XL 10 mg daily, Novolog 0-9 units TID with meals, Novolog 0-5 units QHS  Inpatient Diabetes Program Recommendations:   Oral Agents: Fasting glucose 69 mg/dl today. Noted patient has already received Glipizide this morning. Please discontinue Glipizide while inpatient.  HgbA1C: A1C 5.7% on 05/23/18 indicating an average glucose of 117 mg/dl. With A1C being this low; question if patient is having hypoglycemia as a outpatient. Also noted, patient has Glipizide XL 10 mg daily, Onglyza 5 mg daily, and Januvia 100 mg daily listed as prescribed outpatient DM medications on last office visit note by Learta Codding, PA on 03/01/18.  Question if that is correct, since Onglyza and Januvia should not both be prescribed as they are both DPP-4 inhibitors. Likely needs outpatient DM medications adjusted.  Addendum 05/24/18@13 :45-Spoke with patient regarding DM control and outpatient DM medications. Patient reports that is wife takes care of his medications and he is not sure what he takes for DM control but knows he takes 2 or 3 DM medications.  Patient reports that all his medications come from [facility] and he called them directly while in the room and allowed Diabetes Coordinator to speak with Lesleigh Noe Warehouse manager) with [employer]. Pharmacist confirmed that patient receives Onglyza 5 mg daily and Glipizide XL 10 mg daily for DM. Pharmacist verified that patient is NOT on Januvia.  Discussed Onglyza with patient and risk of pancreatitis with the medication.  Also discussed A1C of 5.7% on 05/23/18 which indicates an average glucose of 117 mg/dl. Patient states that he does not recall what his prior A1C was.  Explained that MD may want to discontinue Onglyza at discharge due to patient being admitted with acute pancreatitis. Explained that if DM medications are adjusted, it will be on the discharge paperwork. Patient verbalized understanding of information discussed and states that he has no questions at this time related to DM.  Informed Dr. Vianne Bulls that patient was only taking Onglyza and Glipizide as an outpatient. Since A1C is 5.7%, patient's glucose may be controlled with just the Glipizide as an outpatient.  Recommend that Onglyza be discontinued as an outpatient and Glipizide XL 10 mg daily be continued and have patient follow up with PCP regarding DM control.   Thanks, Barnie Alderman, RN, MSN, CDE Diabetes Coordinator Inpatient Diabetes Program [phone] (Team Pager from 8am to 5pm)

## 2018-05-24 NOTE — Progress Notes (Signed)
   05/24/18 1300  Clinical Encounter Type  Visited With Patient  Visit Type Initial  Referral From Nurse  Consult/Referral To Chaplain  Chaplain received an OR for AD. Chaplain visited patient and patient wanted to be educated on what AD was. Patient will discuss with wife and contact a Chaplain if he wishes to complete.

## 2018-05-24 NOTE — Progress Notes (Signed)
Patient tells staff that he plans to order a pizza tonight and will not eat any more clear liquids. I explained that there are risks of being no compliant with his diet and that it could cause worsening of his diagnosed pancreatitis. Patient not concerned about the potential consequences and tells me eating what he wants is the only way to know if he feels better. MD made aware, orders placed for low sodium, carb mod diet. Will provide menu to patient and attempt to find a better diet option.

## 2018-05-24 NOTE — Progress Notes (Signed)
Patient has history of Hemophiliac disease type A. Per patient he does treatments via injections and intervenously in his port. Upon entry to his room staff noted patient had empty syringe at bedside that was not given by staff. Per patient he "knew we didn't have what he needed" so he gave himself medication that he normally takes. Staff informed him there could be significant issues with giving himself medications without the Doctor being aware and we have asked him not to do so without proper orders. Per patient he does not have any more of the medication at bedside. MD aware. No interventions at this time. Will continue to monitor.

## 2018-05-25 ENCOUNTER — Inpatient Hospital Stay: Payer: Medicare Other

## 2018-05-25 LAB — HCV RNA QUANT: HCV QUANT: NOT DETECTED [IU]/mL (ref >50)

## 2018-05-25 LAB — BASIC METABOLIC PANEL
Anion gap: 7 (ref 5–15)
BUN: 27 mg/dL — ABNORMAL HIGH (ref 8–23)
CHLORIDE: 105 mmol/L (ref 98–111)
CO2: 23 mmol/L (ref 22–32)
Calcium: 8.4 mg/dL — ABNORMAL LOW (ref 8.9–10.3)
Creatinine, Ser: 1.54 mg/dL — ABNORMAL HIGH (ref 0.61–1.24)
GFR calc Af Amer: 55 mL/min — ABNORMAL LOW (ref >60)
GFR calc non Af Amer: 47 mL/min — ABNORMAL LOW (ref >60)
GLUCOSE: 102 mg/dL — AB (ref 70–99)
Potassium: 4.4 mmol/L (ref 3.5–5.1)
Sodium: 135 mmol/L (ref 135–145)

## 2018-05-25 LAB — GLUCOSE, CAPILLARY
Glucose-Capillary: 74 mg/dL (ref 70–99)
Glucose-Capillary: 113 mg/dL — ABNORMAL HIGH (ref 70–99)
Glucose-Capillary: 111 mg/dL — ABNORMAL HIGH (ref 70–99)
Glucose-Capillary: 146 mg/dL — ABNORMAL HIGH (ref 70–99)

## 2018-05-25 LAB — LIPASE, BLOOD: Lipase: 640 U/L — ABNORMAL HIGH (ref 11–51)

## 2018-05-25 MED ORDER — FUROSEMIDE 10 MG/ML IJ SOLN
20.0000 mg | INTRAMUSCULAR | Status: AC
  Administered 2018-05-25: 20 mg via INTRAVENOUS
  Filled 2018-05-25: qty 4

## 2018-05-25 MED ORDER — GABAPENTIN 300 MG PO CAPS
300.0000 mg | ORAL_CAPSULE | Freq: Three times a day (TID) | ORAL | Status: DC
  Administered 2018-05-25 – 2018-05-31 (×18): 300 mg via ORAL
  Filled 2018-05-25 (×19): qty 1

## 2018-05-25 MED ORDER — FUROSEMIDE 20 MG PO TABS
20.0000 mg | ORAL_TABLET | Freq: Two times a day (BID) | ORAL | Status: DC
  Administered 2018-05-25 – 2018-05-31 (×11): 20 mg via ORAL
  Filled 2018-05-25 (×11): qty 1

## 2018-05-25 MED ORDER — SODIUM CHLORIDE 0.9% FLUSH
3.0000 mL | Freq: Two times a day (BID) | INTRAVENOUS | Status: DC
  Administered 2018-05-25 – 2018-05-30 (×10): 3 mL via INTRAVENOUS

## 2018-05-25 NOTE — Progress Notes (Signed)
MD made aware that pt has a can of chewing tobacco on his bedside table, no new orders given at this time. RN told pt that this facility is a tobacco free facility. Pt does not seem interested in disposing of his chewing tobacco. RN will reinstate the removal of chewing tobacco when pt.'s family is at bedside.   Brynnlee Cumpian [employer]

## 2018-05-25 NOTE — Progress Notes (Addendum)
Pt called RN to his room that he had slid out of his personal scooter at [phone].When RN arrived at [phone] pt was found to be on the floor by his personal scooter. Pt stated that " I was reaching for my hat that fell on the floor and I do this a hundred times at home but I lost my grip when I was reaching for my hat and that's how I fell in the floor." pt has been scored a moderate fall risk since admission and has been transferring and maneuvering within his room independently prior to this incident. 2 RNs and 1 NT helped pt back into his personal scooter. RN went over the hospital protocol since this is a considered a fall. RN stated that I need to place a yellow fall risk bracelet on, yellow gripper socks, bed alarm needs to be on, nursing staff needs to assist him with transfers to the bed and bathroom, and chair alarm needs to be on. Pt agrees to all fall risk protocol but the chair alarm, he does not want the chair alarm on. VSS, pt A/O X4, pt is just experiencing chronic leg/back pain, no injury noted. MD notified, no new orders given at this time. Pt did not want RN to call family. Pt is aware to call nursing staff when pt needs to go to the bathroom. Bed alarm/fall risk band/ yellow socks were applied. RN will continue to monitor pt closely.   Iktan Aikman [employer]

## 2018-05-25 NOTE — Progress Notes (Signed)
[facility] [place] NAME: Shawn Cardenas    MR#:  [id number]  DATE OF BIRTH:  1956/02/04  SUBJECTIVE: Admitted for acute pancreatitis secondary to [place], stopped Onglyza, patient lipase slightly up because as he wanted to eat regular food.  No abdominal pain, nausea, vomiting.  Short of breath last night, stopped IV fluids, received Lasix.  Chest x-ray showed bronchitis, increased interstitial markings.  Echocardiogram is done this morning showed normal EF.  Received a dose of Lasix last night and he feels much better..  CHIEF COMPLAINT:   Chief Complaint  Patient presents with  . Chest Pain  . Abdominal Pain    REVIEW OF SYSTEMS:   ROS CONSTITUTIONAL: No fever, fatigue or weakness.  EYES: No blurred or double vision.  EARS, NOSE, AND THROAT: No tinnitus or ear pain.  RESPIRATORY: No cough, shortness of breath, wheezing or hemoptysis.  CARDIOVASCULAR: No chest pain, orthopnea, edema.  GASTROINTESTINAL: No nausea, vomiting, diarrhea or abdominal pain.  GENITOURINARY: No dysuria, hematuria.  ENDOCRINE: No polyuria, nocturia,  HEMATOLOGY: No anemia, easy bruising or bleeding SKIN: No rash or lesion. MUSCULOSKELETAL: No joint pain or arthritis. Right bka  NEUROLOGIC: No tingling, numbness, weakness.  PSYCHIATRY: No anxiety or depression.   DRUG ALLERGIES:   Allergies  Allergen Reactions  . Aspirin Other (See Comments)    bleeding bleeding ASA contraindicated with bleeding disorder  . Other Other (See Comments)    NSAIDS contraindicated with bleeding disorder.    VITALS:  Blood pressure 110/78, pulse (!) 101, temperature 98.6 F (37 C), temperature source Oral, resp. rate 17, height 5\' 8"  (1.727 m), weight 104.3 kg, SpO2 100 %.  PHYSICAL EXAMINATION:  GENERAL:  63 y.o.-year-old patient lying in the bed with no acute distress.  EYES: Pupils equal, round, reactive to light and accommodation. No scleral icterus. Extraocular  muscles intact.  HEENT: Head atraumatic, normocephalic. Oropharynx and nasopharynx clear.  NECK:  Supple, no jugular venous distention. No thyroid enlargement, no tenderness.  LUNGS: Normal breath sounds bilaterally, no wheezing, rales,rhonchi or crepitation. No use of accessory muscles of respiration.  CARDIOVASCULAR: S1, S2 normal. No murmurs, rubs, or gallops.  ABDOMEN: Soft, nontender, nondistended. Bowel sounds present. No organomegaly or mass.  EXTREMITIES: No pedal edema, cyanosis, or clubbing. Right BKA NEUROLOGIC: Cranial nerves II through XII are intact. Muscle strength 5/5 in all extremities. Sensation intact. Gait not checked.  PSYCHIATRIC: The patient is alert and oriented x 3.  SKIN: No obvious rash, lesion, or ulcer.    LABORATORY PANEL:   CBC Recent Labs  Lab 05/24/18 0430  WBC 8.7  HGB 11.1*  HCT 36.0*  PLT 142*   ------------------------------------------------------------------------------------------------------------------  Chemistries  Recent Labs  Lab 05/23/18 1141  05/25/18 0414  NA 136   < > 135  K 4.0   < > 4.4  CL 103   < > 105  CO2 24   < > 23  GLUCOSE 116*   < > 102*  BUN 29*   < > 27*  CREATININE 1.51*   < > 1.54*  CALCIUM 8.7*   < > 8.4*  AST 94*  --   --   ALT 122*  --   --   ALKPHOS 242*  --   --   BILITOT 1.9*  --   --    < > = values in this interval not displayed.   ------------------------------------------------------------------------------------------------------------------  Cardiac Enzymes Recent Labs  Lab 05/23/18 1141  TROPONINI 0.05*   ------------------------------------------------------------------------------------------------------------------  RADIOLOGY:  Dg Chest Port 1 View  Result Date: 05/25/2018 CLINICAL DATA:  Shortness of breath EXAM: PORTABLE CHEST 1 VIEW COMPARISON:  02/18/2018 FINDINGS: Unchanged cardiomegaly distorted by leftward rotation. There is left-sided porta catheter with tip at the upper  cavoatrial junction. Generalized interstitial coarsening. Layering pleural fluid is again seen on the right at least. No pneumothorax. IMPRESSION: Bronchitic and/or congestive interstitial coarsening with small layering pleural fluid on the right at least. Electronically Signed   By: Monte Fantasia M.D.   On: 05/25/2018 05:26    EKG:   Orders placed or performed during the hospital encounter of 05/23/18  . EKG 12-Lead  . EKG 12-Lead  . ED EKG  . ED EKG    ASSESSMENT AND PLAN:   #1. acute pancreatitis likely secondary to Onglyza, discontinue Onglyza, given the lipase is outpatient has no abdominal pain, nausea.  Continue conservative treatment for another 24 hours, likely discharge home tomorrow.  Patient agrees for this plan.  Patient wanted to advance the diet to regular diet yesterday evening and he is tolerating the diet, denies abdominal pain even though lipase is elevated.   2.  Diabetes mellitus type 2: Glycemic episodes, check hemoglobin A1c, decrease dose of glipizide to 5 mg.  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  4.  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  5.  History of liver cirrhosis, hep C.  Hold statins as LFTs is elevated, #5  Acute on chronic renal failure with CKD stage III: Monitor closely, avoid nephrotoxic agents.  ..  6.  History of neuropathy: Continue Cymbalta, Neurontin.     #7 tobacco abuse, patient on Chantix.  Continue that. #8. acute on chronic diastolic heart failure, improved with Lasix, will give prescription for Lasix at home, patient told me that he is having these problems at home also. Can get privileges to goout with staff for 5 minutes.  Patient slid out of wheelchair, cannot go alone, due to  safety issues.needs hospital staff or volunteer    All the records are reviewed and case discussed with Care Management/Social Workerr. Management plans discussed with the patient, family and they are in  agreement.  CODE STATUS: Full code  TOTAL TIME TAKING CARE OF THIS PATIENT: 40 minutes.   POSSIBLE D/C IN 1-2 DAYS, DEPENDING ON CLINICAL CONDITION.   More than 50% time spent in counseling, coordination of care  [name] M.D on 05/25/2018 at 11:24 AM  Between 7am to 6pm - Pager - [phone]  After 6pm go to [email or web address] - password [email or web address] [facility]  Office  [phone]  CC: Primary care physician; [name], MD   Note: This dictation was prepared with Dragon dictation along with smaller phrase technology. Any transcriptional errors that result from this process are unintentional.

## 2018-05-25 NOTE — Progress Notes (Signed)
MD notified that pt states that he feels SOB while laying flat and that his left leg is starting to swell. RN assessed pt and he does not look like he is in any respiratory distress and his left leg is having +2 edema. Verbal order to order lasix 20 mg BID. Pt made aware.   Vibhav Waddill [employer]

## 2018-05-25 NOTE — Progress Notes (Signed)
Dr. Jerelyn Charles notified of SOB when lying down and 3+ edema; acknowledged; new orders written: stat BMP, stat PCXR, discontinue IVF and change to SL and Lasix 20 mg IVP X1; Labs drawn from [facility], POC flushed with 10 ml with positive pressure. Will continue to monitor. Barbaraann Faster, RN 4:20 AM 05/25/2018

## 2018-05-25 NOTE — Progress Notes (Signed)
Verbal order from MD  to restart home medication gabapentin 300 mg TID.   [name] [employer]

## 2018-05-25 NOTE — Progress Notes (Signed)
Pt complaining of left rib pain states he hit his side earlier today and is requesting a chest x ray, message to MD.

## 2018-05-25 NOTE — Progress Notes (Signed)
Patient complains of SOB when lying down and swelling in LEs; Diminished breaths sound in bilateral Lower lobes, no crackles or wheezing noted; 3+ edema noted in LLE and 2-3+ edema noted in RLE; Dr. Jerelyn Charles paged; awaiting callback. Barbaraann Faster, RN 3:52 AM 05/25/2018

## 2018-05-25 NOTE — Care Management Important Message (Signed)
Copy of signed Medicare IM left with patient in room. 

## 2018-05-25 NOTE — Progress Notes (Signed)
Notified Md pt complaints of "left rib" pain, chest x ray ordered.

## 2018-05-26 ENCOUNTER — Inpatient Hospital Stay: Payer: Medicare Other

## 2018-05-26 LAB — GLUCOSE, CAPILLARY
Glucose-Capillary: 73 mg/dL (ref 70–99)
Glucose-Capillary: 88 mg/dL (ref 70–99)
Glucose-Capillary: 101 mg/dL — ABNORMAL HIGH (ref 70–99)
Glucose-Capillary: 123 mg/dL — ABNORMAL HIGH (ref 70–99)

## 2018-05-26 LAB — COMPREHENSIVE METABOLIC PANEL
ALT: 100 U/L — ABNORMAL HIGH (ref 0–44)
AST: 113 U/L — ABNORMAL HIGH (ref 15–41)
Albumin: 2.8 g/dL — ABNORMAL LOW (ref 3.5–5.0)
Alkaline Phosphatase: 316 U/L — ABNORMAL HIGH (ref 38–126)
Anion gap: 6 (ref 5–15)
BUN: 26 mg/dL — ABNORMAL HIGH (ref 8–23)
CHLORIDE: 106 mmol/L (ref 98–111)
CO2: 23 mmol/L (ref 22–32)
Calcium: 8.7 mg/dL — ABNORMAL LOW (ref 8.9–10.3)
Creatinine, Ser: 1.43 mg/dL — ABNORMAL HIGH (ref 0.61–1.24)
GFR calc Af Amer: 60 mL/min — ABNORMAL LOW (ref >60)
GFR calc non Af Amer: 52 mL/min — ABNORMAL LOW (ref >60)
GLUCOSE: 84 mg/dL (ref 70–99)
Potassium: 4.4 mmol/L (ref 3.5–5.1)
SODIUM: 135 mmol/L (ref 135–145)
Total Bilirubin: 3.6 mg/dL — ABNORMAL HIGH (ref 0.3–1.2)
Total Protein: 6.7 g/dL (ref 6.5–8.1)

## 2018-05-26 LAB — LIPASE, BLOOD: Lipase: 748 U/L — ABNORMAL HIGH (ref 11–51)

## 2018-05-26 LAB — H. PYLORI ANTIGEN, STOOL: H. Pylori Stool Ag, Eia: NEGATIVE

## 2018-05-26 MED ORDER — GLIPIZIDE 5 MG PO TABS: 5.0000 mg | ORAL_TABLET | Freq: Every day | ORAL | 0 refills | Status: AC

## 2018-05-26 MED ORDER — FUROSEMIDE 20 MG PO TABS: 20.0000 mg | ORAL_TABLET | Freq: Two times a day (BID) | ORAL | 0 refills | Status: DC

## 2018-05-26 NOTE — Progress Notes (Signed)
[facility] [place] NAME: Shawn Cardenas    MR#:  [id number]  DATE OF BIRTH:  31-Dec-1955  SUBJECTIVE: Admitted for acute pancreatitis secondary to [place], stopped Onglyza, patient lipase decreased after stopping Onglyza, patient wanted to eat regular food, started on regular food as he wanted to eat pizza.  But lipase is going up again, patient also has abdominal pain, nausea today.  Start back on clear liquid diet, GI consult requested.  Patient states that his ribs are hurting on the left side, had a fall from wheelchair.  Patient chest x-ray showed pulmonary vascular congestion but negative for rib fractures.  Patient wants to take shower but for port is accessed...  CHIEF COMPLAINT:   Chief Complaint  Patient presents with  . Chest Pain  . Abdominal Pain    REVIEW OF SYSTEMS:   ROS CONSTITUTIONAL: No fever, fatigue or weakness.  EYES: No blurred or double vision.  EARS, NOSE, AND THROAT: No tinnitus or ear pain.  RESPIRATORY: No cough, shortness of breath, wheezing or hemoptysis.  CARDIOVASCULAR: No chest pain, orthopnea, edema.  GASTROINTESTINAL: She has, has abdominal pain.  GENITOURINARY: No dysuria, hematuria.  ENDOCRINE: No polyuria, nocturia,  HEMATOLOGY: No anemia, easy bruising or bleeding SKIN: No rash or lesion. MUSCULOSKELETAL: No joint pain or arthritis. Right bka  NEUROLOGIC: No tingling, numbness, weakness.  PSYCHIATRY: Anxiety, depression present  DRUG ALLERGIES:   Allergies  Allergen Reactions  . Aspirin Other (See Comments)    bleeding bleeding ASA contraindicated with bleeding disorder  . Other Other (See Comments)    NSAIDS contraindicated with bleeding disorder.    VITALS:  Blood pressure 119/82, pulse 91, temperature 98 F (36.7 C), temperature source Oral, resp. rate 20, height 5\' 8"  (1.727 m), weight 104.3 kg, SpO2 97 %.  PHYSICAL EXAMINATION:  GENERAL:  63 y.o.-year-old patient lying in the bed  with no acute distress.  EYES: Pupils equal, round, reactive to light and accommodation. No scleral icterus. Extraocular muscles intact.  HEENT: Head atraumatic, normocephalic. Oropharynx and nasopharynx clear.  NECK:  Supple, no jugular venous distention. No thyroid enlargement, no tenderness.  LUNGS: Normal breath sounds bilaterally, no wheezing, rales,rhonchi or crepitation. No use of accessory muscles of respiration.  Complains of left rib pains. CARDIOVASCULAR: S1, S2 normal. No murmurs, rubs, or gallops.  ABDOMEN: Epigastric tenderness present, bowel sounds present, eXTREMITIES: No pedal edema, cyanosis, or clubbing. Right BKA NEUROLOGIC: Cranial nerves II through XII are intact. Muscle strength 5/5 in all extremities. Sensation intact. Gait not checked.  PSYCHIATRIC: The patient is alert and oriented x 3.  SKIN: No obvious rash, lesion, or ulcer.    LABORATORY PANEL:   CBC Recent Labs  Lab 05/24/18 0430  WBC 8.7  HGB 11.1*  HCT 36.0*  PLT 142*   ------------------------------------------------------------------------------------------------------------------  Chemistries  Recent Labs  Lab 05/26/18 0616  NA 135  K 4.4  CL 106  CO2 23  GLUCOSE 84  BUN 26*  CREATININE 1.43*  CALCIUM 8.7*  AST 113*  ALT 100*  ALKPHOS 316*  BILITOT 3.6*   ------------------------------------------------------------------------------------------------------------------  Cardiac Enzymes Recent Labs  Lab 05/23/18 1141  TROPONINI 0.05*   ------------------------------------------------------------------------------------------------------------------  RADIOLOGY:  Dg Chest 2 View  Result Date: 05/25/2018 CLINICAL DATA:  Left chest pain since this afternoon. [address] out of wheelchair this morning. EXAM: CHEST - 2 VIEW COMPARISON:  05/25/2018 FINDINGS: Cardiac enlargement with prominent pulmonary vascularity. Central interstitial pattern to the lungs likely interstitial edema. Small  bilateral pleural effusions. Fluid in the fissures. No consolidation or pneumothorax. Mediastinal contours appear intact. Left central vascular line with tip over the cavoatrial junction region. Degenerative changes in both shoulders with degenerative fragmentation versus postoperative change in the distal right clavicle and acromion. IMPRESSION: Cardiac enlargement with pulmonary vascular congestion and interstitial edema. Small bilateral pleural effusions. No focal consolidation. Electronically Signed   By: Lucienne Capers M.D.   On: 05/25/2018 22:28   Dg Chest Port 1 View  Result Date: 05/25/2018 CLINICAL DATA:  Shortness of breath EXAM: PORTABLE CHEST 1 VIEW COMPARISON:  02/18/2018 FINDINGS: Unchanged cardiomegaly distorted by leftward rotation. There is left-sided porta catheter with tip at the upper cavoatrial junction. Generalized interstitial coarsening. Layering pleural fluid is again seen on the right at least. No pneumothorax. IMPRESSION: Bronchitic and/or congestive interstitial coarsening with small layering pleural fluid on the right at least. Electronically Signed   By: Monte Fantasia M.D.   On: 05/25/2018 05:26    EKG:   Orders placed or performed during the hospital encounter of 05/23/18  . EKG 12-Lead  . EKG 12-Lead  . ED EKG  . ED EKG    ASSESSMENT AND PLAN:   #1. acute pancreatitis likely secondary to Onglyza, discontinue Onglyza,  lipase creeping up, switch to liquid diet, GI consult, trend lipase again..   2.  Diabetes mellitus type 2: Hold glipizide as patient is on liquid diet, continue sliding scale insulin with coverage.   hold Onglyza because of acute pancreatitis.  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  GI consult requested at this time because of acute pancreatitis.  4.  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  5.  History of liver cirrhosis, hep C.  Hold statins as LFTs is elevated, #5  Acute on chronic renal  failure with CKD stage III: Monitor closely, avoid nephrotoxic agents.  .. Renal function slightly better.  6.  History of neuropathy: Continue Cymbalta, Neurontin.     #7 tobacco abuse, patient on Chantix.  Continue that. #8. acute on chronic diastolic heart failure, continue Lasix.    #9 rib pain due to fall from wheelchair, x-ray of the chest is negative for rib fractures, will order x-ray of the ribs. 10.  Patient was to take shower, de-access the port, insert peripheral IV, cover the peripheral IV for shower,     All the records are reviewed and case discussed with Care Management/Social Workerr. Management plans discussed with the patient, family and they are in agreement.  CODE STATUS: Full code  TOTAL TIME TAKING CARE OF THIS PATIENT: 40 minutes.   POSSIBLE D/C IN 1-2 DAYS, DEPENDING ON CLINICAL CONDITION.   More than 50% time spent in counseling, coordination of care  [name] M.D on 05/26/2018 at 11:39 AM  Between 7am to 6pm - Pager - [phone]  After 6pm go to [email or web address] - password [email or web address] [facility]  Office  [phone]  CC: Primary care physician; [name], MD   Note: This dictation was prepared with Dragon dictation along with smaller phrase technology. Any transcriptional errors that result from this process are unintentional.

## 2018-05-27 ENCOUNTER — Inpatient Hospital Stay: Payer: Medicare Other

## 2018-05-27 DIAGNOSIS — K859 Acute pancreatitis without necrosis or infection, unspecified: Principal | ICD-10-CM

## 2018-05-27 LAB — GLUCOSE, CAPILLARY
Glucose-Capillary: 94 mg/dL (ref 70–99)
Glucose-Capillary: 124 mg/dL — ABNORMAL HIGH (ref 70–99)
Glucose-Capillary: 91 mg/dL (ref 70–99)
Glucose-Capillary: 150 mg/dL — ABNORMAL HIGH (ref 70–99)

## 2018-05-27 LAB — LIPASE, BLOOD: Lipase: 444 U/L — ABNORMAL HIGH (ref 11–51)

## 2018-05-27 MED ORDER — GADOBUTROL 1 MMOL/ML IV SOLN
10.0000 mL | Freq: Once | INTRAVENOUS | Status: AC | PRN
  Administered 2018-05-27: 10 mL via INTRAVENOUS

## 2018-05-27 NOTE — Progress Notes (Signed)
Order received for MRCP and diet change to heart healthy, carb diet

## 2018-05-27 NOTE — Consult Note (Signed)
Shawn Cardenas , MD [address] [place], [place], [place], [address] [address], [place], [place], [place], [address] Phone: [phone]  Fax: [phone]  Consultation  Referring Provider:    Dr Vianne Bulls  Primary Care Physician:  [name], MD Primary Gastroenterologist:  Dr. Gustavo Lah         Reason for Consultation:     Pancreatitis  Date of Admission:  05/23/2018 Date of Consultation:  05/27/2018         HPI:   Shawn Cardenas is a 63 y.o. male who is known to [facility] is in fact been seen by Dr. Gustavo Lah on 05/23/2018 for acute pancreatitis.  When seen by Dr. Gustavo Lah he had elevated transaminases alkaline phosphatase and lipase.  Subsequently had a CT scan performed of his abdomen and pelvis which showed changes of cirrhosis, ascites and bilateral pleural effusions.  Has been on medications for his diabetes including Onglyza and Januvia check been attributed to pancreatitis.  With concern of pancreatitis he was referred to ER.  Liver function tests when checked yesterday are still elevated with an alkaline phosphatase of 316, AST 113, ALT 100 and total bilirubin of 3.6.  I was called earlier this morning to see the patient for pancreatitis.  Presently has no pain, no prior history of pancreatitis, no family history of pancreatic cancer or pancreatitis. Uses snuff , no new medications , no mushrooms, herbal medications or internet medications used. Able to tolerate a liquid diet. Complains of dark urine and pale stools recently.  Not sure of any weight loss as he cannot stand on the scale.   Past Medical History:  Diagnosis Date  . Anxiety   . Arthritis   . Blood transfusion without reported diagnosis   . Clotting disorder ([place])   . Depression   . Diabetes mellitus without complication ([place])   . GERD (gastroesophageal reflux disease)   . Hypertension   . Neuromuscular disorder [facility])     Past Surgical History:  Procedure Laterality Date  . CHOLECYSTECTOMY      . LEG AMPUTATION Right     Prior to Admission medications   Medication Sig Start Date End Date Taking? Authorizing Provider  Antihemophilic Factor, Recomb, 7322 UNITS KIT Inject into the vein daily. Halixate or Kogenate 3 times weekly or every 12-24 hours as needed   Yes [provider]  celecoxib (CELEBREX) 200 MG capsule Take 200 mg by mouth daily as needed.    Yes [provider]  cephALEXin (KEFLEX) 500 MG capsule Take 1 capsule by mouth 2 (two) times daily. 03/09/17  Yes [provider]  DULoxetine (CYMBALTA) 60 MG capsule Take 60 mg by mouth daily.    Yes [provider]  esomeprazole (NEXIUM) 40 MG capsule Take 40 mg by mouth daily.    Yes [provider]  FLUoxetine (PROZAC) 20 MG capsule Take 20 mg by mouth daily.   Yes [provider]  furosemide (LASIX) 20 MG tablet Take 20 mg by mouth daily. 04/08/18  Yes [provider]  gabapentin (NEURONTIN) 300 MG capsule Take 300 mg by mouth 3 (three) times daily.    Yes [provider]  glipiZIDE (GLUCOTROL XL) 10 MG 24 hr tablet Take 1 tablet by mouth daily. 03/09/17  Yes [provider]  lisinopril (PRINIVIL,ZESTRIL) 10 MG tablet Take 1 tablet by mouth daily. 03/09/17  Yes [provider]  NARCAN 4 MG/0.1ML LIQD nasal spray kit Place 1 spray into both nostrils every 5 (  five) minutes as needed. 03/14/17  Yes [provider]  oxyCODONE (OXYCONTIN) 10 mg 12 hr tablet Take 10 mg by mouth 2 (two) times daily as needed.    Yes [provider]  oxyCODONE (OXYCONTIN) 40 mg 12 hr tablet Take 40 mg by mouth daily.   Yes [provider]  pravastatin (PRAVACHOL) 40 MG tablet Take 1 tablet (40 mg total) by mouth daily. 02/27/18  Yes Gollan, Kathlene November, MD  saxagliptin HCl (ONGLYZA) 5 MG TABS tablet Take 5 mg by mouth daily.    Yes [provider]  tamsulosin (FLOMAX) 0.4 MG CAPS capsule Take 0.4 mg by mouth daily.    Yes [provider]  CHANTIX 1 MG tablet Take 1 tablet by mouth 2 (two) times daily. 03/15/17   [provider]  furosemide (LASIX) 20 MG tablet Take 1 tablet (20 mg total) by mouth 2 (two) times daily. 05/26/18   [name], MD  glipiZIDE (GLUCOTROL) 5 MG tablet Take 1 tablet (5 mg total) by mouth daily before breakfast. 05/26/18 05/26/19  [name], MD    Family History  Problem Relation Age of Onset  . Heart Problems Mother   . Heart attack Father   . Heart attack Brother   . Heart attack Cousin      Social History   Tobacco Use  . Smoking status: Current Every Day Smoker    Packs/day: 2.00    Years: 20.00    Pack years: 40.00    Types: Cigarettes    Last attempt to quit: 04/24/1978    Years since quitting: 40.1  . Smokeless tobacco: Never Used  Substance Use Topics  . Alcohol use: No  . Drug use: No    Allergies as of 05/23/2018 - Review Complete 05/23/2018  Allergen Reaction Noted  . Aspirin Other (See Comments) 09/10/2014  . Other Other (See Comments) 07/10/2015    Review of Systems:    All systems reviewed and negative except where noted in HPI.   Physical Exam:  Vital signs in last 24 hours: Temp:  [97.6 F (36.4 C)-98.4 F (36.9 C)] 98 F (36.7 C) (02/02 0512) Pulse Rate:  [92-104] 97 (02/02 0932) Resp:  [20] 20 (02/02 0512) BP: (120-136)/(80-92) 124/80 (02/02 0932) SpO2:  [100 %] 100 % (02/02 0512) Last BM Date: 05/26/18 General:   Pleasant, cooperative in NAD Head:  Normocephalic and atraumatic. Eyes:   No icterus.   Conjunctiva pink. PERRLA. Ears:  Normal auditory acuity. Neck:  Supple; no masses or thyroidomegaly Lungs: Respirations even and unlabored. Lungs clear to auscultation bilaterally.   No wheezes, crackles, or rhonchi.  Heart:  Regular rate and rhythm;  Without murmur, clicks, rubs or gallops Abdomen:  Soft, nondistended, nontender. Normal bowel sounds. No appreciable masses or hepatomegaly.  No rebound or guarding.    Neurologic:  Alert and oriented x3;  grossly normal neurologically. Skin:  Intact without significant lesions or rashes.[place] over skin  Cervical Nodes:  No significant cervical adenopathy. Psych:  Alert and cooperative. Normal affect. Left lower limb has metal rods after surgery  LAB RESULTS: No results for input(s): WBC, HGB, HCT, PLT in the last 72 hours. BMET Recent Labs    05/25/18 0414 05/26/18 0616  NA 135 135  K 4.4 4.4  CL 105 106  CO2 23 23  GLUCOSE 102* 84  BUN 27* 26*  CREATININE 1.54* 1.43*  CALCIUM 8.4* 8.7*   LFT Recent Labs    05/26/18 0616  PROT 6.7  ALBUMIN 2.8*  AST 113*  ALT 100*  ALKPHOS 316*  BILITOT 3.6*   PT/INR No results for input(s): LABPROT, INR in the last 72 hours.  STUDIES: Dg Chest 2 View  Result Date: 05/25/2018 CLINICAL DATA:  Left chest pain since this afternoon. [address] out of wheelchair this morning. EXAM: CHEST - 2 VIEW COMPARISON:  05/25/2018 FINDINGS: Cardiac enlargement with prominent pulmonary vascularity. Central interstitial pattern to the lungs likely interstitial edema. Small bilateral pleural effusions. Fluid in the fissures. No consolidation or pneumothorax. Mediastinal contours appear intact. Left central vascular line with tip over the cavoatrial junction region. Degenerative changes in both shoulders with degenerative fragmentation versus postoperative change in the distal right clavicle and acromion. IMPRESSION: Cardiac enlargement with pulmonary vascular congestion and interstitial edema. Small bilateral pleural effusions. No focal consolidation. Electronically Signed   By: Lucienne Capers M.D.   On: 05/25/2018 22:28   Dg Ribs Unilateral Left  Result Date: 05/26/2018 CLINICAL DATA:  Pt states he fell out of his wheelchair yesterday. Left axillary rib pain since. Pt admitted 05/23/2018 for pancreatitis. Hx - HTN, neuromuscular disorder, DM, current smoker 2 ppd. EXAM: LEFT RIBS - 2 VIEW COMPARISON:  Current chest  radiographs. Prior chest radiograph dated 02/18/2018. FINDINGS: No fracture or other bone lesions are seen involving the ribs. IMPRESSION: Negative. Electronically Signed   By: Lajean Manes M.D.   On: 05/26/2018 14:28      Impression / Plan:   Masin Shatto is a 63 y.o. y/o male with his post cholecystectomy, cirrhosis of liver, prior hepatitis C status post treatment with interferon many years back and said to have been cured.  Seen by Dr. Gustavo Lah in the clinic on 05/23/2018 for abdominal pain and diagnosed with pancreatitis due to elevated lipase.  CT scan of the abdomen did show mild pancreatic inflammation.  I do note that his liver function tests have been elevated.  His total bilirubin is also elevated at 3.6.  Although his pancreatitis has been attributed to his diabetes medication, I think it is important to rule out a stone in the common bile duct which he can develop despite having a prior cholecystectomy.  He has a history of hemophilia as well.  Plan 1.  Obtain MRCP to rule out stones in the common bile duct.  If positive will need an ERCP.He has metal implants in his leg but says he has had mri in the past 2.  IV fluids and analgesia as needed. 3.  There is no role in checking serial lipase levels.  Lipase is predominantly only useful on initial presentation for diagnosing pancreatitis.  It does not have significant prognostication value. 4. Advance diet as tolerated.    Thank you for involving me in the care of this patient.      LOS: 4 days   Shawn Bellows, MD  05/27/2018, 10:52 AM

## 2018-05-27 NOTE — Progress Notes (Signed)
[facility] [place] NAME: Shawn Cardenas    MR#:  [id number]  DATE OF BIRTH:  [birth year]  SUBJECTIVE:  lipase is down to 400.  No abdominal pain, no nausea today.  Feeling better.  CHIEF COMPLAINT:   Chief Complaint  Patient presents with  . Chest Pain  . Abdominal Pain    REVIEW OF SYSTEMS:   ROS CONSTITUTIONAL: No fever, fatigue or weakness.  EYES: No blurred or double vision.  EARS, NOSE, AND THROAT: No tinnitus or ear pain.  RESPIRATORY: No cough, shortness of breath, wheezing or hemoptysis.  CARDIOVASCULAR: No chest pain, orthopnea, edema.  GASTROINTESTINAL: No abdominal pain or nausea today.  ENDOCRINE: No polyuria, nocturia,  HEMATOLOGY: No anemia, easy bruising or bleeding SKIN: No rash or lesion. MUSCULOSKELETAL: No joint pain or arthritis. Right bka  NEUROLOGIC: No tingling, numbness, weakness.  PSYCHIATRY: Anxiety, depression present  DRUG ALLERGIES:   Allergies  Allergen Reactions  . Aspirin Other (See Comments)    bleeding bleeding ASA contraindicated with bleeding disorder  . Other Other (See Comments)    NSAIDS contraindicated with bleeding disorder.    VITALS:  Blood pressure 124/80, pulse 97, temperature 98 F (36.7 C), temperature source Oral, resp. rate 20, height 5\' 8"  (1.727 m), weight 104.3 kg, SpO2 100 %.  PHYSICAL EXAMINATION:  GENERAL:  63 y.o.-year-old patient lying in the bed with no acute distress.  EYES: Pupils equal, round, reactive to light and accommodation. No scleral icterus. Extraocular muscles intact.  HEENT: Head atraumatic, normocephalic. Oropharynx and nasopharynx clear.  NECK:  Supple, no jugular venous distention. No thyroid enlargement, no tenderness.  LUNGS: Normal breath sounds bilaterally, no wheezing, rales,rhonchi or crepitation. No use of accessory muscles of respiration.  Complains of left rib pains. CARDIOVASCULAR: S1, S2 normal. No murmurs, rubs, or gallops.  ABDOMEN:  E no abdominal tenderness, bowel sounds present.   Right BKA NEUROLOGIC: Cranial nerves II through XII are intact. Muscle strength 5/5 in all extremities. Sensation intact. Gait not checked.  PSYCHIATRIC: The patient is alert and oriented x 3.  SKIN: No obvious rash, lesion, or ulcer.    LABORATORY PANEL:   CBC Recent Labs  Lab 05/24/18 0430  WBC 8.7  HGB 11.1*  HCT 36.0*  PLT 142*   ------------------------------------------------------------------------------------------------------------------  Chemistries  Recent Labs  Lab 05/26/18 0616  NA 135  K 4.4  CL 106  CO2 23  GLUCOSE 84  BUN 26*  CREATININE 1.43*  CALCIUM 8.7*  AST 113*  ALT 100*  ALKPHOS 316*  BILITOT 3.6*   ------------------------------------------------------------------------------------------------------------------  Cardiac Enzymes Recent Labs  Lab 05/23/18 1141  TROPONINI 0.05*   ------------------------------------------------------------------------------------------------------------------  RADIOLOGY:  Dg Chest 2 View  Result Date: 05/25/2018 CLINICAL DATA:  Left chest pain since this afternoon. [place] [address] out of wheelchair this morning. EXAM: CHEST - 2 VIEW COMPARISON:  05/25/2018 FINDINGS: Cardiac enlargement with prominent pulmonary vascularity. Central interstitial pattern to the lungs likely interstitial edema. Small bilateral pleural effusions. Fluid in the fissures. No consolidation or pneumothorax. Mediastinal contours appear intact. Left central vascular line with tip over the cavoatrial junction region. Degenerative changes in both shoulders with degenerative fragmentation versus postoperative change in the distal right clavicle and acromion. IMPRESSION: Cardiac enlargement with pulmonary vascular congestion and interstitial edema. Small bilateral pleural effusions. No focal consolidation. Electronically Signed   By: Lucienne Capers M.D.   On: 05/25/2018 22:28   Dg Ribs Unilateral  Left  Result Date: 05/26/2018 CLINICAL DATA:  Pt states he fell out of his wheelchair yesterday. Left axillary rib pain since. Pt admitted 05/23/2018 for pancreatitis. Hx - HTN, neuromuscular disorder, DM, current smoker 2 ppd. EXAM: LEFT RIBS - 2 VIEW COMPARISON:  Current chest radiographs. Prior chest radiograph dated 02/18/2018. FINDINGS: No fracture or other bone lesions are seen involving the ribs. IMPRESSION: Negative. Electronically Signed   By: Lajean Manes M.D.   On: 05/26/2018 14:28    EKG:   Orders placed or performed during the hospital encounter of 05/23/18  . EKG 12-Lead  . EKG 12-Lead  . ED EKG  . ED EKG    ASSESSMENT AND PLAN:   #1. acute pancreatitis likely secondary to Onglyza, discontinued Onglyza, patient lipase went up as we advance her the diet change her back to liquid diet, now lipase is trending down.  GI consult requested, spoke with Dr. Caleen Jobs.   2.  Diabetes mellitus type 2: Hold glipizide as patient is on liquid diet, continue sliding scale insulin with coverage.   hold Onglyza because of acute pancreatitis.  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  GI consult requested at this time because of acute pancreatitis.,  Elevated LFTs.  4.  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  5.  History of liver cirrhosis, hep C.  Hold statins as LFTs is elevated, #5  Acute on chronic renal failure with CKD stage III: Monitor closely, avoid nephrotoxic agents.  .. 6.  History of neuropathy: Continue Cymbalta, Neurontin.     #7 tobacco abuse, patient on Chantix.  Continue that. #8. acute on chronic diastolic heart failure, continue Lasix.    #9 rib pain due to fall from wheelchair, likely bruise, rib x-ray negative for fractures.       All the records are reviewed and case discussed with Care Management/Social Workerr. Management plans discussed with the patient, family and they are in agreement.  CODE STATUS: Full  code  TOTAL TIME TAKING CARE OF THIS PATIENT: 40 minutes.   POSSIBLE D/C IN 1-2 DAYS, DEPENDING ON CLINICAL CONDITION.   More than 50% time spent in counseling, coordination of care  [name] M.D on 05/27/2018 at 9:49 AM  Between 7am to 6pm - Pager - [phone]  After 6pm go to [email or web address] - password [email or web address] [facility]  Office  [phone]  CC: Primary care physician; [name], MD   Note: This dictation was prepared with Dragon dictation along with smaller phrase technology. Any transcriptional errors that result from this process are unintentional.

## 2018-05-28 ENCOUNTER — Ambulatory Visit: Admission: RE | Admit: 2018-05-28 | Payer: Medicare Other | Source: Ambulatory Visit

## 2018-05-28 DIAGNOSIS — K869 Disease of pancreas, unspecified: Secondary | ICD-10-CM

## 2018-05-28 DIAGNOSIS — K831 Obstruction of bile duct: Secondary | ICD-10-CM

## 2018-05-28 LAB — GLUCOSE, CAPILLARY
Glucose-Capillary: 93 mg/dL (ref 70–99)
Glucose-Capillary: 126 mg/dL — ABNORMAL HIGH (ref 70–99)
Glucose-Capillary: 83 mg/dL (ref 70–99)
Glucose-Capillary: 209 mg/dL — ABNORMAL HIGH (ref 70–99)

## 2018-05-28 LAB — HEPATIC FUNCTION PANEL
ALT: 92 U/L — ABNORMAL HIGH (ref 0–44)
AST: 86 U/L — ABNORMAL HIGH (ref 15–41)
Albumin: 2.8 g/dL — ABNORMAL LOW (ref 3.5–5.0)
Alkaline Phosphatase: 307 U/L — ABNORMAL HIGH (ref 38–126)
Bilirubin, Direct: 3.5 mg/dL — ABNORMAL HIGH (ref 0.0–0.2)
Indirect Bilirubin: 1.5 mg/dL — ABNORMAL HIGH (ref 0.3–0.9)
Total Bilirubin: 5 mg/dL — ABNORMAL HIGH (ref 0.3–1.2)
Total Protein: 6.9 g/dL (ref 6.5–8.1)

## 2018-05-28 LAB — LIPASE, BLOOD: Lipase: 738 U/L — ABNORMAL HIGH (ref 11–51)

## 2018-05-28 MED ORDER — LACTATED RINGERS IV SOLN: INTRAVENOUS | Status: DC

## 2018-05-28 NOTE — Progress Notes (Signed)
[place] [facility] [place] NAME: Shawn Cardenas    MR#:  [id number]  DATE OF BIRTH:  [birth year]  Lipase elevated, denies abdominal pain.  Patient is scheduled for ERCP tomorrow.  CHIEF COMPLAINT:   Chief Complaint  Patient presents with  . Chest Pain  . Abdominal Pain    REVIEW OF SYSTEMS:   ROS CONSTITUTIONAL: No fever, fatigue or weakness.  EYES: No blurred or double vision.  EARS, NOSE, AND THROAT: No tinnitus or ear pain.  RESPIRATORY: No cough, shortness of breath, wheezing or hemoptysis.  CARDIOVASCULAR: No chest pain, orthopnea, edema.  GASTROINTESTINAL: No abdominal pain or nausea today.  ENDOCRINE: No polyuria, nocturia,  HEMATOLOGY: No anemia, easy bruising or bleeding SKIN: No rash or lesion. MUSCULOSKELETAL: No joint pain or arthritis. Right bka  NEUROLOGIC: No tingling, numbness, weakness.  PSYCHIATRY: Anxiety, depression present  DRUG ALLERGIES:   Allergies  Allergen Reactions  . Aspirin Other (See Comments)    bleeding bleeding ASA contraindicated with bleeding disorder  . Other Other (See Comments)    NSAIDS contraindicated with bleeding disorder.    VITALS:  Blood pressure 132/90, pulse 94, temperature 98.1 F (36.7 C), temperature source Oral, resp. rate 19, height 5\' 8"  (1.727 m), weight 104.3 kg, SpO2 100 %.  PHYSICAL EXAMINATION:  GENERAL:  63 y.o.-year-old patient lying in the bed with no acute distress.  EYES: Pupils equal, round, reactive to light and accommodation. No scleral icterus. Extraocular muscles intact.  HEENT: Head atraumatic, normocephalic. Oropharynx and nasopharynx clear.  NECK:  Supple, no jugular venous distention. No thyroid enlargement, no tenderness.  LUNGS: Normal breath sounds bilaterally, no wheezing, rales,rhonchi or crepitation. No use of accessory muscles of respiration.  Complains of left rib pains. CARDIOVASCULAR: S1, S2 normal. No murmurs, rubs, or gallops.  ABDOMEN: E no  abdominal tenderness, bowel sounds present.   Right BKA NEUROLOGIC: Cranial nerves II through XII are intact. Muscle strength 5/5 in all extremities. Sensation intact. Gait not checked.  PSYCHIATRIC: The patient is alert and oriented x 3.  SKIN: No obvious rash, lesion, or ulcer.    LABORATORY PANEL:   CBC Recent Labs  Lab 05/24/18 0430  WBC 8.7  HGB 11.1*  HCT 36.0*  PLT 142*   ------------------------------------------------------------------------------------------------------------------  Chemistries  Recent Labs  Lab 05/26/18 0616 05/28/18 0432  NA 135  --   K 4.4  --   CL 106  --   CO2 23  --   GLUCOSE 84  --   BUN 26*  --   CREATININE 1.43*  --   CALCIUM 8.7*  --   AST 113* 86*  ALT 100* 92*  ALKPHOS 316* 307*  BILITOT 3.6* 5.0*   ------------------------------------------------------------------------------------------------------------------  Cardiac Enzymes Recent Labs  Lab 05/23/18 1141  TROPONINI 0.05*   ------------------------------------------------------------------------------------------------------------------  RADIOLOGY:  Mr 3d Recon At Scanner  Result Date: 05/27/2018 CLINICAL DATA:  Acute pancreatitis EXAM: MRI ABDOMEN WITHOUT AND WITH CONTRAST (INCLUDING MRCP) TECHNIQUE: Multiplanar multisequence MR imaging of the abdomen was performed both before and after the administration of intravenous contrast. Heavily T2-weighted images of the biliary and pancreatic ducts were obtained, and three-dimensional MRCP images were rendered by post processing. CONTRAST:  10 cc Gadavist COMPARISON:  CT scan 05/22/2018 FINDINGS: Markedly motion degraded study. Lower chest: Moderate right and small left pleural effusions. Hepatobiliary: No suspicious focal abnormality within the liver parenchyma. Mild intrahepatic biliary duct dilatation evident the extrahepatic common duct measures up to 9 mm diameter. No  evidence for stones within the common duct. Abrupt  stricture ring of the common bile duct noted as it enters the head of pancreas. Gallbladder surgically absent. Pancreas: No dilatation of the main pancreatic duct. There is soft tissue fullness in the region of the pancreatic head, but no differential enhancement can be appreciated. No evidence for pancreatic pseudocyst. Spleen:  No splenomegaly. No focal mass lesion. Adrenals/Urinary Tract: No adrenal nodule or mass. 2.2 cm exophytic lesion posterior interpolar left kidney is compatible with a cyst. 1.2 cm cyst is identified in the upper pole of the left kidney. Stomach/Bowel: Stomach is unremarkable. No gastric wall thickening. No evidence of outlet obstruction. Duodenum is normally positioned as is the ligament of Treitz. No small bowel or colonic dilatation within the visualized abdomen. Vascular/Lymphatic: No abdominal aortic aneurysm. There is no gastrohepatic or hepatoduodenal ligament lymphadenopathy. No intraperitoneal or retroperitoneal lymphadenopathy. Other: Diffuse edema is identified within the retroperitoneum, mesentery, and body wall. Musculoskeletal: No abnormal marrow enhancement within the visualized bony anatomy. IMPRESSION: 1. 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 There is some soft tissue fullness in the head of pancreas but no discrete or infiltrative mass can be discerned and there is no associated dilatation of the main pancreatic duct. No findings for choledocholithiasis. ERCP/EUS may prove helpful to further evaluate. Follow-up imaging could be performed when patient is better able to cooperate with positioning and breath holding. 2. Moderate right and small left pleural effusion associated with diffuse edema in the abdomen and body wall. 3. Left renal cysts. Electronically Signed   By: Misty Stanley M.D.   On: 05/27/2018 17:31   Mr Abdomen Mrcp Moise Boring Contast  Result Date: 05/27/2018 CLINICAL  DATA:  Acute pancreatitis EXAM: MRI ABDOMEN WITHOUT AND WITH CONTRAST (INCLUDING MRCP) TECHNIQUE: Multiplanar multisequence MR imaging of the abdomen was performed both before and after the administration of intravenous contrast. Heavily T2-weighted images of the biliary and pancreatic ducts were obtained, and three-dimensional MRCP images were rendered by post processing. CONTRAST:  10 cc Gadavist COMPARISON:  CT scan 05/22/2018 FINDINGS: Markedly motion degraded study. Lower chest: Moderate right and small left pleural effusions. Hepatobiliary: No suspicious focal abnormality within the liver parenchyma. Mild intrahepatic biliary duct dilatation evident the extrahepatic common duct measures up to 9 mm diameter. No evidence for stones within the common duct. 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 Gallbladder surgically absent. Pancreas: No dilatation of the main pancreatic duct. There is soft tissue fullness in the region of the pancreatic head, but no differential enhancement can be appreciated. No evidence for pancreatic pseudocyst. Spleen:  No splenomegaly. No focal mass lesion. Adrenals/Urinary Tract: No adrenal nodule or mass. 2.2 cm exophytic lesion posterior interpolar left kidney is compatible with a cyst. 1.2 cm cyst is identified in the upper pole of the left kidney. Stomach/Bowel: Stomach is unremarkable. No gastric wall thickening. No evidence of outlet obstruction. Duodenum is normally positioned as is the ligament of Treitz. No small bowel or colonic dilatation within the visualized abdomen. Vascular/Lymphatic: No abdominal aortic aneurysm. There is no gastrohepatic or hepatoduodenal ligament lymphadenopathy. No intraperitoneal or retroperitoneal lymphadenopathy. Other: Diffuse edema is identified within the retroperitoneum, mesentery, and body wall. Musculoskeletal: No abnormal marrow enhancement within the visualized bony anatomy. IMPRESSION: 1. Mild  intra and extrahepatic biliary duct dilatation, similar to prior CT with abrupt stricturing of the duct  in the head of pancreas. There is some soft tissue fullness in the head of pancreas but no discrete or infiltrative mass can be discerned and there is no associated dilatation of the main pancreatic duct. No findings for choledocholithiasis. ERCP/EUS may prove helpful to further evaluate. Follow-up imaging could be performed when patient is better able to cooperate with positioning and breath holding. 2. Moderate right and small left pleural effusion associated with diffuse edema in the abdomen and body wall. 3. Left renal cysts. Electronically Signed   By: Misty Stanley M.D.   On: 05/27/2018 17:31    EKG:   Orders placed or performed during the hospital encounter of 05/23/18  . EKG 12-Lead  . EKG 12-Lead  . ED EKG  . ED EKG    ASSESSMENT AND PLAN:   #1. acute pancreatitis'; possible CBD stricture/pancreatic mass: Patient scheduled for ERCP tomorrow.  Continue n.p.o. after midnight.  Spoke with Dr. Valetta Fuller from gastroenterology, also spoke with patient. 2.  Diabetes mellitus type 2: Hold glipizide, continue sliding scale insulin with coverage.   hold Onglyza because of acute pancreatitis.  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 4.  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  5.  History of liver cirrhosis, hep C.  Hold statins as LFTs is elevated, #5  Acute on chronic renal failure with CKD stage III: Monitor closely, avoid nephrotoxic agents.  .. 6.  History of neuropathy: Continue Cymbalta, Neurontin.     #7 tobacco abuse, patient on Chantix.  Continue that. #8. acute on chronic diastolic heart failure, continue Lasix.    #9 rib pain due to fall from wheelchair, likely bruise, rib x-ray negative for fractures.   Possible rib contusion, order incentive spirometry, continue pain medicines.  Right BKA.  Wheelchair-bound at  baseline.   All the records are reviewed and case discussed with Care Management/Social Workerr. Management plans discussed with the patient, family and they are in agreement.  CODE STATUS: Full code  TOTAL TIME TAKING CARE OF THIS PATIENT: 40 minutes.   POSSIBLE D/C IN 1-2 DAYS, DEPENDING ON CLINICAL CONDITION.   More than 50% time spent in counseling, coordination of care  [name] M.D on 05/28/2018 at 2:24 PM  Between 7am to 6pm - Pager - [phone]  After 6pm go to [email or web address] - password [email or web address] [facility]  Office  [phone]  CC: Primary care physician; [name], MD   Note: This dictation was prepared with Dragon dictation along with smaller phrase technology. Any transcriptional errors that result from this process are unintentional.

## 2018-05-28 NOTE — Care Management Important Message (Signed)
Copy of signed Medicare IM left with patient in room. 

## 2018-05-28 NOTE — Progress Notes (Addendum)
Per Dr. Marius Ditch okay for pt to resume previous diet order for today and will be NPO after midnight for ERCP tomorrow. Also hold in iv fluids for today.

## 2018-05-28 NOTE — Progress Notes (Signed)
Shawn Darby, MD [address]  [place]  [place], [place] [address]  Main: [phone]  Fax: [phone] Pager: [phone]   Subjective: No acute events overnight. He denies abdominal pain, n/v. Has been eating MRCP was done yesterday  Objective: Vital signs in last 24 hours: Vitals:   05/27/18 1807 05/27/18 1946 05/28/18 0417 05/28/18 1404  BP: 124/68 131/87 131/87 132/90  Pulse:  89 91 94  Resp:  _0 Temp:  98.7 F (37.1 C) (!) 97.4 F (36.3 C) 98.1 F (36.7 C)  TempSrc:  Oral Oral Oral  SpO2:  99% 99% 100%  Weight:      Height:       Weight change:   Intake/Output Summary (Last 24 hours) at 05/28/2018 1839 Last data filed at 05/28/2018 1407 Gross per 24 hour  Intake 240 ml  Output 1775 ml  Net -1535 ml     Exam: Heart:: Regular rate and rhythm or S1S2 present Lungs: normal and clear to auscultation Abdomen: soft, nontender, normal bowel sounds   Lab Results: CBC Latest Ref Rng & Units 05/24/2018 05/23/2018 02/18/2018  WBC 4.0 - 10.5 K/uL 8.7 9.4 11.6(H)  Hemoglobin 13.0 - 17.0 g/dL 11.1(L) 12.1(L) 11.5(L)  Hematocrit 39.0 - 52.0 % 36.0(L) 38.9(L) 36.5(L)  Platelets 150 - 400 K/uL 142(L) 176 238   CMP Latest Ref Rng & Units 05/28/2018 05/26/2018 05/25/2018  Glucose 70 - 99 mg/dL - 84 102(H)  BUN 8 - 23 mg/dL - 26(H) 27(H)  Creatinine 0.61 - 1.24 mg/dL - 1.43(H) 1.54(H)  Sodium 135 - 145 mmol/L - 135 135  Potassium 3.5 - 5.1 mmol/L - 4.4 4.4  Chloride 98 - 111 mmol/L - 106 105  CO2 22 - 32 mmol/L - 23 23  Calcium 8.9 - 10.3 mg/dL - 8.7(L) 8.4(L)  Total Protein 6.5 - 8.1 g/dL 6.9 6.7 -  Total Bilirubin 0.3 - 1.2 mg/dL 5.0(H) 3.6(H) -  Alkaline Phos 38 - 126 U/L 307(H) 316(H) -  AST 15 - 41 U/L 86(H) 113(H) -  ALT 0 - 44 U/L 92(H) 100(H) -   Micro Results: No results found for this or any previous visit (from the past 240 hour(s)). Studies/Results: Mr 3d Recon At Scanner  Result Date: 05/27/2018 CLINICAL DATA:  Acute pancreatitis  EXAM: MRI ABDOMEN WITHOUT AND WITH CONTRAST (INCLUDING MRCP) TECHNIQUE: Multiplanar multisequence MR imaging of the abdomen was performed both before and after the administration of intravenous contrast. Heavily T2-weighted images of the biliary and pancreatic ducts were obtained, and three-dimensional MRCP images were rendered by post processing. CONTRAST:  10 cc Gadavist COMPARISON:  CT scan 05/22/2018 FINDINGS: Markedly motion degraded study. Lower chest: Moderate right and small left pleural effusions. Hepatobiliary: No suspicious focal abnormality within the liver parenchyma. Mild intrahepatic biliary duct dilatation evident the extrahepatic common duct measures up to 9 mm diameter. No evidence for stones within the common duct. 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 Gallbladder surgically absent. Pancreas: No dilatation of the main pancreatic duct. There is soft tissue fullness in the region of the pancreatic head, but no differential enhancement can be appreciated. No evidence for pancreatic pseudocyst. Spleen:  No splenomegaly. No focal mass lesion. Adrenals/Urinary Tract: No adrenal nodule or mass. 2.2 cm exophytic lesion posterior interpolar left kidney is compatible with a cyst. 1.2 cm cyst is identified in the upper pole of the left kidney. Stomach/Bowel: Stomach is unremarkable. No gastric wall  thickening. No evidence of outlet obstruction. Duodenum is normally positioned as is the ligament of Treitz. No small bowel or colonic dilatation within the visualized abdomen. Vascular/Lymphatic: No abdominal aortic aneurysm. There is no gastrohepatic or hepatoduodenal ligament lymphadenopathy. No intraperitoneal or retroperitoneal lymphadenopathy. Other: Diffuse edema is identified within the retroperitoneum, mesentery, and body wall. Musculoskeletal: No abnormal marrow enhancement within the visualized bony anatomy. IMPRESSION: 1. Mild intra and extrahepatic  biliary duct dilatation, similar to prior CT with abrupt stricturing of the duct in the head of pancreas. There is some soft tissue fullness in the head of pancreas but no discrete or infiltrative mass can be discerned and there is no associated dilatation of the main pancreatic duct. No findings for choledocholithiasis. ERCP/EUS may prove helpful to further evaluate. Follow-up imaging could be performed when patient is better able to cooperate with positioning and breath holding. 2. Moderate right and small left pleural effusion associated with diffuse edema in the abdomen and body wall. 3. Left renal cysts. Electronically Signed   By: Misty Stanley M.D.   On: 05/27/2018 17:31   Mr Abdomen Mrcp Moise Boring Contast  Result Date: 05/27/2018 CLINICAL DATA:  Acute pancreatitis EXAM: MRI ABDOMEN WITHOUT AND WITH CONTRAST (INCLUDING MRCP) TECHNIQUE: Multiplanar multisequence MR imaging of the abdomen was performed both before and after the administration of intravenous contrast. Heavily T2-weighted images of the biliary and pancreatic ducts were obtained, and three-dimensional MRCP images were rendered by post processing. CONTRAST:  10 cc Gadavist COMPARISON:  CT scan 05/22/2018 FINDINGS: Markedly motion degraded study. Lower chest: Moderate right and small left pleural effusions. Hepatobiliary: No suspicious focal abnormality within the liver parenchyma. Mild intrahepatic biliary duct dilatation evident the extrahepatic common duct measures up to 9 mm diameter. No evidence for stones within the common duct. 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 Gallbladder surgically absent. Pancreas: No dilatation of the main pancreatic duct. There is soft tissue fullness in the region of the pancreatic head, but no differential enhancement can be appreciated. No evidence for pancreatic pseudocyst. Spleen:  No splenomegaly. No focal mass lesion. Adrenals/Urinary Tract: No adrenal nodule or  mass. 2.2 cm exophytic lesion posterior interpolar left kidney is compatible with a cyst. 1.2 cm cyst is identified in the upper pole of the left kidney. Stomach/Bowel: Stomach is unremarkable. No gastric wall thickening. No evidence of outlet obstruction. Duodenum is normally positioned as is the ligament of Treitz. No small bowel or colonic dilatation within the visualized abdomen. Vascular/Lymphatic: No abdominal aortic aneurysm. There is no gastrohepatic or hepatoduodenal ligament lymphadenopathy. No intraperitoneal or retroperitoneal lymphadenopathy. Other: Diffuse edema is identified within the retroperitoneum, mesentery, and body wall. Musculoskeletal: No abnormal marrow enhancement within the visualized bony anatomy. IMPRESSION: 1. 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 There is some soft tissue fullness in the head of pancreas but no discrete or infiltrative mass can be discerned and there is no associated dilatation of the main pancreatic duct. No findings for choledocholithiasis. ERCP/EUS may prove helpful to further evaluate. Follow-up imaging could be performed when patient is better able to cooperate with positioning and breath holding. 2. Moderate right and small left pleural effusion associated with diffuse edema in the abdomen and body wall. 3. Left renal cysts. Electronically Signed   By: Misty Stanley M.D.   On: 05/27/2018 17:31   Medications:  I have reviewed  the patient's current medications. Prior to Admission:  Medications Prior to Admission  Medication Sig Dispense Refill Last Dose  . Antihemophilic Factor, Recomb, 2992 UNITS KIT Inject into the vein daily. Halixate or Kogenate 3 times weekly or every 12-24 hours as needed   unknown at unknown  . celecoxib (CELEBREX) 200 MG capsule Take 200 mg by mouth daily as needed.    prn at prn  . cephALEXin (KEFLEX) 500 MG capsule Take 1 capsule by  mouth 2 (two) times daily.   unknown at unknown  . DULoxetine (CYMBALTA) 60 MG capsule Take 60 mg by mouth daily.    unknown at unknown  . esomeprazole (NEXIUM) 40 MG capsule Take 40 mg by mouth daily.    unknown at unknown  . FLUoxetine (PROZAC) 20 MG capsule Take 20 mg by mouth daily.   unknown at unknown  . furosemide (LASIX) 20 MG tablet Take 20 mg by mouth daily.   unknown at unknown  . gabapentin (NEURONTIN) 300 MG capsule Take 300 mg by mouth 3 (three) times daily.    unknown at unknown  . glipiZIDE (GLUCOTROL XL) 10 MG 24 hr tablet Take 1 tablet by mouth daily.   unknown at unknown  . lisinopril (PRINIVIL,ZESTRIL) 10 MG tablet Take 1 tablet by mouth daily.   unknown at unknown  . NARCAN 4 MG/0.1ML LIQD nasal spray kit Place 1 spray into both nostrils every 5 (five) minutes as needed.   prn at prn  . oxyCODONE (OXYCONTIN) 10 mg 12 hr tablet Take 10 mg by mouth 2 (two) times daily as needed.    prn at prn  . oxyCODONE (OXYCONTIN) 40 mg 12 hr tablet Take 40 mg by mouth daily.   unknown at unknown  . pravastatin (PRAVACHOL) 40 MG tablet Take 1 tablet (40 mg total) by mouth daily. 30 tablet 3 unknown at unknown  . saxagliptin HCl (ONGLYZA) 5 MG TABS tablet Take 5 mg by mouth daily.    unknown at unknown  . tamsulosin (FLOMAX) 0.4 MG CAPS capsule Take 0.4 mg by mouth daily.    unknown at unknown  . CHANTIX 1 MG tablet Take 1 tablet by mouth 2 (two) times daily.  2 Taking   Scheduled: . DULoxetine  60 mg Oral Daily  . furosemide  20 mg Oral BID  . gabapentin  300 mg Oral QHS  . gabapentin  300 mg Oral TID  . insulin aspart  0-5 Units Subcutaneous QHS  . insulin aspart  0-9 Units Subcutaneous TID WC  . oxyCODONE  10 mg Oral Q12H  . pantoprazole  40 mg Oral Daily  . sodium chloride flush  3 mL Intravenous Q12H  . tamsulosin  0.4 mg Oral Daily  . varenicline  1 mg Oral BID   Continuous:  EQA:[id number] **OR** acetaminophen, morphine injection, ondansetron **OR** ondansetron (ZOFRAN)  IV, oxyCODONE Scheduled Meds: . DULoxetine  60 mg Oral Daily  . furosemide  20 mg Oral BID  . gabapentin  300 mg Oral QHS  . gabapentin  300 mg Oral TID  . insulin aspart  0-5 Units Subcutaneous QHS  . insulin aspart  0-9 Units Subcutaneous TID WC  . oxyCODONE  10 mg Oral Q12H  . pantoprazole  40 mg Oral Daily  . sodium chloride flush  3 mL Intravenous Q12H  . tamsulosin  0.4 mg Oral Daily  . varenicline  1 mg Oral BID   Continuous Infusions:  PRN Meds:.acetaminophen **OR** acetaminophen, morphine injection, ondansetron **OR** ondansetron (ZOFRAN)  IV, oxyCODONE   MRCP 05/27/2018 IMPRESSION: 1. 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 There is some soft tissue fullness in the head of pancreas but no discrete or infiltrative mass can be discerned and there is no associated dilatation of the main pancreatic duct. No findings for choledocholithiasis. ERCP/EUS may prove helpful to further evaluate. Follow-up imaging could be performed when patient is better able to cooperate with positioning and breath holding. 2. Moderate right and small left pleural effusion associated with diffuse edema in the abdomen and body wall. 3. Left renal cysts.   Assessment: Active Problems:   Acute pancreatitis  Obstructive jaundice secondary to CBD stricture Soft tissue fullness in the head of the pancreas  IgG4 mildly elevated 132.  Patient has features to suggest that he may have autoimmune pancreatitis or could be pancreatic malignancy Patient also has mild cirrhosis with chronic thrombocytopenia No other features of decompensation  Plan: Recommend ERCP for biliary decompression, discussed with Dr. Allen Norris, planned for tomorrow Monitor daily LFTs No need to trend serum lipase Check serum CEA N.p.o. past midnight Recommend EUS as outpatient to evaluate the soft tissue fullness at head of the pancreas, to  rule out malignancy Follow-up with Dr. Gustavo Lah as outpatient I have discussed alternative options, risks & benefits,  which include, but are not limited to, bleeding, infection, perforation,respiratory complication & drug reaction.  The patient agrees with this plan & written consent will be obtained.    Will follow along with you   LOS: 5 days   Jayna Mulnix 05/28/2018, 6:39 PM

## 2018-05-28 NOTE — Progress Notes (Signed)
Dr. Marius Ditch questioned regarding new IVF order LR@125 /hr and patient having 2+ edema and receiving Lasix BID; acknowledged; "...placed order for pancreatitis...but, can discontinue IVF order at this time".  Barbaraann Faster, RN; 8:03 PM 05/28/2018

## 2018-05-28 NOTE — Progress Notes (Signed)
Gastroenterology recommends ERCP, patient is kept n.p.o., continue gentle hydration, pain meds,.di  sCussed with nurse.

## 2018-05-28 NOTE — Care Management (Signed)
Barrier- gi consult. NPO for ERCP

## 2018-05-29 ENCOUNTER — Encounter
Admission: EM | Disposition: A | Discharge status: Home / Self Care | Payer: Self-pay | Source: Home / Self Care | Attending: Internal Medicine

## 2018-05-29 ENCOUNTER — Inpatient Hospital Stay: Payer: Medicare Other | Admitting: Anesthesiology

## 2018-05-29 ENCOUNTER — Inpatient Hospital Stay: Payer: Medicare Other

## 2018-05-29 ENCOUNTER — Encounter: Payer: Self-pay | Admitting: Anesthesiology

## 2018-05-29 ENCOUNTER — Other Ambulatory Visit: Payer: Self-pay

## 2018-05-29 DIAGNOSIS — K831 Obstruction of bile duct: Secondary | ICD-10-CM

## 2018-05-29 DIAGNOSIS — M359 Systemic involvement of connective tissue, unspecified: Secondary | ICD-10-CM

## 2018-05-29 HISTORY — PX: ERCP: SHX5425

## 2018-05-29 LAB — GLUCOSE, CAPILLARY
GLUCOSE-CAPILLARY: 90 mg/dL (ref 70–99)
Glucose-Capillary: 81 mg/dL (ref 70–99)
Glucose-Capillary: 96 mg/dL (ref 70–99)
Glucose-Capillary: 125 mg/dL — ABNORMAL HIGH (ref 70–99)
Glucose-Capillary: 156 mg/dL — ABNORMAL HIGH (ref 70–99)

## 2018-05-29 SURGERY — ERCP, WITH INTERVENTION IF INDICATED
Anesthesia: General

## 2018-05-29 MED ORDER — ENSURE MAX PROTEIN PO LIQD
11.0000 [oz_av] | Freq: Two times a day (BID) | ORAL | Status: DC
  Filled 2018-05-29: qty 330

## 2018-05-29 MED ORDER — HYDROMORPHONE HCL 1 MG/ML IJ SOLN
0.5000 mg | INTRAMUSCULAR | Status: DC | PRN
  Administered 2018-05-29 (×2): 0.5 mg via INTRAVENOUS

## 2018-05-29 MED ORDER — LIDOCAINE HCL (CARDIAC) PF 100 MG/5ML IV SOSY
PREFILLED_SYRINGE | INTRAVENOUS | Status: DC | PRN
  Administered 2018-05-29: 100 mg via INTRAVENOUS

## 2018-05-29 MED ORDER — INDOMETHACIN 50 MG RE SUPP
100.0000 mg | Freq: Once | RECTAL | Status: AC
  Administered 2018-05-29: 100 mg via RECTAL

## 2018-05-29 MED ORDER — HEPARIN SOD (PORK) LOCK FLUSH 100 UNIT/ML IV SOLN
500.0000 [IU] | INTRAVENOUS | Status: DC
  Administered 2018-05-29: 500 [IU]
  Filled 2018-05-29: qty 5

## 2018-05-29 MED ORDER — SUCCINYLCHOLINE CHLORIDE 20 MG/ML IJ SOLN
INTRAMUSCULAR | Status: DC | PRN
  Administered 2018-05-29: 100 mg via INTRAVENOUS

## 2018-05-29 MED ORDER — SODIUM CHLORIDE 0.9 % IV SOLN
INTRAVENOUS | Status: DC
  Administered 2018-05-29 (×3): via INTRAVENOUS

## 2018-05-29 MED ORDER — ONDANSETRON HCL 4 MG/2ML IJ SOLN
4.0000 mg | Freq: Once | INTRAMUSCULAR | Status: AC | PRN
  Administered 2018-05-29: 4 mg via INTRAVENOUS

## 2018-05-29 MED ORDER — FENTANYL CITRATE (PF) 100 MCG/2ML IJ SOLN
25.0000 ug | INTRAMUSCULAR | Status: AC | PRN
  Administered 2018-05-29 (×6): 25 ug via INTRAVENOUS

## 2018-05-29 MED ORDER — SODIUM CHLORIDE 0.9% FLUSH
10.0000 mL | INTRAVENOUS | Status: DC | PRN
  Administered 2018-05-29: 10 mL

## 2018-05-29 MED ORDER — LACTATED RINGERS IV SOLN
INTRAVENOUS | Status: DC | PRN
  Administered 2018-05-29: 11:00:00 via INTRAVENOUS

## 2018-05-29 MED ORDER — PROPOFOL 10 MG/ML IV BOLUS
INTRAVENOUS | Status: DC | PRN
  Administered 2018-05-29: 130 mg via INTRAVENOUS
  Administered 2018-05-29: 20 mg via INTRAVENOUS

## 2018-05-29 MED ORDER — PHENYLEPHRINE HCL 10 MG/ML IJ SOLN
INTRAMUSCULAR | Status: DC | PRN
  Administered 2018-05-29: 100 ug via INTRAVENOUS

## 2018-05-29 MED ORDER — HEPARIN SOD (PORK) LOCK FLUSH 100 UNIT/ML IV SOLN
500.0000 [IU] | INTRAVENOUS | Status: DC | PRN
  Filled 2018-05-29: qty 5

## 2018-05-29 MED ORDER — PROPOFOL 10 MG/ML IV BOLUS
INTRAVENOUS | Status: AC
  Filled 2018-05-29: qty 20

## 2018-05-29 MED ORDER — INDOMETHACIN 50 MG RE SUPP
RECTAL | Status: AC
  Filled 2018-05-29: qty 2

## 2018-05-29 MED ORDER — SODIUM CHLORIDE 0.9% FLUSH
10.0000 mL | Freq: Two times a day (BID) | INTRAVENOUS | Status: DC
  Administered 2018-05-29: 10 mL

## 2018-05-29 NOTE — Progress Notes (Signed)
[facility] NAME: Shawn Cardenas    MR#:  [id number]  DATE OF BIRTH:  April 12, 1956  Status post biliary stricture dilatation, stent placement.  Continue clear liquids today.  CHIEF COMPLAINT:   Chief Complaint  Patient presents with  . Chest Pain  . Abdominal Pain    REVIEW OF SYSTEMS:   ROS CONSTITUTIONAL: No fever, fatigue or weakness.  EYES: No blurred or double vision.  EARS, NOSE, AND THROAT: No tinnitus or ear pain.  RESPIRATORY: No cough, shortness of breath, wheezing or hemoptysis.  CARDIOVASCULAR: No chest pain, orthopnea, edema.  GASTROINTESTINAL: No abdominal pain or nausea today.  ENDOCRINE: No polyuria, nocturia,  HEMATOLOGY: No anemia, easy bruising or bleeding SKIN: No rash or lesion. MUSCULOSKELETAL: No joint pain or arthritis. Right bka  NEUROLOGIC: No tingling, numbness, weakness.  PSYCHIATRY: Anxiety, depression present  DRUG ALLERGIES:   Allergies  Allergen Reactions  . Aspirin Other (See Comments)    bleeding bleeding ASA contraindicated with bleeding disorder  . Other Other (See Comments)    NSAIDS contraindicated with bleeding disorder.    VITALS:  Blood pressure (!) 146/88, pulse 88, temperature 98.3 F (36.8 C), temperature source Oral, resp. rate 18, height 5\' 8"  (1.727 m), weight 104.3 kg, SpO2 99 %.  PHYSICAL EXAMINATION:  GENERAL:  63 y.o.-year-old patient lying in the bed with no acute distress.  EYES: Pupils equal, round, reactive to light and accommodation. No scleral icterus. Extraocular muscles intact.  HEENT: Head atraumatic, normocephalic. Oropharynx and nasopharynx clear.  NECK:  Supple, no jugular venous distention. No thyroid enlargement, no tenderness.  LUNGS: Normal breath sounds bilaterally, no wheezing, rales,rhonchi or crepitation. No use of accessory muscles of respiration.  Complains of left rib pains. CARDIOVASCULAR: S1, S2 normal. No murmurs, rubs, or gallops.   ABDOMEN: E no abdominal tenderness, bowel sounds present.   Right BKA NEUROLOGIC: Cranial nerves II through XII are intact. Muscle strength 5/5 in all extremities. Sensation intact. Gait not checked.  PSYCHIATRIC: The patient is alert and oriented x 3.  SKIN: No obvious rash, lesion, or ulcer.    LABORATORY PANEL:   CBC Recent Labs  Lab 05/24/18 0430  WBC 8.7  HGB 11.1*  HCT 36.0*  PLT 142*   ------------------------------------------------------------------------------------------------------------------  Chemistries  Recent Labs  Lab 05/26/18 0616 05/28/18 0432  NA 135  --   K 4.4  --   CL 106  --   CO2 23  --   GLUCOSE 84  --   BUN 26*  --   CREATININE 1.43*  --   CALCIUM 8.7*  --   AST 113* 86*  ALT 100* 92*  ALKPHOS 316* 307*  BILITOT 3.6* 5.0*   ------------------------------------------------------------------------------------------------------------------  Cardiac Enzymes Recent Labs  Lab 05/23/18 1141  TROPONINI 0.05*   ------------------------------------------------------------------------------------------------------------------  RADIOLOGY:  Mr 3d Recon At Scanner  Result Date: 05/27/2018 CLINICAL DATA:  Acute pancreatitis EXAM: MRI ABDOMEN WITHOUT AND WITH CONTRAST (INCLUDING MRCP) TECHNIQUE: Multiplanar multisequence MR imaging of the abdomen was performed both before and after the administration of intravenous contrast. Heavily T2-weighted images of the biliary and pancreatic ducts were obtained, and three-dimensional MRCP images were rendered by post processing. CONTRAST:  10 cc Gadavist COMPARISON:  CT scan 05/22/2018 FINDINGS: Markedly motion degraded study. Lower chest: Moderate right and small left pleural effusions. Hepatobiliary: No suspicious focal abnormality within the liver parenchyma. Mild intrahepatic biliary duct dilatation evident the extrahepatic common duct measures up to 9 mm diameter.  No evidence for stones within the common  duct. 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 Gallbladder surgically absent. Pancreas: No dilatation of the main pancreatic duct. There is soft tissue fullness in the region of the pancreatic head, but no differential enhancement can be appreciated. No evidence for pancreatic pseudocyst. Spleen:  No splenomegaly. No focal mass lesion. Adrenals/Urinary Tract: No adrenal nodule or mass. 2.2 cm exophytic lesion posterior interpolar left kidney is compatible with a cyst. 1.2 cm cyst is identified in the upper pole of the left kidney. Stomach/Bowel: Stomach is unremarkable. No gastric wall thickening. No evidence of outlet obstruction. Duodenum is normally positioned as is the ligament of Treitz. No small bowel or colonic dilatation within the visualized abdomen. Vascular/Lymphatic: No abdominal aortic aneurysm. There is no gastrohepatic or hepatoduodenal ligament lymphadenopathy. No intraperitoneal or retroperitoneal lymphadenopathy. Other: Diffuse edema is identified within the retroperitoneum, mesentery, and body wall. Musculoskeletal: No abnormal marrow enhancement within the visualized bony anatomy. IMPRESSION: 1. 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 There is some soft tissue fullness in the head of pancreas but no discrete or infiltrative mass can be discerned and there is no associated dilatation of the main pancreatic duct. No findings for choledocholithiasis. ERCP/EUS may prove helpful to further evaluate. Follow-up imaging could be performed when patient is better able to cooperate with positioning and breath holding. 2. Moderate right and small left pleural effusion associated with diffuse edema in the abdomen and body wall. 3. Left renal cysts. Electronically Signed   By: Misty Stanley M.D.   On: 05/27/2018 17:31   Dg C-arm 1-60 Min-no Report  Result Date:  05/29/2018 Fluoroscopy was utilized by the requesting physician.  No radiographic interpretation.   Mr Abdomen Mrcp W Wo Contast  Result Date: 05/27/2018 CLINICAL DATA:  Acute pancreatitis EXAM: MRI ABDOMEN WITHOUT AND WITH CONTRAST (INCLUDING MRCP) TECHNIQUE: Multiplanar multisequence MR imaging of the abdomen was performed both before and after the administration of intravenous contrast. Heavily T2-weighted images of the biliary and pancreatic ducts were obtained, and three-dimensional MRCP images were rendered by post processing. CONTRAST:  10 cc Gadavist COMPARISON:  CT scan 05/22/2018 FINDINGS: Markedly motion degraded study. Lower chest: Moderate right and small left pleural effusions. Hepatobiliary: No suspicious focal abnormality within the liver parenchyma. Mild intrahepatic biliary duct dilatation evident the extrahepatic common duct measures up to 9 mm diameter. No evidence for stones within the common duct. 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 Gallbladder surgically absent. Pancreas: No dilatation of the main pancreatic duct. There is soft tissue fullness in the region of the pancreatic head, but no differential enhancement can be appreciated. No evidence for pancreatic pseudocyst. Spleen:  No splenomegaly. No focal mass lesion. Adrenals/Urinary Tract: No adrenal nodule or mass. 2.2 cm exophytic lesion posterior interpolar left kidney is compatible with a cyst. 1.2 cm cyst is identified in the upper pole of the left kidney. Stomach/Bowel: Stomach is unremarkable. No gastric wall thickening. No evidence of outlet obstruction. Duodenum is normally positioned as is the ligament of Treitz. No small bowel or colonic dilatation within the visualized abdomen. Vascular/Lymphatic: No abdominal aortic aneurysm. There is no gastrohepatic or hepatoduodenal ligament lymphadenopathy. No intraperitoneal or retroperitoneal lymphadenopathy. Other: Diffuse edema is  identified within the retroperitoneum, mesentery, and body wall. Musculoskeletal: No abnormal marrow enhancement within  the visualized bony anatomy. IMPRESSION: 1. 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 There is some soft tissue fullness in the head of pancreas but no discrete or infiltrative mass can be discerned and there is no associated dilatation of the main pancreatic duct. No findings for choledocholithiasis. ERCP/EUS may prove helpful to further evaluate. Follow-up imaging could be performed when patient is better able to cooperate with positioning and breath holding. 2. Moderate right and small left pleural effusion associated with diffuse edema in the abdomen and body wall. 3. Left renal cysts. Electronically Signed   By: Misty Stanley M.D.   On: 05/27/2018 17:31    EKG:   Orders placed or performed during the hospital encounter of 05/23/18  . EKG 12-Lead  . EKG 12-Lead  . ED EKG  . ED EKG    ASSESSMENT AND PLAN:   #1. 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    2.  Diabetes mellitus type 2: Continue sliding scale insulin with coverage.  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 4.  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  5.  History of liver cirrhosis, hep C.  Hold statins as LFTs is elevated, #5  Acute on chronic renal failure with CKD stage III: Creatinine fluctuating between 1.4-1.5.  Encouraged p.o. intake, no IV fluids recommended because patient is often swollen and requesting Lasix.   Monitor closely, avoid nephrotoxic agents. . 6.  History of neuropathy: Continue Cymbalta, Neurontin.     #7 tobacco abuse, patient on Chantix.  Continue that. #8.  acute on chronic diastolic heart failure, ;continue Lasix.    #9 rib pain due to fall from wheelchair, likely bruise, rib x-ray negative for fractures.   Possible rib contusion, order incentive spirometry, continue pain medicines.  Right BKA.  Wheelchair-bound at baseline.  Watch for post ERCP pancreatitis, continue clear liquids, watch LFTs tomorrow, if okay with gastroenterology possible discharge tomorrow.   All the records are reviewed and case discussed with Care Management/Social Workerr. Management plans discussed with the patient, family and they are in agreement.  CODE STATUS: Full code  TOTAL TIME TAKING CARE OF THIS PATIENT: 40 minutes.   POSSIBLE D/C IN 1-2 DAYS, DEPENDING ON CLINICAL CONDITION.   More than 50% time spent in counseling, coordination of care  [name] M.D on 05/29/2018 at 12:18 PM  Between 7am to 6pm - Pager - [phone]  After 6pm go to [email or web address] - password [email or web address] [facility]  Office  [phone]  CC: Primary care physician; [name], MD   Note: This dictation was prepared with Dragon dictation along with smaller phrase technology. Any transcriptional errors that result from this process are unintentional.

## 2018-05-29 NOTE — Care Management (Signed)
ERCP- found biliary stricture in CBD.  Biliary sphincterotomy and stent placed

## 2018-05-29 NOTE — Anesthesia Procedure Notes (Signed)
Procedure Name: Intubation Date/Time: 05/29/2018 11:13 AM Performed by: Chanetta Marshall, CRNA Pre-anesthesia Checklist: Patient identified, Emergency Drugs available, Suction available and Patient being monitored Patient Re-evaluated:Patient Re-evaluated prior to induction Oxygen Delivery Method: Circle system utilized Preoxygenation: Pre-oxygenation with 100% oxygen Induction Type: IV induction Laryngoscope Size: Mac and 3 Grade View: Grade I Tube size: 7.5 mm Number of attempts: 1 Airway Equipment and Method: Stylet Placement Confirmation: ETT inserted through vocal cords under direct vision,  positive ETCO2,  CO2 detector and breath sounds checked- equal and bilateral Secured at: 22 cm Tube secured with: Tape Dental Injury: Teeth and Oropharynx as per pre-operative assessment

## 2018-05-29 NOTE — Anesthesia Preprocedure Evaluation (Signed)
Anesthesia Evaluation  Patient identified by MRN, date of birth, ID band Patient awake    Reviewed: Allergy & Precautions, NPO status , Patient's Chart, lab work & pertinent test results, reviewed documented beta blocker date and time   Airway Mallampati: III  TM Distance: >3 FB     Dental  (+) Chipped, Upper Dentures   Pulmonary Current Smoker,           Cardiovascular hypertension, Pt. on medications + CAD       Neuro/Psych PSYCHIATRIC DISORDERS Anxiety Depression  Neuromuscular disease    GI/Hepatic GERD  ,  Endo/Other  diabetes, Type 2  Renal/GU      Musculoskeletal  (+) Arthritis ,   Abdominal   Peds  Hematology   Anesthesia Other Findings Obese. Smokes. R BKA. Uses wheelchair. EKG shiows Rbbb. EF is 50-55.  Reproductive/Obstetrics                             Anesthesia Physical Anesthesia Plan  ASA: III  Anesthesia Plan: General   Post-op Pain Management:    Induction: Intravenous  PONV Risk Score and Plan:   Airway Management Planned: Oral ETT  Additional Equipment:   Intra-op Plan:   Post-operative Plan:   Informed Consent: I have reviewed the patients History and Physical, chart, labs and discussed the procedure including the risks, benefits and alternatives for the proposed anesthesia with the patient or authorized representative who has indicated his/her understanding and acceptance.       Plan Discussed with: CRNA  Anesthesia Plan Comments:         Anesthesia Quick Evaluation

## 2018-05-29 NOTE — Op Note (Addendum)
[facility] Gastroenterology Patient Name: Shawn Cardenas Procedure Date: 05/29/2018 11:16 AM MRN: [id number] Account #: [id number] Date of Birth: 07-25-1955 Admit Type: Outpatient Age: 63 Room: [facility] ENDO ROOM 4 Gender: Male Note Status: Finalized Procedure:            ERCP Indications:          Suspected acute pancreatitis, Autoimmune acute                        pancreatitis Providers:            [name] MD, MD Referring MD:         Ocie Cornfield. Ouida Sills MD, MD (Referring MD) Medicines:            General Anesthesia Complications:        No immediate complications. Procedure:            Pre-Anesthesia Assessment:                       - Prior to the procedure, a History and Physical was                        performed, and patient medications and allergies were                        reviewed. The patient's tolerance of previous                        anesthesia was also reviewed. The risks and benefits of                        the procedure and the sedation options and risks were                        discussed with the patient. All questions were                        answered, and informed consent was obtained. Prior                        Anticoagulants: The patient has taken no previous                        anticoagulant or antiplatelet agents. ASA Grade                        Assessment: III - A patient with severe systemic                        disease. After reviewing the risks and benefits, the                        patient was deemed in satisfactory condition to undergo                        the procedure.                       After obtaining informed consent, the scope was passed  under direct vision. Throughout the procedure, the                        patient's blood pressure, pulse, and oxygen saturations                        were monitored continuously. The Duodenoscope was                        introduced  through the mouth, and used to inject                        contrast into and used to inject contrast into the bile                        duct. The ERCP was accomplished without difficulty. The                        patient tolerated the procedure well. Findings:      A scout film of the abdomen was obtained. Surgical clips, consistent       with previous cholecystectomy, were seen in the area of the cystic duct.       The esophagus was successfully intubated under direct vision. The scope       was advanced to a normal major papilla in the descending duodenum       without detailed examination of the pharynx, larynx and associated       structures, and upper GI tract. The upper GI tract was grossly normal.       The bile duct was deeply cannulated with the short-nosed traction       sphincterotome. Contrast was injected. I personally interpreted the bile       duct images. There was brisk flow of contrast through the ducts. Image       quality was excellent. Contrast extended to the hepatic ducts. The lower       third of the main bile duct contained a single segmental stenosis 5 mm       in length. A wire was passed into the biliary tree. A 5 mm biliary       sphincterotomy was made with a traction (standard) sphincterotome using       ERBE electrocautery. There was no post-sphincterotomy bleeding. One 10       Fr by 7 cm plastic stent with a single external flap and a single       internal flap was placed 5 cm into the common bile duct. Bile flowed       through the stent. The stent was in good position. Cells for cytology       were obtained by brushing in the lower third of the main bile duct. Impression:           - A single segmental biliary stricture was found in the                        lower third of the main bile duct.                       - A biliary sphincterotomy was performed.                       -  One plastic stent was placed into the common bile                         duct.                       - Cells for cytology obtained in the lower third of the                        main duct. Recommendation:       - Return patient to hospital ward for ongoing care.                       - Clear liquid diet today. Procedure Code(s):    --- Professional ---                       [id number], Endoscopic retrograde cholangiopancreatography                        (ERCP); with placement of endoscopic stent into biliary                        or pancreatic duct, including pre- and post-dilation                        and guide wire passage, when performed, including                        sphincterotomy, when performed, each stent                       03009, Endoscopic catheterization of the biliary ductal                        system, radiological supervision and interpretation Diagnosis Code(s):    --- Professional ---                       K85.80, Other acute pancreatitis without necrosis or                        infection                       K83.1, Obstruction of bile duct CPT copyright 2018 American Medical Association. All rights reserved. The codes documented in this report are preliminary and upon coder review may  be revised to meet current compliance requirements. [name] MD, MD 05/29/2018 12:04:51 PM This report has been signed electronically. Number of Addenda: 0 Note Initiated On: 05/29/2018 11:16 AM      [facility]

## 2018-05-29 NOTE — Transfer of Care (Signed)
Immediate Anesthesia Transfer of Care Note  Patient: Shawn Cardenas  Procedure(s) Performed: ENDOSCOPIC RETROGRADE CHOLANGIOPANCREATOGRAPHY (ERCP) (N/A )  Patient Location: PACU  Anesthesia Type:General  Level of Consciousness: awake, alert  and oriented  Airway & Oxygen Therapy: Patient Spontanous Breathing and Patient connected to face mask oxygen  Post-op Assessment: Report given to RN and Post -op Vital signs reviewed and stable  Post vital signs: Reviewed and stable  Last Vitals:  Vitals Value Taken Time  BP    Temp    Pulse    Resp    SpO2      Last Pain:  Vitals:   05/29/18 1033  TempSrc: Oral  PainSc: 0-No pain      Patients Stated Pain Goal: 3 (88/28/00 3491)  Complications: No apparent anesthesia complications

## 2018-05-29 NOTE — Anesthesia Postprocedure Evaluation (Signed)
Anesthesia Post Note  Patient: Shawn Cardenas  Procedure(s) Performed: ENDOSCOPIC RETROGRADE CHOLANGIOPANCREATOGRAPHY (ERCP) (N/A )  Anesthesia Type: General Level of consciousness: awake and alert Pain management: pain level controlled Vital Signs Assessment: post-procedure vital signs reviewed and stable Respiratory status: spontaneous breathing, nonlabored ventilation, respiratory function stable and patient connected to nasal cannula oxygen Cardiovascular status: blood pressure returned to baseline and stable Postop Assessment: no apparent nausea or vomiting Anesthetic complications: no     Last Vitals:  Vitals:   05/29/18 1352 05/29/18 1415  BP: (!) 146/97 (!) 143/106  Pulse: 91 92  Resp: 14 14  Temp: (!) 36.1 C 36.6 C  SpO2: 96% 96%    Last Pain:  Vitals:   05/29/18 1415  TempSrc: Oral  PainSc: [place]

## 2018-05-29 NOTE — Anesthesia Post-op Follow-up Note (Signed)
Anesthesia QCDR form completed.        

## 2018-05-30 ENCOUNTER — Encounter: Payer: Self-pay | Admitting: Gastroenterology

## 2018-05-30 DIAGNOSIS — K838 Other specified diseases of biliary tract: Secondary | ICD-10-CM

## 2018-05-30 LAB — GLUCOSE, CAPILLARY
Glucose-Capillary: 109 mg/dL — ABNORMAL HIGH (ref 70–99)
Glucose-Capillary: 127 mg/dL — ABNORMAL HIGH (ref 70–99)
Glucose-Capillary: 92 mg/dL (ref 70–99)
Glucose-Capillary: 58 mg/dL — ABNORMAL LOW (ref 70–99)
Glucose-Capillary: 65 mg/dL — ABNORMAL LOW (ref 70–99)
Glucose-Capillary: 90 mg/dL (ref 70–99)

## 2018-05-30 LAB — HEPATIC FUNCTION PANEL
ALBUMIN: 3.2 g/dL — AB (ref 3.5–5.0)
ALT: 73 U/L — ABNORMAL HIGH (ref 0–44)
AST: 58 U/L — AB (ref 15–41)
Alkaline Phosphatase: 278 U/L — ABNORMAL HIGH (ref 38–126)
Bilirubin, Direct: 2.3 mg/dL — ABNORMAL HIGH (ref 0.0–0.2)
Indirect Bilirubin: 1.6 mg/dL — ABNORMAL HIGH (ref 0.3–0.9)
Total Bilirubin: 3.9 mg/dL — ABNORMAL HIGH (ref 0.3–1.2)
Total Protein: 7.4 g/dL (ref 6.5–8.1)

## 2018-05-30 LAB — CEA: CEA1: 3.6 ng/mL (ref 0.0–4.7)

## 2018-05-30 LAB — LIPASE, BLOOD: Lipase: 583 U/L — ABNORMAL HIGH (ref 11–51)

## 2018-05-30 MED ORDER — DEXTROSE-NACL 5-0.45 % IV SOLN
INTRAVENOUS | Status: AC
  Administered 2018-05-30: 22:00:00 via INTRAVENOUS

## 2018-05-30 MED ORDER — DEXTROSE 50 % IV SOLN
INTRAVENOUS | Status: AC
  Administered 2018-05-30: 50 mL
  Filled 2018-05-30: qty 50

## 2018-05-30 MED ORDER — HYDROMORPHONE HCL 1 MG/ML IJ SOLN
2.0000 mg | INTRAMUSCULAR | Status: DC | PRN
  Administered 2018-05-30 – 2018-05-31 (×5): 2 mg via INTRAVENOUS
  Filled 2018-05-30 (×5): qty 2

## 2018-05-30 MED ORDER — LACTATED RINGERS IV BOLUS
500.0000 mL | Freq: Once | INTRAVENOUS | Status: AC
  Administered 2018-05-30: 500 mL via INTRAVENOUS

## 2018-05-30 MED ORDER — DIPHENHYDRAMINE HCL 25 MG PO CAPS
25.0000 mg | ORAL_CAPSULE | Freq: Four times a day (QID) | ORAL | Status: DC | PRN
  Administered 2018-05-30: 25 mg via ORAL
  Filled 2018-05-30: qty 1

## 2018-05-30 NOTE — Progress Notes (Addendum)
Pt has not voided since beginning of the shift. .Bladder scan showed 592. Pt wants to attempt to urinate again before any doing any intervention.

## 2018-05-30 NOTE — Progress Notes (Signed)
CBG 58, 65 on repeat; 1/2 amp D50 given per hypoglycemic protocol; Dr. Jannifer Franklin notified of hypoglycemia and treatment; NPO; pancreatitis; acknowledged; new order for IVF written; will continue to monitor blood sugar. Barbaraann Faster, RN 9:55 PM 05/30/2018

## 2018-05-30 NOTE — Progress Notes (Signed)
[facility] NAME: Ruben Mahler    MR#:  [id number]  DATE OF BIRTH:  11-14-1955  Status post biliary stricture dilatation, stent placement.  More abdominal pain today, states that his pain is worse than admission.  Lipase is 583.  CHIEF COMPLAINT:   Chief Complaint  Patient presents with  . Chest Pain  . Abdominal Pain    REVIEW OF SYSTEMS:   ROS CONSTITUTIONAL: No fever, fatigue or weakness.  EYES: No blurred or double vision.  EARS, NOSE, AND THROAT: No tinnitus or ear pain.  RESPIRATORY: No cough, shortness of breath, wheezing or hemoptysis.  CARDIOVASCULAR: No chest pain, orthopnea, edema.  GASTROINTESTINAL: Abdominal pain in epigastric area hEMATOLOGY: No anemia, easy bruising or bleeding SKIN: No rash or lesion. MUSCULOSKELETAL: No joint pain or arthritis. Right bka  NEUROLOGIC: No tingling, numbness, weakness.  PSYCHIATRY: Anxiety, depression present  DRUG ALLERGIES:   Allergies  Allergen Reactions  . Aspirin Other (See Comments)    bleeding bleeding ASA contraindicated with bleeding disorder  . Other Other (See Comments)    NSAIDS contraindicated with bleeding disorder.    VITALS:  Blood pressure 126/85, pulse 91, temperature (!) 97.5 F (36.4 C), temperature source Oral, resp. rate 15, height 5\' 8"  (1.727 m), weight 104.3 kg, SpO2 97 %.  PHYSICAL EXAMINATION:  GENERAL:  63 y.o.-year-old patient lying in the bed with no acute distress.  EYES: Pupils equal, round, reactive to light and accommodation. No scleral icterus. Extraocular muscles intact.  HEENT: Head atraumatic, normocephalic. Oropharynx and nasopharynx clear.  NECK:  Supple, no jugular venous distention. No thyroid enlargement, no tenderness.  LUNGS: Normal breath sounds bilaterally, no wheezing, rales,rhonchi or crepitation. No use of accessory muscles of respiration.  Complains of left rib pains. CARDIOVASCULAR: S1, S2 normal. No murmurs,  rubs, or gallops.  ABDOMEN: E no abdominal tenderness, bowel sounds present.   Right BKA NEUROLOGIC: Cranial nerves II through XII are intact. Muscle strength 5/5 in all extremities. Sensation intact. Gait not checked.  PSYCHIATRIC: The patient is alert and oriented x 3.  SKIN: No obvious rash, lesion, or ulcer.    LABORATORY PANEL:   CBC Recent Labs  Lab 05/24/18 0430  WBC 8.7  HGB 11.1*  HCT 36.0*  PLT 142*   ------------------------------------------------------------------------------------------------------------------  Chemistries  Recent Labs  Lab 05/26/18 0616  05/30/18 0837  NA 135  --   --   K 4.4  --   --   CL 106  --   --   CO2 23  --   --   GLUCOSE 84  --   --   BUN 26*  --   --   CREATININE 1.43*  --   --   CALCIUM 8.7*  --   --   AST 113*   < > 58*  ALT 100*   < > 73*  ALKPHOS 316*   < > 278*  BILITOT 3.6*   < > 3.9*   < > = values in this interval not displayed.   ------------------------------------------------------------------------------------------------------------------  Cardiac Enzymes No results for input(s): TROPONINI in the last 168 hours. ------------------------------------------------------------------------------------------------------------------  RADIOLOGY:  Dg C-arm 1-60 Min-no Report  Result Date: 05/29/2018 Fluoroscopy was utilized by the requesting physician.  No radiographic interpretation.    EKG:   Orders placed or performed during the hospital encounter of 05/23/18  . EKG 12-Lead  . EKG 12-Lead  . ED EKG  . ED EKG  ASSESSMENT AND PLAN:   #1. acute pancreatitis'; continue to biliary stricture status post dilation, stent placement with ERCP t yesterday, patient had abdominal pain, pancreatitis again continue clear liquid, appreciate GI following.    2.  Diabetes mellitus type 2: Continue sliding scale insulin with coverage.  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 4.   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  5.  History of liver cirrhosis, hep C.  Hold statins as LFTs is elevated, #5  Acute on chronic renal failure with CKD stage III: Creatinine fluctuating between 1.4-1.5.  Encouraged p.o. intake, no IV fluids recommended because patient is often swollen and requesting Lasix.   Monitor closely, avoid nephrotoxic agents. . 6.  History of neuropathy: Continue Cymbalta, Neurontin.     #7 tobacco abuse, patient on Chantix.  Continue that. #8. acute on chronic diastolic heart failure, ;continue Lasix.    #9 rib pain due to fall from wheelchair, likely bruise, rib x-ray negative for fractures.   Possible rib contusion, order incentive spirometry, continue pain medicines.  Right BKA.  Wheelchair-bound at baseline.    All the records are reviewed and case discussed with Care Management/Social Workerr. Management plans discussed with the patient, family and they are in agreement.  CODE STATUS: Full code  TOTAL TIME TAKING CARE OF THIS PATIENT: 40 minutes.   POSSIBLE D/C IN 1-2 DAYS, DEPENDING ON CLINICAL CONDITION.   More than 50% time spent in counseling, coordination of care  [name] M.D on 05/30/2018 at 11:51 AM  Between 7am to 6pm - Pager - [phone]  After 6pm go to [email or web address] - password [email or web address] [facility]  Office  [phone]  CC: Primary care physician; [name], MD   Note: This dictation was prepared with Dragon dictation along with smaller phrase technology. Any transcriptional errors that result from this process are unintentional.

## 2018-05-30 NOTE — Progress Notes (Signed)
Cephas Darby, MD [address]  [place]  [place], [place] [address]  Main: [phone]  Fax: [phone] Pager: [phone]   Subjective: Patient developed worsening of epigastric pain after ERCP yesterday.  Has not been tolerating clear liquids.  He denies nausea or vomiting.  He denies fever, his vitals have been stable.  Objective: Vital signs in last 24 hours: Vitals:   05/29/18 1415 05/29/18 2031 05/30/18 0532 05/30/18 1338  BP: (!) 143/106 121/90 126/85 131/89  Pulse: 92 90 91 81  Resp: 14 (!) '22 15 14  '$ Temp: 97.8 F (36.6 C)  (!) 97.5 F (36.4 C) (!) 97.4 F (36.3 C)  TempSrc: Oral  Oral Oral  SpO2: 96% 99% 97% 95%  Weight:      Height:       Weight change:   Intake/Output Summary (Last 24 hours) at 05/30/2018 1615 Last data filed at 05/29/2018 1900 Gross per 24 hour  Intake 420 ml  Output -  Net 420 ml     Exam: Heart:: Regular rate and rhythm or S1S2 present Lungs: normal and clear to auscultation Abdomen: soft, nontender, normal bowel sounds   Lab Results: CBC Latest Ref Rng & Units 05/24/2018 05/23/2018 02/18/2018  WBC 4.0 - 10.5 K/uL 8.7 9.4 11.6(H)  Hemoglobin 13.0 - 17.0 g/dL 11.1(L) 12.1(L) 11.5(L)  Hematocrit 39.0 - 52.0 % 36.0(L) 38.9(L) 36.5(L)  Platelets 150 - 400 K/uL 142(L) 176 238   CMP Latest Ref Rng & Units 05/30/2018 05/28/2018 05/26/2018  Glucose 70 - 99 mg/dL - - 84  BUN 8 - 23 mg/dL - - 26(H)  Creatinine 0.61 - 1.24 mg/dL - - 1.43(H)  Sodium 135 - 145 mmol/L - - 135  Potassium 3.5 - 5.1 mmol/L - - 4.4  Chloride 98 - 111 mmol/L - - 106  CO2 22 - 32 mmol/L - - 23  Calcium 8.9 - 10.3 mg/dL - - 8.7(L)  Total Protein 6.5 - 8.1 g/dL 7.4 6.9 6.7  Total Bilirubin 0.3 - 1.2 mg/dL 3.9(H) 5.0(H) 3.6(H)  Alkaline Phos 38 - 126 U/L 278(H) 307(H) 316(H)  AST 15 - 41 U/L 58(H) 86(H) 113(H)  ALT 0 - 44 U/L 73(H) 92(H) 100(H)   Micro Results: No results found for this or any previous visit (from the past 240  hour(s)). Studies/Results: Dg C-arm 1-60 Min-no Report  Result Date: 05/29/2018 Fluoroscopy was utilized by the requesting physician.  No radiographic interpretation.   Medications:  I have reviewed the patient's current medications. Prior to Admission:  Medications Prior to Admission  Medication Sig Dispense Refill Last Dose  . Antihemophilic Factor, Recomb, 3716 UNITS KIT Inject into the vein daily. Halixate or Kogenate 3 times weekly or every 12-24 hours as needed   unknown at unknown  . celecoxib (CELEBREX) 200 MG capsule Take 200 mg by mouth daily as needed.    prn at prn  . cephALEXin (KEFLEX) 500 MG capsule Take 1 capsule by mouth 2 (two) times daily.   unknown at unknown  . DULoxetine (CYMBALTA) 60 MG capsule Take 60 mg by mouth daily.    unknown at unknown  . esomeprazole (NEXIUM) 40 MG capsule Take 40 mg by mouth daily.    unknown at unknown  . FLUoxetine (PROZAC) 20 MG capsule Take 20 mg by mouth daily.   unknown at unknown  . furosemide (LASIX) 20 MG tablet Take 20 mg by mouth daily.   unknown at unknown  . gabapentin (NEURONTIN) 300 MG capsule Take 300  mg by mouth 3 (three) times daily.    05/29/2018 at unknown  . glipiZIDE (GLUCOTROL XL) 10 MG 24 hr tablet Take 1 tablet by mouth daily.   unknown at unknown  . lisinopril (PRINIVIL,ZESTRIL) 10 MG tablet Take 1 tablet by mouth daily.   unknown at unknown  . NARCAN 4 MG/0.1ML LIQD nasal spray kit Place 1 spray into both nostrils every 5 (five) minutes as needed.   prn at prn  . oxyCODONE (OXYCONTIN) 10 mg 12 hr tablet Take 10 mg by mouth 2 (two) times daily as needed.    prn at prn  . oxyCODONE (OXYCONTIN) 40 mg 12 hr tablet Take 40 mg by mouth daily.   05/29/2018 at unknown  . pravastatin (PRAVACHOL) 40 MG tablet Take 1 tablet (40 mg total) by mouth daily. 30 tablet 3 unknown at unknown  . saxagliptin HCl (ONGLYZA) 5 MG TABS tablet Take 5 mg by mouth daily.    unknown at unknown  . tamsulosin (FLOMAX) 0.4 MG CAPS capsule Take 0.4 mg  by mouth daily.    unknown at unknown  . CHANTIX 1 MG tablet Take 1 tablet by mouth 2 (two) times daily.  2 Taking   Scheduled: . DULoxetine  60 mg Oral Daily  . furosemide  20 mg Oral BID  . gabapentin  300 mg Oral QHS  . gabapentin  300 mg Oral TID  . heparin lock flush  500 Units Intracatheter Q30 days  . insulin aspart  0-5 Units Subcutaneous QHS  . insulin aspart  0-9 Units Subcutaneous TID WC  . oxyCODONE  10 mg Oral Q12H  . pantoprazole  40 mg Oral Daily  . ENSURE MAX PROTEIN  11 oz Oral BID  . sodium chloride flush  3 mL Intravenous Q12H  . tamsulosin  0.4 mg Oral Daily  . varenicline  1 mg Oral BID   Continuous:  WLN:LGXQJJHERDEYC **OR** acetaminophen, heparin lock flush **AND** heparin lock flush, HYDROmorphone (DILAUDID) injection, ondansetron **OR** ondansetron (ZOFRAN) IV, oxyCODONE Anti-infectives (From admission, onward)   Start     Dose/Rate Route Frequency Ordered Stop   05/23/18 1430  cefTRIAXone (ROCEPHIN) 2 g in sodium chloride 0.9 % 100 mL IVPB     2 g 200 mL/hr over 30 Minutes Intravenous  Once 05/23/18 1421 05/23/18 1528     Scheduled Meds: . DULoxetine  60 mg Oral Daily  . furosemide  20 mg Oral BID  . gabapentin  300 mg Oral QHS  . gabapentin  300 mg Oral TID  . heparin lock flush  500 Units Intracatheter Q30 days  . insulin aspart  0-5 Units Subcutaneous QHS  . insulin aspart  0-9 Units Subcutaneous TID WC  . oxyCODONE  10 mg Oral Q12H  . pantoprazole  40 mg Oral Daily  . ENSURE MAX PROTEIN  11 oz Oral BID  . sodium chloride flush  3 mL Intravenous Q12H  . tamsulosin  0.4 mg Oral Daily  . varenicline  1 mg Oral BID   Continuous Infusions: PRN Meds:.acetaminophen **OR** acetaminophen, heparin lock flush **AND** heparin lock flush, HYDROmorphone (DILAUDID) injection, ondansetron **OR** ondansetron (ZOFRAN) IV, oxyCODONE   Assessment: Active Problems:   Acute pancreatitis   Connective tissue disorder (HCC)   Obstruction of bile duct  Status  post ERCP on 05/29/2018, CBD stricture status post biliary sphincterotomy and placement of plastic stent.  LFTs are improving CEA normal Worsening of epigastric pain after ERCP, however lipase is downtrending  Plan: N.p.o. for today Ringer  lactate 500 mL bolus Pain control Monitor LFTs daily Advance diet tomorrow  Recommend EUS as outpatient to evaluate the soft tissue fullness at head of the pancreas, to rule out malignancy Follow-up with Dr. Gustavo Lah as outpatient    LOS: 7 days   Mardi Cannady 05/30/2018, 4:15 PM

## 2018-05-30 NOTE — Progress Notes (Signed)
Dr. Jannifer Franklin notified of patient complaints of itching ("all day"); Benadryl ordered/given; repeat CBG 90; will continue to monitor. Barbaraann Faster, RN; 11:19 PM 05/30/2018

## 2018-05-30 NOTE — Progress Notes (Signed)
Order received to In and Out cath patient is bladder scan reads 400 mL or more.

## 2018-05-31 DIAGNOSIS — C7889 Secondary malignant neoplasm of other digestive organs: Secondary | ICD-10-CM

## 2018-05-31 LAB — COMPREHENSIVE METABOLIC PANEL
ALT: 59 U/L — AB (ref 0–44)
AST: 47 U/L — ABNORMAL HIGH (ref 15–41)
Albumin: 3.1 g/dL — ABNORMAL LOW (ref 3.5–5.0)
Alkaline Phosphatase: 237 U/L — ABNORMAL HIGH (ref 38–126)
Anion gap: 6 (ref 5–15)
BUN: 28 mg/dL — AB (ref 8–23)
CO2: 25 mmol/L (ref 22–32)
Calcium: 8.8 mg/dL — ABNORMAL LOW (ref 8.9–10.3)
Chloride: 102 mmol/L (ref 98–111)
Creatinine, Ser: 2.19 mg/dL — ABNORMAL HIGH (ref 0.61–1.24)
GFR calc Af Amer: 36 mL/min — ABNORMAL LOW (ref >60)
GFR calc non Af Amer: 31 mL/min — ABNORMAL LOW (ref >60)
GLUCOSE: 113 mg/dL — AB (ref 70–99)
Potassium: 4.3 mmol/L (ref 3.5–5.1)
Sodium: 133 mmol/L — ABNORMAL LOW (ref 135–145)
Total Bilirubin: 2.9 mg/dL — ABNORMAL HIGH (ref 0.3–1.2)
Total Protein: 6.9 g/dL (ref 6.5–8.1)

## 2018-05-31 LAB — MAGNESIUM: Magnesium: 1.6 mg/dL — ABNORMAL LOW (ref 1.7–2.4)

## 2018-05-31 LAB — GLUCOSE, CAPILLARY
Glucose-Capillary: 62 mg/dL — ABNORMAL LOW (ref 70–99)
Glucose-Capillary: 100 mg/dL — ABNORMAL HIGH (ref 70–99)
Glucose-Capillary: 94 mg/dL (ref 70–99)

## 2018-05-31 LAB — CYTOLOGY - NON PAP

## 2018-05-31 LAB — PHOSPHORUS: Phosphorus: 4.6 mg/dL (ref 2.5–4.6)

## 2018-05-31 MED ORDER — OXYCODONE HCL ER 40 MG PO T12A: 40.0000 mg | EXTENDED_RELEASE_TABLET | Freq: Every day | ORAL | 0 refills | Status: DC

## 2018-05-31 MED ORDER — OXYCODONE HCL 5 MG PO TABS
5.0000 mg | ORAL_TABLET | ORAL | Status: DC | PRN
  Administered 2018-05-31: 10 mg via ORAL
  Filled 2018-05-31: qty 2

## 2018-05-31 MED ORDER — DEXTROSE 50 % IV SOLN
12.5000 g | Freq: Once | INTRAVENOUS | Status: AC
  Administered 2018-05-31: 12.5 g via INTRAVENOUS

## 2018-05-31 MED ORDER — OXYCODONE HCL 5 MG PO TABS
5.0000 mg | ORAL_TABLET | ORAL | Status: DC | PRN
  Administered 2018-05-31: 10 mg via ORAL

## 2018-05-31 MED ORDER — DEXTROSE 50 % IV SOLN
INTRAVENOUS | Status: AC
  Filled 2018-05-31: qty 50

## 2018-05-31 MED ORDER — MAGNESIUM SULFATE IN D5W 1-5 GM/100ML-% IV SOLN
1.0000 g | Freq: Once | INTRAVENOUS | Status: AC
  Administered 2018-05-31: 1 g via INTRAVENOUS
  Filled 2018-05-31: qty 100

## 2018-05-31 MED ORDER — OXYCODONE HCL ER 10 MG PO T12A: 10.0000 mg | EXTENDED_RELEASE_TABLET | Freq: Two times a day (BID) | ORAL | 0 refills | Status: DC | PRN

## 2018-05-31 NOTE — Progress Notes (Addendum)
Initial Nutrition Assessment  DOCUMENTATION CODES:   Obesity unspecified  INTERVENTION:  - Ensure Max po BID, each supplement provides 150 kcal and 30 grams of protein  - Mild to moderate risk of refeeding; Recommend checking K, Mg, Phos daily.   - If pt is unable to tolerate PO diet, recommend post pyloric NG tube feeding  NUTRITION DIAGNOSIS:   Increased nutrient needs related to chronic illness(COPD, CKD 3, acute pancreatitis, CHF, cirrhosis) as evidenced by estimated needs.  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 Pt admit 05/23/18 d/t acute pancreatitis. Underwent ERCP 02/04, s/p biliary stricture dilation w/ stent placement.   Spoke with pt at bedside. Pt was alert and oriented.   Pt reports normal intake of only 1 meal a day, around 6pm. This meal is usually large and includes foods such as fried chicken or fried pork chops, mashed potatoes, greens, broccoli, beans, and corn. Pt does not like to eat fruit very often. He said he eats this meal at night because he is busy during the day. His wife cooks all the meals. Pt only snacks at night, mentioned that he drinks 8oz cans of sugar free strawberry water throughout the day.   PTA he felt fine; was able to use his motorized wheelchair w/o difficulties and had a normal appetite. He reports UBW 230# with no concerns of wt loss or gain. Per bed wt pt is 219#. NFPE revealed no depletion. Last recorded wt Nov 2019 was 235#, pt loss 16# (5%wt loss). Unable to determine if this is significant for the time frame.   Pt has been on and off NPO since admit, often experiencing pain after eating but no n/v. Willing to try to  drink Ensure Max. Recommend Ensure Max BID unless pt experiences pain. If pt is unable to tolerate PO diet, recommend post pyloric NG tube feeding.  Medications reviewed and include: Lasix 20mg  2x daily, insulin aspart 0-5 units @ bedtime, insulin aspart 0-9 units 3x daily, Protonix 40mg  Labs reviewed: ALP 278 (H), Lipase 583 (H), AST 58 (H), ALT 73 (H), Na 133 (L), K 4.3 (WNL), Phos 4.6 (WNL), Mg 1.6 (WNL)  NUTRITION - FOCUSED PHYSICAL EXAM:    Most Recent Value  Orbital Region  No depletion  Upper Arm Region  No depletion  Thoracic and Lumbar Region  No depletion  Buccal Region  No depletion  Temple Region  No depletion  Clavicle Bone Region  No depletion  Clavicle and Acromion Bone Region  No depletion  Scapular Bone Region  No depletion  Dorsal Hand  No depletion  Patellar Region  Unable to assess [Pt has rod in Left leg, unable to bend. Right leg amputated]  Anterior Thigh Region  Unable to assess  Posterior Calf Region  Unable to assess  Edema (RD Assessment)  Mild  Hair  Reviewed  Eyes  Reviewed  Mouth  Reviewed  Skin  Reviewed  Nails  Reviewed       Diet Order:   Diet Order            Diet heart healthy/carb modified Room service appropriate? Yes; Fluid consistency: Thin  Diet  effective now              EDUCATION NEEDS:   Education needs have been addressed  Skin:  Skin Assessment: Skin Integrity Issues: Skin Integrity Issues:: Other (Comment) Other: Abrasions all over, R Leg amputation below knee  Last BM:  02/05  Height:   Ht Readings from Last 1 Encounters:  05/23/18 5\' 8"  (1.727 m)    Weight:   Wt Readings from Last 1 Encounters:  05/31/18 99.5 kg    Ideal Body Weight:  64.4 kg using 8.0% amputation   BMI:  Body mass index is 33.35 kg/m.  Estimated Nutritional Needs:   Kcal:  2200-2500  Protein:  135-155 g  Fluid:  >/= 2.2L    [employer] Dietetic Intern

## 2018-05-31 NOTE — Discharge Summary (Signed)
Shawn Cardenas, is a 63 y.o. male  DOB 10-19-1955  MRN [id number].  Admission date:  05/23/2018  Admitting Physician  Gladstone Lighter, MD  Discharge Date:  06/01/2018   Primary MD  [name], MD  Recommendations for primary care physician for things to follow:  Discharge home, follow-up with PCP in 1 week Follow-up with Dr. Gustavo Lah as scheduled , patient may need endoscopic ultrasound to evaluate her pancreatic fullness as per GI recommendation from Dr. 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 Anxiety   . Arthritis   . Blood transfusion without reported diagnosis   . Clotting disorder ([place])   . Depression   . Diabetes mellitus without complication ([place])   . GERD (gastroesophageal reflux disease)   . Hypertension   . Neuromuscular disorder [facility])     Past Surgical History:  Procedure Laterality Date  . CHOLECYSTECTOMY    . ERCP N/A 05/29/2018   Procedure: ENDOSCOPIC RETROGRADE CHOLANGIOPANCREATOGRAPHY (ERCP);  Surgeon: [name], MD;  Location: [facility] ENDOSCOPY;  Service: Endoscopy;  Laterality: N/A;  . LEG AMPUTATION Right        History of present illness and  Hospital Course:     Kindly see H&P for history of present illness and admission details, please review complete Labs, Consult  reports and Test reports for all details in brief  HPI  from the history and physical done on the day of admission 63 year old male with history of hepatitis C, Liver cirrhosis, hemophilia, diabetes mellitus type 2 Chronic pain syndrome chronic abdominal pains comes in with worsening abdominal pain, nausea, admitted for pancreatitis.  Hospital Course  Acute abdominal pain epigastric area secondary to acute pancreatitis, initially thought it could be secondary to drug-induced pancreatitis with recent start of Onglyza for his diabetes.  So we discontinued Onglyza, lipase trended later down but went up again as we advance in the diet, seen by gastroenterology, had MRCP done MRCP is concerning for biliary stricture, patient had ERCp, stent placed, after that patient had significant abdominal pain the next day which improved with time, recommended n.p.o. status by gastroenterology, patient today is feeling much better, wants to eat regular food.  If he tolerates regular food he can be discharged home, patient is on chronic pain meds with OxyContin, oxycodone, given limited supply of both, patient need to see PCP regarding his chronic pelvic pain medication prescriptions. 2.  Fall from wheelchair and suffered left rib pains, x-ray of the left ribs negative. 3.  Hemophilia: Patient gets factor concentrate transfusion as needed. 4.  Diabetes mellitus type 2: Continue glipizide as needed. 5.  Chronic renal failure with CKD stage III, patient had acute on chronic renal failure with CKD stage III: Patient had creatinine up to 1.6 improved, patient takes Lasix as needed basis.  #6. right BKA status.   Discharge Condition: Stable   Follow UP  Follow-up Information    [name], MD. Schedule an appointment as soon as possible for a visit.   Specialty:  Family Medicine Why:  pt needs to call  PCP on monday and make appointment Contact information: [address] [place]  [address] [phone]             Discharge Instructions  and  Discharge Medications      Allergies as of 05/31/2018      Reactions   Aspirin Other (See Comments)   bleeding bleeding ASA contraindicated with bleeding disorder   Other Other (See Comments)   NSAIDS contraindicated with bleeding disorder.      Medication List    STOP taking these medications   celecoxib 200 MG capsule Commonly known as:  CELEBREX   glipiZIDE 10 MG 24 hr tablet Commonly known as:  GLUCOTROL XL Replaced by:  glipiZIDE 5 MG tablet   saxagliptin HCl 5 MG Tabs tablet Commonly known as:  ONGLYZA     TAKE these medications   Antihemophilic Factor (Recomb) 2774 units Kit Inject into the vein daily. Halixate or Kogenate 3 times weekly or every 12-24 hours as needed   cephALEXin 500 MG capsule Commonly known as:  KEFLEX Take 1 capsule by mouth 2 (two) times daily.   CHANTIX 1 MG tablet Generic drug:  varenicline Take 1 tablet by mouth 2 (two) times daily.   DULoxetine 60 MG capsule Commonly known as:  CYMBALTA Take 60 mg by mouth daily.   esomeprazole 40 MG capsule Commonly known as:  NEXIUM Take 40 mg by mouth daily.   FLUoxetine 20 MG capsule Commonly known as:  PROZAC Take 20 mg by mouth daily.   furosemide 20 MG tablet Commonly known as:  LASIX Take 1 tablet (20 mg total) by mouth 2 (two) times daily. What changed:  when to take this   gabapentin 300 MG capsule Commonly known as:  NEURONTIN Take 300 mg by mouth 3 (three) times daily.   glipiZIDE 5 MG tablet Commonly known as:  GLUCOTROL Take 1 tablet (5 mg total) by mouth daily before breakfast. Replaces:  glipiZIDE 10 MG 24 hr tablet   lisinopril 10 MG tablet Commonly known as:  PRINIVIL,ZESTRIL Take 1 tablet by mouth daily.   NARCAN 4 MG/0.1ML Liqd nasal spray kit Generic drug:  naloxone Place 1 spray into both nostrils every 5 (five) minutes as needed.   oxyCODONE 10 mg 12 hr tablet Commonly known as:   OXYCONTIN Take 1 tablet (10 mg total) by mouth 2 (two) times daily as needed.   oxyCODONE 40 mg 12 hr tablet Commonly known as:  OXYCONTIN Take 1 tablet (40 mg total) by mouth daily.   pravastatin 40 MG tablet Commonly known as:  PRAVACHOL Take 1 tablet (40 mg total) by mouth daily.   tamsulosin 0.4 MG Caps capsule Commonly known as:  FLOMAX Take 0.4 mg by mouth daily.         Diet and Activity recommendation: See Discharge Instructions above   Consults obtained -gastroenterology   Major procedures and Radiology Reports - PLEASE review detailed and final reports for all details, in brief -     Dg Chest 2 View  Result Date: 05/25/2018 CLINICAL DATA:  Left chest pain since this afternoon. [place] out of wheelchair this morning. EXAM: CHEST - 2 VIEW COMPARISON:  05/25/2018 FINDINGS: Cardiac enlargement with prominent pulmonary vascularity. Central interstitial pattern to the lungs likely interstitial edema. Small bilateral pleural effusions. Fluid in the fissures. No consolidation or pneumothorax. Mediastinal contours appear intact. Left central vascular line with tip over the cavoatrial junction region. Degenerative changes in both shoulders with degenerative fragmentation  versus postoperative change in the distal right clavicle and acromion. IMPRESSION: Cardiac enlargement with pulmonary vascular congestion and interstitial edema. Small bilateral pleural effusions. No focal consolidation. Electronically Signed   By: Lucienne Capers M.D.   On: 05/25/2018 22:28   Dg Ribs Unilateral Left  Result Date: 05/26/2018 CLINICAL DATA:  Pt states he fell out of his wheelchair yesterday. Left axillary rib pain since. Pt admitted 05/23/2018 for pancreatitis. Hx - HTN, neuromuscular disorder, DM, current smoker 2 ppd. EXAM: LEFT RIBS - 2 VIEW COMPARISON:  Current chest radiographs. Prior chest radiograph dated 02/18/2018. FINDINGS: No fracture or other bone lesions are seen involving the ribs.  IMPRESSION: Negative. Electronically Signed   By: Lajean Manes M.D.   On: 05/26/2018 14:28   Ct Abdomen Pelvis W Contrast  Result Date: 05/22/2018 CLINICAL DATA:  Epigastric pain for 2 months EXAM: CT ABDOMEN AND PELVIS WITH CONTRAST TECHNIQUE: Multidetector CT imaging of the abdomen and pelvis was performed using the standard protocol following bolus administration of intravenous contrast. CONTRAST:  129m OMNIPAQUE IOHEXOL 300 MG/ML  SOLN COMPARISON:  None. FINDINGS: Lower chest: Lung bases demonstrate bilateral pleural effusions right greater than left without focal infiltrate or sizable parenchymal nodule. Hepatobiliary: Liver is somewhat nodular in appearance consistent with underlying cirrhotic change. No focal mass lesion is seen. The gallbladder has been surgically removed. Very mild perihepatic fluid is noted. Pancreas: Pancreas is within normal limits. Spleen: Spleen is unremarkable with some mild perisplenic fluid. Adrenals/Urinary Tract: Adrenal glands are unremarkable. Kidneys are well visualized bilaterally. No renal calculi or obstructive changes are noted. The bladder is decompressed. Stomach/Bowel: Mild diverticular change of the colon is noted without evidence of diverticulitis. No obstructive changes are seen. The appendix is within normal limits. No small bowel abnormality or gastric abnormality is seen. Vascular/Lymphatic: Mild small mesenteric and retroperitoneal lymph nodes are seen likely reactive in nature. No sizable adenopathy is noted. Reproductive: Prostate is unremarkable. Other: Mild ascites is noted within the abdomen abdominal is surrounding the liver and spleen. Musculoskeletal: Mild degenerative changes of the lumbar spine are noted. IMPRESSION: Changes of cirrhosis with mild ascites and bilateral pleural effusions. Normal-appearing appendix. Mild lymph nodes within the mesentery and retroperitoneum likely reactive in nature. No sizable adenopathy is noted. These results will  be called to the ordering clinician or representative by the Radiologist Assistant, and communication documented in the PACS or zVision Dashboard. Electronically Signed   By: MInez CatalinaM.D.   On: 05/22/2018 13:05   Mr 3d Recon At Scanner  Result Date: 05/27/2018 CLINICAL DATA:  Acute pancreatitis EXAM: MRI ABDOMEN WITHOUT AND WITH CONTRAST (INCLUDING MRCP) TECHNIQUE: Multiplanar multisequence MR imaging of the abdomen was performed both before and after the administration of intravenous contrast. Heavily T2-weighted images of the biliary and pancreatic ducts were obtained, and three-dimensional MRCP images were rendered by post processing. CONTRAST:  10 cc Gadavist COMPARISON:  CT scan 05/22/2018 FINDINGS: Markedly motion degraded study. Lower chest: Moderate right and small left pleural effusions. Hepatobiliary: No suspicious focal abnormality within the liver parenchyma. Mild intrahepatic biliary duct dilatation evident the extrahepatic common duct measures up to 9 mm diameter. No evidence for stones within the common duct. 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 Gallbladder surgically absent. Pancreas: No dilatation of the main pancreatic duct. There is soft tissue fullness in the region of the pancreatic head, but no differential enhancement can be appreciated. No evidence for pancreatic pseudocyst. Spleen:  No  splenomegaly. No focal mass lesion. Adrenals/Urinary Tract: No adrenal nodule or mass. 2.2 cm exophytic lesion posterior interpolar left kidney is compatible with a cyst. 1.2 cm cyst is identified in the upper pole of the left kidney. Stomach/Bowel: Stomach is unremarkable. No gastric wall thickening. No evidence of outlet obstruction. Duodenum is normally positioned as is the ligament of Treitz. No small bowel or colonic dilatation within the visualized abdomen. Vascular/Lymphatic: No abdominal aortic aneurysm. There is no gastrohepatic or  hepatoduodenal ligament lymphadenopathy. No intraperitoneal or retroperitoneal lymphadenopathy. Other: Diffuse edema is identified within the retroperitoneum, mesentery, and body wall. Musculoskeletal: No abnormal marrow enhancement within the visualized bony anatomy. IMPRESSION: 1. 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 There is some soft tissue fullness in the head of pancreas but no discrete or infiltrative mass can be discerned and there is no associated dilatation of the main pancreatic duct. No findings for choledocholithiasis. ERCP/EUS may prove helpful to further evaluate. Follow-up imaging could be performed when patient is better able to cooperate with positioning and breath holding. 2. Moderate right and small left pleural effusion associated with diffuse edema in the abdomen and body wall. 3. Left renal cysts. Electronically Signed   By: Misty Stanley M.D.   On: 05/27/2018 17:31   Dg Chest Port 1 View  Result Date: 05/25/2018 CLINICAL DATA:  Shortness of breath EXAM: PORTABLE CHEST 1 VIEW COMPARISON:  02/18/2018 FINDINGS: Unchanged cardiomegaly distorted by leftward rotation. There is left-sided porta catheter with tip at the upper cavoatrial junction. Generalized interstitial coarsening. Layering pleural fluid is again seen on the right at least. No pneumothorax. IMPRESSION: Bronchitic and/or congestive interstitial coarsening with small layering pleural fluid on the right at least. Electronically Signed   By: Monte Fantasia M.D.   On: 05/25/2018 05:26   Dg C-arm 1-60 Min-no Report  Result Date: 05/29/2018 Fluoroscopy was utilized by the requesting physician.  No radiographic interpretation.   Mr Abdomen Mrcp W Wo Contast  Result Date: 05/27/2018 CLINICAL DATA:  Acute pancreatitis EXAM: MRI ABDOMEN WITHOUT AND WITH CONTRAST (INCLUDING MRCP) TECHNIQUE: Multiplanar multisequence MR imaging of the  abdomen was performed both before and after the administration of intravenous contrast. Heavily T2-weighted images of the biliary and pancreatic ducts were obtained, and three-dimensional MRCP images were rendered by post processing. CONTRAST:  10 cc Gadavist COMPARISON:  CT scan 05/22/2018 FINDINGS: Markedly motion degraded study. Lower chest: Moderate right and small left pleural effusions. Hepatobiliary: No suspicious focal abnormality within the liver parenchyma. Mild intrahepatic biliary duct dilatation evident the extrahepatic common duct measures up to 9 mm diameter. No evidence for stones within the common duct. 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 Gallbladder surgically absent. Pancreas: No dilatation of the main pancreatic duct. There is soft tissue fullness in the region of the pancreatic head, but no differential enhancement can be appreciated. No evidence for pancreatic pseudocyst. Spleen:  No splenomegaly. No focal mass lesion. Adrenals/Urinary Tract: No adrenal nodule or mass. 2.2 cm exophytic lesion posterior interpolar left kidney is compatible with a cyst. 1.2 cm cyst is identified in the upper pole of the left kidney. Stomach/Bowel: Stomach is unremarkable. No gastric wall thickening. No evidence of outlet obstruction. Duodenum is normally positioned as is the ligament of Treitz. No small bowel or colonic dilatation within the visualized abdomen. Vascular/Lymphatic: No abdominal aortic aneurysm. There is no gastrohepatic  or hepatoduodenal ligament lymphadenopathy. No intraperitoneal or retroperitoneal lymphadenopathy. Other: Diffuse edema is identified within the retroperitoneum, mesentery, and body wall. Musculoskeletal: No abnormal marrow enhancement within the visualized bony anatomy. IMPRESSION: 1. 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 There is some  soft tissue fullness in the head of pancreas but no discrete or infiltrative mass can be discerned and there is no associated dilatation of the main pancreatic duct. No findings for choledocholithiasis. ERCP/EUS may prove helpful to further evaluate. Follow-up imaging could be performed when patient is better able to cooperate with positioning and breath holding. 2. Moderate right and small left pleural effusion associated with diffuse edema in the abdomen and body wall. 3. Left renal cysts. Electronically Signed   By: Misty Stanley M.D.   On: 05/27/2018 17:31    Micro Results    No results found for this or any previous visit (from the past 240 hour(s)).     Today   Subjective:   Shawn Cardenas today has no headache,no chest abdominal pain,no new weakness tingling or numbness, feels much better wants to go home today.  Objective:   Blood pressure 122/73, pulse 83, temperature (!) 95.8 F (35.4 C), temperature source Axillary, resp. rate 16, height '5\' 8"'$  (1.727 m), weight 99.5 kg, SpO2 98 %.   Intake/Output Summary (Last 24 hours) at 06/01/2018 1450 Last data filed at 05/31/2018 1606 Gross per 24 hour  Intake 100.52 ml  Output 800 ml  Net -699.48 ml    Exam Awake Alert, Oriented x 3, No new F.N deficits, Normal affect .AT,PERRAL Supple Neck,No JVD, No cervical lymphadenopathy appriciated.  Symmetrical Chest wall movement, Good air movement bilaterally, CTAB RRR,No Gallops,Rubs or new Murmurs, No Parasternal Heave +ve B.Sounds, Abd Soft, Non tender, No organomegaly appriciated, No rebound -guarding or rigidity. No Cyanosis, Clubbing or edema, No new Rash or bruise  Data Review   CBC w Diff:  Lab Results  Component Value Date   WBC 8.7 05/24/2018   HGB 11.1 (L) 05/24/2018   HCT 36.0 (L) 05/24/2018   PLT 142 (L) 05/24/2018   LYMPHOPCT 15 02/25/2016   MONOPCT 4 02/25/2016   EOSPCT 1 02/25/2016   BASOPCT 0 02/25/2016    CMP:  Lab Results  Component Value Date   NA 133  (L) 05/31/2018   K 4.3 05/31/2018   CL 102 05/31/2018   CO2 25 05/31/2018   BUN 28 (H) 05/31/2018   CREATININE 2.19 (H) 05/31/2018   PROT 6.9 05/31/2018   ALBUMIN 3.1 (L) 05/31/2018   BILITOT 2.9 (H) 05/31/2018   ALKPHOS 237 (H) 05/31/2018   AST 47 (H) 05/31/2018   ALT 59 (H) 05/31/2018  .   Total Time in preparing paper work, data evaluation and todays exam - 34 minutes  [name] M.D on 06/01/2018 at 2:50 PM    Note: This dictation was prepared with Dragon dictation along with smaller phrase technology. Any transcriptional errors that result from this process are unintentional.

## 2018-05-31 NOTE — Care Management Important Message (Signed)
Patient aware of Medicare IM right, declined paper copy.

## 2018-05-31 NOTE — Progress Notes (Signed)
Shawn Darby, MD [address]  [place]  [place], [place] [address]  Main: [phone]  Fax: [phone] Pager: [phone]   Subjective: He has been tolerating modified carb diet and denies abdominal pain, nausea or vomiting.  Objective: Vital signs in last 24 hours: Vitals:   05/30/18 2016 05/31/18 0507 05/31/18 0946 05/31/18 1242  BP: 108/63 126/74  122/73  Pulse: 73 79  83  Resp: (!) 22 (!) 22  16  Temp: 97.7 F (36.5 C) 97.7 F (36.5 C)  (!) 95.8 F (35.4 C)  TempSrc:    Axillary  SpO2: 91% 97%  98%  Weight:   99.5 kg   Height:       Weight change:   Intake/Output Summary (Last 24 hours) at 05/31/2018 1800 Last data filed at 05/31/2018 1606 Gross per 24 hour  Intake 942.06 ml  Output 2650 ml  Net -1707.94 ml     Exam: Heart:: Regular rate and rhythm or S1S2 present Lungs: normal and clear to auscultation Abdomen: soft, nontender, normal bowel sounds   Lab Results: CBC Latest Ref Rng & Units 05/24/2018 05/23/2018 02/18/2018  WBC 4.0 - 10.5 K/uL 8.7 9.4 11.6(H)  Hemoglobin 13.0 - 17.0 g/dL 11.1(L) 12.1(L) 11.5(L)  Hematocrit 39.0 - 52.0 % 36.0(L) 38.9(L) 36.5(L)  Platelets 150 - 400 K/uL 142(L) 176 238   CMP Latest Ref Rng & Units 05/31/2018 05/30/2018 05/28/2018  Glucose 70 - 99 mg/dL 113(H) - -  BUN 8 - 23 mg/dL 28(H) - -  Creatinine 0.61 - 1.24 mg/dL 2.19(H) - -  Sodium 135 - 145 mmol/L 133(L) - -  Potassium 3.5 - 5.1 mmol/L 4.3 - -  Chloride 98 - 111 mmol/L 102 - -  CO2 22 - 32 mmol/L 25 - -  Calcium 8.9 - 10.3 mg/dL 8.8(L) - -  Total Protein 6.5 - 8.1 g/dL 6.9 7.4 6.9  Total Bilirubin 0.3 - 1.2 mg/dL 2.9(H) 3.9(H) 5.0(H)  Alkaline Phos 38 - 126 U/L 237(H) 278(H) 307(H)  AST 15 - 41 U/L 47(H) 58(H) 86(H)  ALT 0 - 44 U/L 59(H) 73(H) 92(H)   Micro Results: No results found for this or any previous visit (from the past 240 hour(s)). Studies/Results: No results found. Medications:  I have reviewed the patient's current medications. Prior  to Admission:  Medications Prior to Admission  Medication Sig Dispense Refill Last Dose  . Antihemophilic Factor, Recomb, 4709 UNITS KIT Inject into the vein daily. Halixate or Kogenate 3 times weekly or every 12-24 hours as needed   unknown at unknown  . celecoxib (CELEBREX) 200 MG capsule Take 200 mg by mouth daily as needed.    prn at prn  . cephALEXin (KEFLEX) 500 MG capsule Take 1 capsule by mouth 2 (two) times daily.   unknown at unknown  . DULoxetine (CYMBALTA) 60 MG capsule Take 60 mg by mouth daily.    unknown at unknown  . esomeprazole (NEXIUM) 40 MG capsule Take 40 mg by mouth daily.    unknown at unknown  . FLUoxetine (PROZAC) 20 MG capsule Take 20 mg by mouth daily.   unknown at unknown  . furosemide (LASIX) 20 MG tablet Take 20 mg by mouth daily.   unknown at unknown  . gabapentin (NEURONTIN) 300 MG capsule Take 300 mg by mouth 3 (three) times daily.    05/29/2018 at unknown  . glipiZIDE (GLUCOTROL XL) 10 MG 24 hr tablet Take 1 tablet by mouth daily.   unknown at unknown  .  lisinopril (PRINIVIL,ZESTRIL) 10 MG tablet Take 1 tablet by mouth daily.   unknown at unknown  . NARCAN 4 MG/0.1ML LIQD nasal spray kit Place 1 spray into both nostrils every 5 (five) minutes as needed.   prn at prn  . pravastatin (PRAVACHOL) 40 MG tablet Take 1 tablet (40 mg total) by mouth daily. 30 tablet 3 unknown at unknown  . saxagliptin HCl (ONGLYZA) 5 MG TABS tablet Take 5 mg by mouth daily.    unknown at unknown  . tamsulosin (FLOMAX) 0.4 MG CAPS capsule Take 0.4 mg by mouth daily.    unknown at unknown  . [DISCONTINUED] oxyCODONE (OXYCONTIN) 10 mg 12 hr tablet Take 10 mg by mouth 2 (two) times daily as needed.    prn at prn  . [DISCONTINUED] oxyCODONE (OXYCONTIN) 40 mg 12 hr tablet Take 40 mg by mouth daily.   05/29/2018 at unknown  . CHANTIX 1 MG tablet Take 1 tablet by mouth 2 (two) times daily.  2 Taking   Scheduled: . dextrose      . DULoxetine  60 mg Oral Daily  . furosemide  20 mg Oral BID  .  gabapentin  300 mg Oral QHS  . gabapentin  300 mg Oral TID  . heparin lock flush  500 Units Intracatheter Q30 days  . insulin aspart  0-5 Units Subcutaneous QHS  . insulin aspart  0-9 Units Subcutaneous TID WC  . oxyCODONE  10 mg Oral Q12H  . pantoprazole  40 mg Oral Daily  . ENSURE MAX PROTEIN  11 oz Oral BID  . sodium chloride flush  3 mL Intravenous Q12H  . tamsulosin  0.4 mg Oral Daily  . varenicline  1 mg Oral BID   Continuous:  CLE:[id number] **OR** acetaminophen, diphenhydrAMINE, heparin lock flush **AND** heparin lock flush, HYDROmorphone (DILAUDID) injection, ondansetron **OR** ondansetron (ZOFRAN) IV, oxyCODONE Anti-infectives (From admission, onward)   Start     Dose/Rate Route Frequency Ordered Stop   05/23/18 1430  cefTRIAXone (ROCEPHIN) 2 g in sodium chloride 0.9 % 100 mL IVPB     2 g 200 mL/hr over 30 Minutes Intravenous  Once 05/23/18 1421 05/23/18 1528     Scheduled Meds: . dextrose      . DULoxetine  60 mg Oral Daily  . furosemide  20 mg Oral BID  . gabapentin  300 mg Oral QHS  . gabapentin  300 mg Oral TID  . heparin lock flush  500 Units Intracatheter Q30 days  . insulin aspart  0-5 Units Subcutaneous QHS  . insulin aspart  0-9 Units Subcutaneous TID WC  . oxyCODONE  10 mg Oral Q12H  . pantoprazole  40 mg Oral Daily  . ENSURE MAX PROTEIN  11 oz Oral BID  . sodium chloride flush  3 mL Intravenous Q12H  . tamsulosin  0.4 mg Oral Daily  . 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  LFTs are improving CEA normal Bile duct  brushings concerning for malignancy  Plan: Tolerating diet well Recommend follow-up with oncology as outpatient Follow-up with Dr. Gustavo Lah as outpatient  Okay to discharge home from GI standpoint  LOS: 8 days   Junko Ohagan 05/31/2018, 6:00 PM

## 2018-05-31 NOTE — Progress Notes (Addendum)
Hypoglycemic Event  CBG: 62  Treatment: 12.5mg  IV dextrose  Symptoms: none  Follow-up CBG: Time: 0947 CBG Result:100  Possible Reasons for Event: pt was npo Comments/MD notified: MD aware    Shawn Cardenas

## 2018-05-31 NOTE — Progress Notes (Signed)
Shawn Cardenas to be D/C'd  per MD order.  Discussed prescriptions and follow up appointments with the patient. Prescriptions given to patient, medication list explained in detail. Pt verbalized understanding.  Allergies as of 05/31/2018      Reactions   Aspirin Other (See Comments)   bleeding bleeding ASA contraindicated with bleeding disorder   Other Other (See Comments)   NSAIDS contraindicated with bleeding disorder.      Medication List    STOP taking these medications   celecoxib 200 MG capsule Commonly known as:  CELEBREX   glipiZIDE 10 MG 24 hr tablet Commonly known as:  GLUCOTROL XL Replaced by:  glipiZIDE 5 MG tablet   saxagliptin HCl 5 MG Tabs tablet Commonly known as:  ONGLYZA     TAKE these medications   Antihemophilic Factor (Recomb) 2263 units Kit Inject into the vein daily. Halixate or Kogenate 3 times weekly or every 12-24 hours as needed   cephALEXin 500 MG capsule Commonly known as:  KEFLEX Take 1 capsule by mouth 2 (two) times daily.   CHANTIX 1 MG tablet Generic drug:  varenicline Take 1 tablet by mouth 2 (two) times daily.   DULoxetine 60 MG capsule Commonly known as:  CYMBALTA Take 60 mg by mouth daily.   esomeprazole 40 MG capsule Commonly known as:  NEXIUM Take 40 mg by mouth daily.   FLUoxetine 20 MG capsule Commonly known as:  PROZAC Take 20 mg by mouth daily.   furosemide 20 MG tablet Commonly known as:  LASIX Take 1 tablet (20 mg total) by mouth 2 (two) times daily. What changed:  when to take this   gabapentin 300 MG capsule Commonly known as:  NEURONTIN Take 300 mg by mouth 3 (three) times daily.   glipiZIDE 5 MG tablet Commonly known as:  GLUCOTROL Take 1 tablet (5 mg total) by mouth daily before breakfast. Replaces:  glipiZIDE 10 MG 24 hr tablet   lisinopril 10 MG tablet Commonly known as:  PRINIVIL,ZESTRIL Take 1 tablet by mouth daily.   NARCAN 4 MG/0.1ML Liqd nasal spray kit Generic drug:  naloxone Place 1 spray into  both nostrils every 5 (five) minutes as needed.   oxyCODONE 10 mg 12 hr tablet Commonly known as:  OXYCONTIN Take 1 tablet (10 mg total) by mouth 2 (two) times daily as needed.   oxyCODONE 40 mg 12 hr tablet Commonly known as:  OXYCONTIN Take 1 tablet (40 mg total) by mouth daily.   pravastatin 40 MG tablet Commonly known as:  PRAVACHOL Take 1 tablet (40 mg total) by mouth daily.   tamsulosin 0.4 MG Caps capsule Commonly known as:  FLOMAX Take 0.4 mg by mouth daily.       Vitals:   05/31/18 0507 05/31/18 1242  BP: 126/74 122/73  Pulse: 79 83  Resp: (!) 22 16  Temp: 97.7 F (36.5 C) (!) 95.8 F (35.4 C)  SpO2: 97% 98%    Skin clean, dry and intact without evidence of skin break down, no evidence of skin tears noted. IV catheter discontinued intact. Site without signs and symptoms of complications. Dressing and pressure applied. Pt denies pain at this time. No complaints noted.  An After Visit Summary was printed and given to the patient. Patient escorted via [place], and D/C home via private auto.  Shawn Cardenas\

## 2018-06-01 ENCOUNTER — Telehealth: Payer: Self-pay

## 2018-06-01 ENCOUNTER — Other Ambulatory Visit: Payer: Self-pay

## 2018-06-01 NOTE — Telephone Encounter (Signed)
Voicemail left with spouse, Shawn Cardenas to return call regarding arranging EUS. Per GI he will need EUS for biopsy. He has follow up with Dr. Gustavo Lah on February 17th. We can have EUS performed at [facility] February 20th.

## 2018-06-01 NOTE — Telephone Encounter (Signed)
Mr. Manes returned call. I spoke with him and his spouse, [name]. Provided results from ERCP cytology, suspicious for malignancy. Educated on EUS. We went over all instructions below and all questions were answered. He will hold his glipizide the morning of EUS. He is not on any anticoagulants.Copy of instructions along with my contact information were mailed to his home address. Encouraged to call if he has any further questions. Reiterated to keep appointment with Dr. Gustavo Lah 2/17 for follow up.  INSTRUCTIONS FOR ENDOSCOPIC ULTRASOUND -Your procedure has been scheduled for February 20th with Dr. Mont Dutton at [facility]. -The hospital will contact you to pre-register over the phone.  -To get your scheduled arrival time, please call the Endoscopy unit at  [phone] between 1-3 p.m. on:  February 19th   -ON THE DAY OF YOU PROCEDURE:   1. If you are scheduled for a morning procedure, nothing to drink after midnight  -If you are scheduled for an afternoon procedure, you may have clear liquids until 5 hours prior  to the procedure but no carbonated drinks or broth  2. NO FOOD THE DAY OF YOUR PROCEDURE  3. You may take your heart, seizure, blood pressure, Parkinson's or breathing medications at  6am with just enough water to get your pills down  4. Do not take any oral Diabetic medications the morning of your procedure.  5. If you are a diabetic and are using insulin, please notify your prescribing physician of this  procedure as your dose may need to be altered related to not being able to eat or drink.   5. Do not take vitamins or fish oil for 5 days before your procedure     -On the day of your procedure, come to the [facility] Admitting/Registration desk (First desk on the right) at the scheduled arrival time. You MUST have someone drive you home from your procedure. You must have a responsible adult with a valid driver's license who is on site throughout your entire  procedure and who can stay with you for several hours after your procedure. You may not go home alone in a taxi, shuttle [place] or bus, as the drivers will not be responsible for you.  --If you have any questions please call me at the above contact

## 2018-06-03 DIAGNOSIS — K7460 Unspecified cirrhosis of liver: Secondary | ICD-10-CM | POA: Diagnosis present

## 2018-06-03 DIAGNOSIS — R338 Other retention of urine: Secondary | ICD-10-CM | POA: Diagnosis present

## 2018-06-03 DIAGNOSIS — Z8249 Family history of ischemic heart disease and other diseases of the circulatory system: Secondary | ICD-10-CM

## 2018-06-03 DIAGNOSIS — Z79891 Long term (current) use of opiate analgesic: Secondary | ICD-10-CM

## 2018-06-03 DIAGNOSIS — K851 Biliary acute pancreatitis without necrosis or infection: Principal | ICD-10-CM | POA: Diagnosis present

## 2018-06-03 DIAGNOSIS — E119 Type 2 diabetes mellitus without complications: Secondary | ICD-10-CM | POA: Diagnosis present

## 2018-06-03 DIAGNOSIS — F419 Anxiety disorder, unspecified: Secondary | ICD-10-CM | POA: Diagnosis present

## 2018-06-03 DIAGNOSIS — E785 Hyperlipidemia, unspecified: Secondary | ICD-10-CM | POA: Diagnosis present

## 2018-06-03 DIAGNOSIS — Z886 Allergy status to analgesic agent status: Secondary | ICD-10-CM

## 2018-06-03 DIAGNOSIS — D66 Hereditary factor VIII deficiency: Secondary | ICD-10-CM | POA: Diagnosis present

## 2018-06-03 DIAGNOSIS — K831 Obstruction of bile duct: Secondary | ICD-10-CM | POA: Diagnosis present

## 2018-06-03 DIAGNOSIS — Z7984 Long term (current) use of oral hypoglycemic drugs: Secondary | ICD-10-CM

## 2018-06-03 DIAGNOSIS — N401 Enlarged prostate with lower urinary tract symptoms: Secondary | ICD-10-CM | POA: Diagnosis present

## 2018-06-03 DIAGNOSIS — I10 Essential (primary) hypertension: Secondary | ICD-10-CM | POA: Diagnosis present

## 2018-06-03 DIAGNOSIS — F17210 Nicotine dependence, cigarettes, uncomplicated: Secondary | ICD-10-CM | POA: Diagnosis present

## 2018-06-03 DIAGNOSIS — K219 Gastro-esophageal reflux disease without esophagitis: Secondary | ICD-10-CM | POA: Diagnosis present

## 2018-06-03 DIAGNOSIS — Z89611 Acquired absence of right leg above knee: Secondary | ICD-10-CM

## 2018-06-03 DIAGNOSIS — F329 Major depressive disorder, single episode, unspecified: Secondary | ICD-10-CM | POA: Diagnosis present

## 2018-06-03 DIAGNOSIS — Z79899 Other long term (current) drug therapy: Secondary | ICD-10-CM

## 2018-06-03 DIAGNOSIS — D689 Coagulation defect, unspecified: Secondary | ICD-10-CM | POA: Diagnosis present

## 2018-06-03 DIAGNOSIS — M1990 Unspecified osteoarthritis, unspecified site: Secondary | ICD-10-CM | POA: Diagnosis present

## 2018-06-04 ENCOUNTER — Inpatient Hospital Stay: Payer: Medicare Other

## 2018-06-04 ENCOUNTER — Inpatient Hospital Stay: Payer: Medicare Other | Admitting: Anesthesiology

## 2018-06-04 ENCOUNTER — Emergency Department: Payer: Medicare Other

## 2018-06-04 ENCOUNTER — Encounter
Admission: EM | Disposition: A | Discharge status: Home / Self Care | Payer: Self-pay | Source: Home / Self Care | Attending: Internal Medicine

## 2018-06-04 ENCOUNTER — Other Ambulatory Visit: Payer: Self-pay

## 2018-06-04 ENCOUNTER — Telehealth: Payer: Self-pay

## 2018-06-04 ENCOUNTER — Inpatient Hospital Stay
Admission: EM | Admit: 2018-06-04 | Discharge: 2018-06-06 | DRG: 408 | Disposition: A | Discharge status: Home / Self Care | Payer: Medicare Other | Attending: Internal Medicine | Admitting: Internal Medicine

## 2018-06-04 DIAGNOSIS — K7460 Unspecified cirrhosis of liver: Secondary | ICD-10-CM | POA: Diagnosis present

## 2018-06-04 DIAGNOSIS — N401 Enlarged prostate with lower urinary tract symptoms: Secondary | ICD-10-CM | POA: Diagnosis present

## 2018-06-04 DIAGNOSIS — K851 Biliary acute pancreatitis without necrosis or infection: Secondary | ICD-10-CM | POA: Diagnosis present

## 2018-06-04 DIAGNOSIS — Z89611 Acquired absence of right leg above knee: Secondary | ICD-10-CM | POA: Diagnosis not present

## 2018-06-04 DIAGNOSIS — Z886 Allergy status to analgesic agent status: Secondary | ICD-10-CM | POA: Diagnosis not present

## 2018-06-04 DIAGNOSIS — R17 Unspecified jaundice: Secondary | ICD-10-CM

## 2018-06-04 DIAGNOSIS — Z79899 Other long term (current) drug therapy: Secondary | ICD-10-CM | POA: Diagnosis not present

## 2018-06-04 DIAGNOSIS — Z8249 Family history of ischemic heart disease and other diseases of the circulatory system: Secondary | ICD-10-CM | POA: Diagnosis not present

## 2018-06-04 DIAGNOSIS — Z7984 Long term (current) use of oral hypoglycemic drugs: Secondary | ICD-10-CM | POA: Diagnosis not present

## 2018-06-04 DIAGNOSIS — D66 Hereditary factor VIII deficiency: Secondary | ICD-10-CM | POA: Diagnosis present

## 2018-06-04 DIAGNOSIS — K831 Obstruction of bile duct: Secondary | ICD-10-CM | POA: Diagnosis present

## 2018-06-04 DIAGNOSIS — Z79891 Long term (current) use of opiate analgesic: Secondary | ICD-10-CM | POA: Diagnosis not present

## 2018-06-04 DIAGNOSIS — I10 Essential (primary) hypertension: Secondary | ICD-10-CM | POA: Diagnosis present

## 2018-06-04 DIAGNOSIS — R338 Other retention of urine: Secondary | ICD-10-CM | POA: Diagnosis present

## 2018-06-04 DIAGNOSIS — E119 Type 2 diabetes mellitus without complications: Secondary | ICD-10-CM | POA: Diagnosis present

## 2018-06-04 DIAGNOSIS — M1990 Unspecified osteoarthritis, unspecified site: Secondary | ICD-10-CM | POA: Diagnosis present

## 2018-06-04 DIAGNOSIS — D689 Coagulation defect, unspecified: Secondary | ICD-10-CM | POA: Diagnosis present

## 2018-06-04 DIAGNOSIS — K219 Gastro-esophageal reflux disease without esophagitis: Secondary | ICD-10-CM | POA: Diagnosis present

## 2018-06-04 DIAGNOSIS — E785 Hyperlipidemia, unspecified: Secondary | ICD-10-CM | POA: Diagnosis present

## 2018-06-04 DIAGNOSIS — F419 Anxiety disorder, unspecified: Secondary | ICD-10-CM | POA: Diagnosis present

## 2018-06-04 DIAGNOSIS — F17210 Nicotine dependence, cigarettes, uncomplicated: Secondary | ICD-10-CM | POA: Diagnosis present

## 2018-06-04 DIAGNOSIS — F329 Major depressive disorder, single episode, unspecified: Secondary | ICD-10-CM | POA: Diagnosis present

## 2018-06-04 HISTORY — PX: ERCP: SHX5425

## 2018-06-04 LAB — COMPREHENSIVE METABOLIC PANEL
ALK PHOS: 359 U/L — AB (ref 38–126)
ALT: 80 U/L — AB (ref 0–44)
AST: 93 U/L — ABNORMAL HIGH (ref 15–41)
Albumin: 3.1 g/dL — ABNORMAL LOW (ref 3.5–5.0)
Anion gap: 7 (ref 5–15)
BUN: 21 mg/dL (ref 8–23)
CALCIUM: 8.9 mg/dL (ref 8.9–10.3)
CO2: 27 mmol/L (ref 22–32)
Chloride: 100 mmol/L (ref 98–111)
Creatinine, Ser: 1.38 mg/dL — ABNORMAL HIGH (ref 0.61–1.24)
GFR calc Af Amer: >60 mL/min (ref >60)
GFR calc non Af Amer: 54 mL/min — ABNORMAL LOW (ref >60)
Glucose, Bld: 160 mg/dL — ABNORMAL HIGH (ref 70–99)
Potassium: 4.1 mmol/L (ref 3.5–5.1)
Sodium: 134 mmol/L — ABNORMAL LOW (ref 135–145)
TOTAL PROTEIN: 6.8 g/dL (ref 6.5–8.1)
Total Bilirubin: 8.6 mg/dL — ABNORMAL HIGH (ref 0.3–1.2)

## 2018-06-04 LAB — TROPONIN I
Troponin I: 0.03 ng/mL (ref <0.03)
Troponin I: 0.04 ng/mL (ref <0.03)
Troponin I: <0.03 ng/mL (ref <0.03)

## 2018-06-04 LAB — URINALYSIS, COMPLETE (UACMP) WITH MICROSCOPIC
Bacteria, UA: NONE SEEN
Bilirubin Urine: NEGATIVE
Glucose, UA: NEGATIVE mg/dL
Ketones, ur: NEGATIVE mg/dL
Leukocytes, UA: NEGATIVE
Nitrite: NEGATIVE
Protein, ur: 100 mg/dL — AB
Specific Gravity, Urine: 1.004 — ABNORMAL LOW (ref 1.005–1.030)
Squamous Epithelial / HPF: NONE SEEN (ref 0–5)
pH: 6 (ref 5.0–8.0)

## 2018-06-04 LAB — CBC
HCT: 36.5 % — ABNORMAL LOW (ref 39.0–52.0)
Hemoglobin: 11.9 g/dL — ABNORMAL LOW (ref 13.0–17.0)
MCH: 25.5 pg — ABNORMAL LOW (ref 26.0–34.0)
MCHC: 32.6 g/dL (ref 30.0–36.0)
MCV: 78.3 fL — ABNORMAL LOW (ref 80.0–100.0)
Platelets: 179 10*3/uL (ref 150–400)
RBC: 4.66 MIL/uL (ref 4.22–5.81)
RDW: 20.3 % — AB (ref 11.5–15.5)
WBC: 8.7 10*3/uL (ref 4.0–10.5)
nRBC: 0 % (ref 0.0–0.2)

## 2018-06-04 LAB — GLUCOSE, CAPILLARY
Glucose-Capillary: 114 mg/dL — ABNORMAL HIGH (ref 70–99)
Glucose-Capillary: 86 mg/dL (ref 70–99)
Glucose-Capillary: 83 mg/dL (ref 70–99)
Glucose-Capillary: 89 mg/dL (ref 70–99)
Glucose-Capillary: 113 mg/dL — ABNORMAL HIGH (ref 70–99)
Glucose-Capillary: 304 mg/dL — ABNORMAL HIGH (ref 70–99)

## 2018-06-04 LAB — LIPASE, BLOOD: Lipase: 436 U/L — ABNORMAL HIGH (ref 11–51)

## 2018-06-04 LAB — TSH: TSH: 8.687 u[IU]/mL — ABNORMAL HIGH (ref 0.350–4.500)

## 2018-06-04 SURGERY — ERCP, WITH INTERVENTION IF INDICATED
Anesthesia: General

## 2018-06-04 MED ORDER — PRAVASTATIN SODIUM 20 MG PO TABS
40.0000 mg | ORAL_TABLET | Freq: Every day | ORAL | Status: DC
  Administered 2018-06-04 – 2018-06-06 (×3): 40 mg via ORAL
  Filled 2018-06-04 (×3): qty 2

## 2018-06-04 MED ORDER — OXYCODONE HCL ER 15 MG PO T12A
40.0000 mg | EXTENDED_RELEASE_TABLET | Freq: Every day | ORAL | Status: DC
  Administered 2018-06-04 – 2018-06-06 (×3): 40 mg via ORAL
  Filled 2018-06-04 (×3): qty 1

## 2018-06-04 MED ORDER — ONDANSETRON HCL 4 MG/2ML IJ SOLN: 4.0000 mg | Freq: Once | INTRAMUSCULAR | Status: DC | PRN

## 2018-06-04 MED ORDER — DULOXETINE HCL 60 MG PO CPEP
60.0000 mg | ORAL_CAPSULE | Freq: Every day | ORAL | Status: DC
  Administered 2018-06-04 – 2018-06-06 (×3): 60 mg via ORAL
  Filled 2018-06-04 (×3): qty 1

## 2018-06-04 MED ORDER — INSULIN ASPART 100 UNIT/ML ~~LOC~~ SOLN
0.0000 [IU] | Freq: Every day | SUBCUTANEOUS | Status: DC
  Filled 2018-06-04 (×2): qty 1

## 2018-06-04 MED ORDER — PANTOPRAZOLE SODIUM 40 MG PO TBEC
40.0000 mg | DELAYED_RELEASE_TABLET | Freq: Every day | ORAL | Status: DC
  Administered 2018-06-05 – 2018-06-06 (×2): 40 mg via ORAL
  Filled 2018-06-04 (×3): qty 1

## 2018-06-04 MED ORDER — TAMSULOSIN HCL 0.4 MG PO CAPS
0.4000 mg | ORAL_CAPSULE | Freq: Every day | ORAL | Status: DC
  Administered 2018-06-04 – 2018-06-06 (×3): 0.4 mg via ORAL
  Filled 2018-06-04 (×3): qty 1

## 2018-06-04 MED ORDER — LISINOPRIL 10 MG PO TABS
10.0000 mg | ORAL_TABLET | Freq: Every day | ORAL | Status: DC
  Administered 2018-06-04 – 2018-06-06 (×3): 10 mg via ORAL
  Filled 2018-06-04 (×3): qty 1

## 2018-06-04 MED ORDER — SODIUM CHLORIDE 0.9 % IV SOLN
INTRAVENOUS | Status: DC
  Administered 2018-06-04: 1000 mL via INTRAVENOUS

## 2018-06-04 MED ORDER — HYDROMORPHONE HCL 1 MG/ML IJ SOLN
1.0000 mg | Freq: Once | INTRAMUSCULAR | Status: AC
  Administered 2018-06-04: 1 mg via INTRAVENOUS
  Filled 2018-06-04: qty 1

## 2018-06-04 MED ORDER — FENTANYL CITRATE (PF) 100 MCG/2ML IJ SOLN: 25.0000 ug | INTRAMUSCULAR | Status: DC | PRN

## 2018-06-04 MED ORDER — ONDANSETRON HCL 4 MG/2ML IJ SOLN
4.0000 mg | Freq: Four times a day (QID) | INTRAMUSCULAR | Status: DC | PRN
  Administered 2018-06-04: 4 mg via INTRAVENOUS

## 2018-06-04 MED ORDER — LIDOCAINE 2% (20 MG/ML) 5 ML SYRINGE
INTRAMUSCULAR | Status: DC | PRN
  Administered 2018-06-04: 60 mg via INTRAVENOUS

## 2018-06-04 MED ORDER — DIPHENHYDRAMINE HCL 50 MG/ML IJ SOLN
12.5000 mg | Freq: Once | INTRAMUSCULAR | Status: AC
  Administered 2018-06-04: 12.5 mg via INTRAVENOUS
  Filled 2018-06-04: qty 1

## 2018-06-04 MED ORDER — FENTANYL CITRATE (PF) 100 MCG/2ML IJ SOLN
INTRAMUSCULAR | Status: DC | PRN
  Administered 2018-06-04: 100 ug via INTRAVENOUS

## 2018-06-04 MED ORDER — FUROSEMIDE 20 MG PO TABS
20.0000 mg | ORAL_TABLET | Freq: Two times a day (BID) | ORAL | Status: DC
  Administered 2018-06-04 – 2018-06-06 (×5): 20 mg via ORAL
  Filled 2018-06-04 (×5): qty 1

## 2018-06-04 MED ORDER — ONDANSETRON HCL 4 MG/2ML IJ SOLN
4.0000 mg | Freq: Once | INTRAMUSCULAR | Status: AC
  Administered 2018-06-04: 4 mg via INTRAVENOUS
  Filled 2018-06-04: qty 2

## 2018-06-04 MED ORDER — SUCCINYLCHOLINE CHLORIDE 20 MG/ML IJ SOLN
INTRAMUSCULAR | Status: DC | PRN
  Administered 2018-06-04: 100 mg via INTRAVENOUS

## 2018-06-04 MED ORDER — GABAPENTIN 300 MG PO CAPS
300.0000 mg | ORAL_CAPSULE | Freq: Three times a day (TID) | ORAL | Status: DC
  Administered 2018-06-04 – 2018-06-06 (×6): 300 mg via ORAL
  Filled 2018-06-04 (×6): qty 1

## 2018-06-04 MED ORDER — MORPHINE SULFATE (PF) 2 MG/ML IV SOLN
2.0000 mg | INTRAVENOUS | Status: DC | PRN
  Administered 2018-06-04: 2 mg via INTRAVENOUS
  Filled 2018-06-04: qty 1

## 2018-06-04 MED ORDER — METRONIDAZOLE IN NACL 5-0.79 MG/ML-% IV SOLN
500.0000 mg | Freq: Three times a day (TID) | INTRAVENOUS | Status: DC
  Administered 2018-06-04 – 2018-06-05 (×5): 500 mg via INTRAVENOUS
  Filled 2018-06-04 (×8): qty 100

## 2018-06-04 MED ORDER — DOCUSATE SODIUM 100 MG PO CAPS
100.0000 mg | ORAL_CAPSULE | Freq: Two times a day (BID) | ORAL | Status: DC
  Administered 2018-06-04 – 2018-06-06 (×3): 100 mg via ORAL
  Filled 2018-06-04 (×3): qty 1

## 2018-06-04 MED ORDER — FLUOXETINE HCL 20 MG PO CAPS
20.0000 mg | ORAL_CAPSULE | Freq: Every day | ORAL | Status: DC
  Administered 2018-06-04 – 2018-06-06 (×3): 20 mg via ORAL
  Filled 2018-06-04 (×3): qty 1

## 2018-06-04 MED ORDER — PHENYLEPHRINE HCL 10 MG/ML IJ SOLN
INTRAMUSCULAR | Status: DC | PRN
  Administered 2018-06-04: 100 ug via INTRAVENOUS
  Administered 2018-06-04: 200 ug via INTRAVENOUS
  Administered 2018-06-04 (×2): 100 ug via INTRAVENOUS

## 2018-06-04 MED ORDER — DEXMEDETOMIDINE HCL 200 MCG/2ML IV SOLN
INTRAVENOUS | Status: DC | PRN
  Administered 2018-06-04: 12 ug via INTRAVENOUS

## 2018-06-04 MED ORDER — INSULIN ASPART 100 UNIT/ML ~~LOC~~ SOLN
0.0000 [IU] | SUBCUTANEOUS | Status: DC
  Administered 2018-06-04: 7 [IU] via SUBCUTANEOUS
  Administered 2018-06-05 (×2): 2 [IU] via SUBCUTANEOUS
  Administered 2018-06-05: 3 [IU] via SUBCUTANEOUS
  Filled 2018-06-04 (×3): qty 1

## 2018-06-04 MED ORDER — DEXAMETHASONE SODIUM PHOSPHATE 10 MG/ML IJ SOLN
INTRAMUSCULAR | Status: DC | PRN
  Administered 2018-06-04: 10 mg via INTRAVENOUS

## 2018-06-04 MED ORDER — FUROSEMIDE 10 MG/ML IJ SOLN
40.0000 mg | Freq: Once | INTRAMUSCULAR | Status: AC
  Administered 2018-06-04: 40 mg via INTRAVENOUS
  Filled 2018-06-04: qty 4

## 2018-06-04 MED ORDER — SODIUM CHLORIDE 0.9 % IV SOLN
INTRAVENOUS | Status: DC
  Administered 2018-06-04 – 2018-06-05 (×4): via INTRAVENOUS

## 2018-06-04 MED ORDER — SODIUM CHLORIDE 0.9 % IV SOLN
2.0000 g | Freq: Once | INTRAVENOUS | Status: AC
  Administered 2018-06-04: 2 g via INTRAVENOUS
  Filled 2018-06-04: qty 2

## 2018-06-04 MED ORDER — FENTANYL CITRATE (PF) 100 MCG/2ML IJ SOLN
INTRAMUSCULAR | Status: AC
  Filled 2018-06-04: qty 2

## 2018-06-04 MED ORDER — PROPOFOL 10 MG/ML IV BOLUS
INTRAVENOUS | Status: DC | PRN
  Administered 2018-06-04: 150 mg via INTRAVENOUS

## 2018-06-04 MED ORDER — ONDANSETRON HCL 4 MG PO TABS: 4.0000 mg | ORAL_TABLET | Freq: Four times a day (QID) | ORAL | Status: DC | PRN

## 2018-06-04 MED ORDER — ANTIHEM FACTOR RECOMB (RFVIII) 3000 UNITS IV KIT
1.0000 | PACK | INTRAVENOUS | Status: DC
  Filled 2018-06-04: qty 1

## 2018-06-04 MED ORDER — EPHEDRINE SULFATE 50 MG/ML IJ SOLN
INTRAMUSCULAR | Status: DC | PRN
  Administered 2018-06-04 (×2): 10 mg via INTRAVENOUS

## 2018-06-04 MED ORDER — HYDROMORPHONE HCL 1 MG/ML IJ SOLN
1.0000 mg | INTRAMUSCULAR | Status: DC | PRN
  Administered 2018-06-04 – 2018-06-05 (×3): 1 mg via INTRAVENOUS
  Filled 2018-06-04 (×3): qty 1

## 2018-06-04 NOTE — Anesthesia Procedure Notes (Signed)
Procedure Name: Intubation Date/Time: 06/04/2018 2:42 PM Performed by: Marsh Dolly, CRNA Pre-anesthesia Checklist: Patient identified, Patient being monitored, Timeout performed, Emergency Drugs available and Suction available Patient Re-evaluated:Patient Re-evaluated prior to induction Oxygen Delivery Method: Circle system utilized Preoxygenation: Pre-oxygenation with 100% oxygen Induction Type: IV induction Ventilation: Mask ventilation without difficulty Laryngoscope Size: 3 and Miller Grade View: Grade I Tube type: Oral Tube size: 7.0 mm Number of attempts: 1 Placement Confirmation: ETT inserted through vocal cords under direct vision,  positive ETCO2 and breath sounds checked- equal and bilateral Secured at: 21 cm Tube secured with: Tape Dental Injury: Teeth and Oropharynx as per pre-operative assessment

## 2018-06-04 NOTE — Anesthesia Preprocedure Evaluation (Signed)
Anesthesia Evaluation  Patient identified by MRN, date of birth, ID band Patient awake    Reviewed: Allergy & Precautions, NPO status , Patient's Chart, lab work & pertinent test results  History of Anesthesia Complications Negative for: history of anesthetic complications  Airway Mallampati: III  TM Distance: >3 FB Neck ROM: Full    Dental  (+) Edentulous Upper, Poor Dentition   Pulmonary neg sleep apnea, neg COPD, former smoker,    breath sounds clear to auscultation- rhonchi (-) wheezing      Cardiovascular hypertension, Pt. on medications + CAD (nonocclusive)  (-) Past MI, (-) Cardiac Stents and (-) CABG  Rhythm:Regular Rate:Normal - Systolic murmurs and - Diastolic murmurs    Neuro/Psych neg Seizures Anxiety Depression negative neurological ROS     GI/Hepatic GERD  ,(+) Hepatitis -, C  Endo/Other  diabetes, Oral Hypoglycemic Agents  Renal/GU negative Renal ROS     Musculoskeletal  (+) Arthritis ,   Abdominal (+) + obese,   Peds  Hematology  (+) FACTOR VIII (HEMOPHILIA) ,   Anesthesia Other Findings Past Medical History: No date: Anxiety No date: Arthritis No date: Blood transfusion without reported diagnosis No date: Clotting disorder ([place]) No date: Depression No date: Diabetes mellitus without complication (HCC) No date: GERD (gastroesophageal reflux disease) No date: Hypertension No date: Neuromuscular disorder (HCC)   Reproductive/Obstetrics                             Anesthesia Physical Anesthesia Plan  ASA: III  Anesthesia Plan: General   Post-op Pain Management:    Induction: Intravenous  PONV Risk Score and Plan: 1 and Ondansetron  Airway Management Planned: Oral ETT  Additional Equipment:   Intra-op Plan:   Post-operative Plan: Extubation in OR  Informed Consent: I have reviewed the patients History and Physical, chart, labs and discussed the procedure  including the risks, benefits and alternatives for the proposed anesthesia with the patient or authorized representative who has indicated his/her understanding and acceptance.     Dental advisory given  Plan Discussed with: CRNA and Anesthesiologist  Anesthesia Plan Comments:         Anesthesia Quick Evaluation

## 2018-06-04 NOTE — Telephone Encounter (Signed)
Received call from spouse, [name]. Mr. Beer has been readmitted with pancreatitis. She is asking if EUS with biopsy can be performed sooner at [facility]. He will need to be discharged and we can arrange it there. Typically they need pancreatitis to be controlled before EUS. She will contact me once he is discharged and we can arrange at [facility].

## 2018-06-04 NOTE — Progress Notes (Signed)
Advanced care plan.  Purpose of the Encounter: CODE STATUS  Parties in Attendance: Patient himself Patient's Decision Capacity: Intact  Subjective/Patient's story: Patient 63 year old with diagnosis of liver cirrhosis, anxiety, depression, diabetes type 2, GERD, hypertension admitted with abdominal pain   Objective/Medical story I discussed with the patient regarding his desires for cardiac and pulmonary resuscitation.  Patient states that he does not want to be intubated would want CPR initially.   Goals of care determination:   Limited code without intubation  CODE STATUS: Limited code   Time spent discussing advanced care planning: 16 minutes

## 2018-06-04 NOTE — Progress Notes (Signed)
[place] at [facility]                                                                                                                                                                                  Patient Demographics   Shawn Cardenas, is a 63 y.o. male, DOB - February 21, 1956, LKG:[id number]  Admit date - 06/04/2018   Admitting Physician Harrie Foreman, MD  Outpatient Primary MD for the patient is [name], MD   LOS - 0  Subjective: Patient admitted with abdominal pain Continues to have abdominal pain. Complains of itching.    Review of Systems:   CONSTITUTIONAL: No documented fever. No fatigue, weakness. No weight gain, no weight loss.  EYES: No blurry or double vision.  ENT: No tinnitus. No postnasal drip. No redness of the oropharynx.  RESPIRATORY: No cough, no wheeze, no hemoptysis. No dyspnea.  CARDIOVASCULAR: No chest pain. No orthopnea. No palpitations. No syncope.  GASTROINTESTINAL: No nausea, no vomiting or diarrhea.  Positive abdominal pain. No melena or hematochezia.  GENITOURINARY: No dysuria or hematuria.  ENDOCRINE: No polyuria or nocturia. No heat or cold intolerance.  HEMATOLOGY: No anemia. No bruising. No bleeding.  INTEGUMENTARY: No rashes. No lesions.  MUSCULOSKELETAL: No arthritis. No swelling. No gout.  NEUROLOGIC: No numbness, tingling, or ataxia. No seizure-type activity.  PSYCHIATRIC: No anxiety. No insomnia. No ADD.    Vitals:   Vitals:   06/04/18 0545 06/04/18 0600 06/04/18 0637 06/04/18 0748  BP:  115/70 109/74 110/63  Pulse:   82 73  Resp: (!) _0 Temp:    98.2 F (36.8 C)  TempSrc:    Oral  SpO2:   97% 95%  Weight:      Height:        Wt Readings from Last 3 Encounters:  06/04/18 104.3 kg  05/31/18 99.5 kg  02/18/18 104.3 kg     Intake/Output Summary (Last 24 hours) at 06/04/2018 1104 Last data filed at 06/04/2018 1053 Gross per 24 hour  Intake 214.89 ml  Output 740 ml  Net -525.11 ml     Physical Exam:   GENERAL: Pleasant-appearing in no apparent distress.  HEAD, EYES, EARS, NOSE AND THROAT: Atraumatic, normocephalic. Extraocular muscles are intact. Pupils equal and reactive to light. Sclerae anicteric. No conjunctival injection. No oro-pharyngeal erythema.  NECK: Supple. There is no jugular venous distention. No bruits, no lymphadenopathy, no thyromegaly.  HEART: Regular rate and rhythm,. No murmurs, no rubs, no clicks.  LUNGS: Clear to auscultation bilaterally. No rales or rhonchi. No wheezes.  ABDOMEN: Soft, flat, epigastric tenderness, nondistended. Has good bowel sounds. No hepatosplenomegaly appreciated.  EXTREMITIES: No evidence  of any cyanosis, clubbing, or peripheral edema.  +2 pedal and radial pulses bilaterally.  NEUROLOGIC: The patient is alert, awake, and oriented x3 with no focal motor or sensory deficits appreciated bilaterally.  SKIN: Moist and warm with no rashes appreciated.  Psych: Not anxious, depressed LN: No inguinal LN enlargement    Antibiotics   Anti-infectives (From admission, onward)   Start     Dose/Rate Route Frequency Ordered Stop   06/04/18 0515  ceFEPIme (MAXIPIME) 2 g in sodium chloride 0.9 % 100 mL IVPB     2 g 200 mL/hr over 30 Minutes Intravenous  Once 06/04/18 0503 06/04/18 0555   06/04/18 0515  metroNIDAZOLE (FLAGYL) IVPB 500 mg     500 mg 100 mL/hr over 60 Minutes Intravenous Every 8 hours 06/04/18 0503        Medications   Scheduled Meds: . Antihemophilic Factor (Recomb)  1 kit Intravenous QODAY  . docusate sodium  100 mg Oral BID  . DULoxetine  60 mg Oral Daily  . FLUoxetine  20 mg Oral Daily  . furosemide  20 mg Oral BID  . gabapentin  300 mg Oral TID  . insulin aspart  0-5 Units Subcutaneous QHS  . insulin aspart  0-9 Units Subcutaneous Q4H  . lisinopril  10 mg Oral Daily  . oxyCODONE  40 mg Oral Daily  . pantoprazole  40 mg Oral Daily  . pravastatin  40 mg Oral Daily  . tamsulosin  0.4 mg Oral Daily    Continuous Infusions: . sodium chloride 125 mL/hr at 06/04/18 0953  . metronidazole Stopped (06/04/18 0707)   PRN Meds:.HYDROmorphone (DILAUDID) injection, morphine injection, ondansetron **OR** ondansetron (ZOFRAN) IV   Data Review:   Micro Results No results found for this or any previous visit (from the past 240 hour(s)).  Radiology Reports Dg Chest 2 View  Result Date: 06/04/2018 CLINICAL DATA:  History of pancreatitis and shortness of Breath EXAM: CHEST - 2 VIEW COMPARISON:  05/25/2018 FINDINGS: Cardiac shadow is stable. Left chest wall port is again seen. Diffuse vascular congestion and interstitial edema is again identified and stable. Small effusions are again seen bilaterally. No acute bony abnormality is noted. IMPRESSION: Changes of CHF with edema and effusions. Electronically Signed   By: Inez Catalina M.D.   On: 06/04/2018 00:41   Dg Chest 2 View  Result Date: 05/25/2018 CLINICAL DATA:  Left chest pain since this afternoon. [place] out of wheelchair this morning. EXAM: CHEST - 2 VIEW COMPARISON:  05/25/2018 FINDINGS: Cardiac enlargement with prominent pulmonary vascularity. Central interstitial pattern to the lungs likely interstitial edema. Small bilateral pleural effusions. Fluid in the fissures. No consolidation or pneumothorax. Mediastinal contours appear intact. Left central vascular line with tip over the cavoatrial junction region. Degenerative changes in both shoulders with degenerative fragmentation versus postoperative change in the distal right clavicle and acromion. IMPRESSION: Cardiac enlargement with pulmonary vascular congestion and interstitial edema. Small bilateral pleural effusions. No focal consolidation. Electronically Signed   By: Lucienne Capers M.D.   On: 05/25/2018 22:28   Dg Ribs Unilateral Left  Result Date: 05/26/2018 CLINICAL DATA:  Pt states he fell out of his wheelchair yesterday. Left axillary rib pain since. Pt admitted 05/23/2018 for pancreatitis.  Hx - HTN, neuromuscular disorder, DM, current smoker 2 ppd. EXAM: LEFT RIBS - 2 VIEW COMPARISON:  Current chest radiographs. Prior chest radiograph dated 02/18/2018. FINDINGS: No fracture or other bone lesions are seen involving the ribs. IMPRESSION: Negative. Electronically Signed   By: Shanon Brow  Ormond M.D.   On: 05/26/2018 14:28   Ct Abdomen Pelvis W Contrast  Result Date: 05/22/2018 CLINICAL DATA:  Epigastric pain for 2 months EXAM: CT ABDOMEN AND PELVIS WITH CONTRAST TECHNIQUE: Multidetector CT imaging of the abdomen and pelvis was performed using the standard protocol following bolus administration of intravenous contrast. CONTRAST:  133m OMNIPAQUE IOHEXOL 300 MG/ML  SOLN COMPARISON:  None. FINDINGS: Lower chest: Lung bases demonstrate bilateral pleural effusions right greater than left without focal infiltrate or sizable parenchymal nodule. Hepatobiliary: Liver is somewhat nodular in appearance consistent with underlying cirrhotic change. No focal mass lesion is seen. The gallbladder has been surgically removed. Very mild perihepatic fluid is noted. Pancreas: Pancreas is within normal limits. Spleen: Spleen is unremarkable with some mild perisplenic fluid. Adrenals/Urinary Tract: Adrenal glands are unremarkable. Kidneys are well visualized bilaterally. No renal calculi or obstructive changes are noted. The bladder is decompressed. Stomach/Bowel: Mild diverticular change of the colon is noted without evidence of diverticulitis. No obstructive changes are seen. The appendix is within normal limits. No small bowel abnormality or gastric abnormality is seen. Vascular/Lymphatic: Mild small mesenteric and retroperitoneal lymph nodes are seen likely reactive in nature. No sizable adenopathy is noted. Reproductive: Prostate is unremarkable. Other: Mild ascites is noted within the abdomen abdominal is surrounding the liver and spleen. Musculoskeletal: Mild degenerative changes of the lumbar spine are noted.  IMPRESSION: Changes of cirrhosis with mild ascites and bilateral pleural effusions. Normal-appearing appendix. Mild lymph nodes within the mesentery and retroperitoneum likely reactive in nature. No sizable adenopathy is noted. These results will be called to the ordering clinician or representative by the Radiologist Assistant, and communication documented in the PACS or zVision Dashboard. Electronically Signed   By: MInez CatalinaM.D.   On: 05/22/2018 13:05   Mr 3d Recon At Scanner  Result Date: 05/27/2018 CLINICAL DATA:  Acute pancreatitis EXAM: MRI ABDOMEN WITHOUT AND WITH CONTRAST (INCLUDING MRCP) TECHNIQUE: Multiplanar multisequence MR imaging of the abdomen was performed both before and after the administration of intravenous contrast. Heavily T2-weighted images of the biliary and pancreatic ducts were obtained, and three-dimensional MRCP images were rendered by post processing. CONTRAST:  10 cc Gadavist COMPARISON:  CT scan 05/22/2018 FINDINGS: Markedly motion degraded study. Lower chest: Moderate right and small left pleural effusions. Hepatobiliary: No suspicious focal abnormality within the liver parenchyma. Mild intrahepatic biliary duct dilatation evident the extrahepatic common duct measures up to 9 mm diameter. No evidence for stones within the common duct. 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 Gallbladder surgically absent. Pancreas: No dilatation of the main pancreatic duct. There is soft tissue fullness in the region of the pancreatic head, but no differential enhancement can be appreciated. No evidence for pancreatic pseudocyst. Spleen:  No splenomegaly. No focal mass lesion. Adrenals/Urinary Tract: No adrenal nodule or mass. 2.2 cm exophytic lesion posterior interpolar left kidney is compatible with a cyst. 1.2 cm cyst is identified in the upper pole of the left kidney. Stomach/Bowel: Stomach is unremarkable. No gastric wall thickening. No  evidence of outlet obstruction. Duodenum is normally positioned as is the ligament of Treitz. No small bowel or colonic dilatation within the visualized abdomen. Vascular/Lymphatic: No abdominal aortic aneurysm. There is no gastrohepatic or hepatoduodenal ligament lymphadenopathy. No intraperitoneal or retroperitoneal lymphadenopathy. Other: Diffuse edema is identified within the retroperitoneum, mesentery, and body wall. Musculoskeletal: No abnormal marrow enhancement within the visualized bony anatomy. IMPRESSION: 1. 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 There is some soft tissue fullness in the head of pancreas but no discrete or infiltrative mass can be discerned and there is no associated dilatation of the main pancreatic duct. No findings for choledocholithiasis. ERCP/EUS may prove helpful to further evaluate. Follow-up imaging could be performed when patient is better able to cooperate with positioning and breath holding. 2. Moderate right and small left pleural effusion associated with diffuse edema in the abdomen and body wall. 3. Left renal cysts. Electronically Signed   By: Misty Stanley M.D.   On: 05/27/2018 17:31   Dg Chest Port 1 View  Result Date: 05/25/2018 CLINICAL DATA:  Shortness of breath EXAM: PORTABLE CHEST 1 VIEW COMPARISON:  02/18/2018 FINDINGS: Unchanged cardiomegaly distorted by leftward rotation. There is left-sided porta catheter with tip at the upper cavoatrial junction. Generalized interstitial coarsening. Layering pleural fluid is again seen on the right at least. No pneumothorax. IMPRESSION: Bronchitic and/or congestive interstitial coarsening with small layering pleural fluid on the right at least. Electronically Signed   By: Monte Fantasia M.D.   On: 05/25/2018 05:26   Dg C-arm 1-60 Min-no Report  Result Date: 05/29/2018 Fluoroscopy was utilized by the requesting physician.  No  radiographic interpretation.   Mr Abdomen Mrcp W Wo Contast  Result Date: 05/27/2018 CLINICAL DATA:  Acute pancreatitis EXAM: MRI ABDOMEN WITHOUT AND WITH CONTRAST (INCLUDING MRCP) TECHNIQUE: Multiplanar multisequence MR imaging of the abdomen was performed both before and after the administration of intravenous contrast. Heavily T2-weighted images of the biliary and pancreatic ducts were obtained, and three-dimensional MRCP images were rendered by post processing. CONTRAST:  10 cc Gadavist COMPARISON:  CT scan 05/22/2018 FINDINGS: Markedly motion degraded study. Lower chest: Moderate right and small left pleural effusions. Hepatobiliary: No suspicious focal abnormality within the liver parenchyma. Mild intrahepatic biliary duct dilatation evident the extrahepatic common duct measures up to 9 mm diameter. No evidence for stones within the common duct. 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 Gallbladder surgically absent. Pancreas: No dilatation of the main pancreatic duct. There is soft tissue fullness in the region of the pancreatic head, but no differential enhancement can be appreciated. No evidence for pancreatic pseudocyst. Spleen:  No splenomegaly. No focal mass lesion. Adrenals/Urinary Tract: No adrenal nodule or mass. 2.2 cm exophytic lesion posterior interpolar left kidney is compatible with a cyst. 1.2 cm cyst is identified in the upper pole of the left kidney. Stomach/Bowel: Stomach is unremarkable. No gastric wall thickening. No evidence of outlet obstruction. Duodenum is normally positioned as is the ligament of Treitz. No small bowel or colonic dilatation within the visualized abdomen. Vascular/Lymphatic: No abdominal aortic aneurysm. There is no gastrohepatic or hepatoduodenal ligament lymphadenopathy. No intraperitoneal or retroperitoneal lymphadenopathy. Other: Diffuse edema is identified within the retroperitoneum, mesentery, and body wall.  Musculoskeletal: No abnormal marrow enhancement within the visualized bony anatomy. IMPRESSION: 1. 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 There is some soft tissue fullness in the head of pancreas but no discrete or infiltrative mass can be discerned and there is no associated dilatation of the main pancreatic duct. No findings for choledocholithiasis. ERCP/EUS may prove helpful to further evaluate. Follow-up imaging could be performed when patient is better able to cooperate with positioning and breath holding. 2. Moderate right and small left  pleural effusion associated with diffuse edema in the abdomen and body wall. 3. Left renal cysts. Electronically Signed   By: Misty Stanley M.D.   On: 05/27/2018 17:31     CBC Recent Labs  Lab 06/04/18 0010  WBC 8.7  HGB 11.9*  HCT 36.5*  PLT 179  MCV 78.3*  MCH 25.5*  MCHC 32.6  RDW 20.3*    Chemistries  Recent Labs  Lab 05/30/18 0837 05/31/18 0910 06/04/18 0010  NA  --  133* 134*  K  --  4.3 4.1  CL  --  102 100  CO2  --  25 27  GLUCOSE  --  113* 160*  BUN  --  28* 21  CREATININE  --  2.19* 1.38*  CALCIUM  --  8.8* 8.9  MG  --  1.6*  --   AST 58* 47* 93*  ALT 73* 59* 80*  ALKPHOS 278* 237* 359*  BILITOT 3.9* 2.9* 8.6*   ------------------------------------------------------------------------------------------------------------------ estimated creatinine clearance is 64.2 mL/min (A) (by C-G formula based on SCr of 1.38 mg/dL (H)). ------------------------------------------------------------------------------------------------------------------ No results for input(s): HGBA1C in the last 72 hours. ------------------------------------------------------------------------------------------------------------------ No results for input(s): CHOL, HDL, LDLCALC, TRIG, CHOLHDL, LDLDIRECT in the last 72  hours. ------------------------------------------------------------------------------------------------------------------ Recent Labs    06/04/18 0641  TSH 8.687*   ------------------------------------------------------------------------------------------------------------------ No results for input(s): VITAMINB12, FOLATE, FERRITIN, TIBC, IRON, RETICCTPCT in the last 72 hours.  Coagulation profile No results for input(s): INR, PROTIME in the last 168 hours.  No results for input(s): DDIMER in the last 72 hours.  Cardiac Enzymes Recent Labs  Lab 06/04/18 0010 06/04/18 0641  TROPONINI 0.04* 0.03*   ------------------------------------------------------------------------------------------------------------------ Invalid input(s): POCBNP    Assessment & Plan   This is a 63 year old male admitted abdominal pain 1.  Abdominal pain possibly due to recurrent Biliary obstruction: Elevated transaminases, bilirubin and alkaline phosphatase.    Continue n.p.o. for now GI consult has been placed 2.  Pancreatitis: Secondary to above.  IV allotted as needed for severe pain. 3.  Diabetes mellitus type 2: Hold oral medication.  Sliding scale insulin while hospitalized 4.  Hypertension: Controlled; continue to monitor 5.  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  BPH: Continue tamsulosin 7.  Hyperlipidemia: Continue statin therapy 8.  Depression: Continue Prozac 9.  DVT prophylaxis: SCDs 10.  GI prophylaxis: Pantoprazole per home regimen         Code Status Orders  (From admission, onward)         Start     Ordered   06/04/18 1103  Limited resuscitation (code)  Continuous    Question Answer Comment  In the event of cardiac or respiratory ARREST: Initiate Code Blue, Call Rapid Response Yes   In the event of cardiac or respiratory ARREST: Perform CPR Yes   In the event of cardiac or respiratory ARREST: Perform Intubation/Mechanical Ventilation No   In  the event of cardiac or respiratory ARREST: Use NIPPV/BiPAp only if indicated Yes   In the event of cardiac or respiratory ARREST: Administer ACLS medications if indicated Yes   In the event of cardiac or respiratory ARREST: Perform Defibrillation or Cardioversion if indicated Yes      06/04/18 1102        Code Status History    Date Active Date Inactive Code Status Order ID Comments User Context   06/04/2018 0628 06/04/2018 1102 Full Code [id number]  Harrie Foreman, MD Inpatient   05/23/2018 1616 05/31/2018 2129 Full Code [id number]  Gladstone Lighter, MD Inpatient           Consults gastroenterology  DVT Prophylaxis SCDs  Lab Results  Component Value Date   PLT 179 06/04/2018     Time Spent in minutes   45 minutes 10 AM to 10:45 AM  greater than 50% of time spent in care coordination and counseling patient regarding the condition and plan of care.   Dustin Flock M.D on 06/04/2018 at 11:04 AM  Between 7am to 6pm - Pager - [phone]  After 6pm go to [email or web address] - Proofreader  [employer]   Office  [phone]

## 2018-06-04 NOTE — ED Provider Notes (Signed)
[facility] Emergency Department Provider Note ____________________________________________   First MD Initiated Contact with Patient 06/04/18 [phone]     (approximate)  I have reviewed the triage vital signs and the nursing notes.   HISTORY  Chief Complaint Abdominal Pain    HPI Davit Vassar is a 63 y.o. male with PMH as noted below including recent admission for pancreatitis presents with recurrent epigastric pain over the last several days, persistent course, identical to prior pancreatitis, and associated with nausea but no vomiting.  It is not relieved by his home pain medications.  He states he feels the same as he did when he came to the hospital the last time.  He also reports shortness of breath and some edema to his lower extremity but no fever.   Past Medical History:  Diagnosis Date  . Anxiety   . Arthritis   . Blood transfusion without reported diagnosis   . Clotting disorder ([place])   . Depression   . Diabetes mellitus without complication ([place])   . GERD (gastroesophageal reflux disease)   . Hypertension   . Neuromuscular disorder [facility])     Patient Active Problem List   Diagnosis Date Noted  . Biliary obstruction 06/04/2018  . 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 Obstruction of bile duct   . Acute pancreatitis 05/23/2018  . Coronary artery calcification seen on CT scan 11/16/2017  . Family history of MI (myocardial infarction) 10/19/2015  . Obesity 10/19/2015  . 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 Smoker 10/19/2015  . Hyperlipidemia 10/19/2015  . 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 CHOLECYSTECTOMY    . ERCP N/A 05/29/2018   Procedure: ENDOSCOPIC RETROGRADE CHOLANGIOPANCREATOGRAPHY (ERCP);  Surgeon: [name], MD;  Location: [facility] ENDOSCOPY;  Service: Endoscopy;  Laterality: N/A;  . LEG  AMPUTATION Right     Prior to Admission medications   Medication Sig Start Date End Date Taking? Authorizing Provider  Antihemophilic Factor, Recomb, 5993 UNITS KIT Inject 1 kit into the vein every other day. Halixate or Kogenate   Yes [provider]  cephALEXin (KEFLEX) 500 MG capsule Take 1 capsule by mouth 2 (two) times daily. 03/09/17  Yes [provider]  DULoxetine (CYMBALTA) 60 MG capsule Take 60 mg by mouth daily.    Yes [provider]  esomeprazole (NEXIUM) 40 MG capsule Take 40 mg by mouth daily.    Yes [provider]  FLUoxetine (PROZAC) 20 MG capsule Take 20 mg by mouth daily.   Yes [provider]  furosemide (LASIX) 20 MG tablet Take 1 tablet (20 mg total) by mouth 2 (two) times daily. 05/26/18  Yes [name], MD  gabapentin (NEURONTIN) 300 MG capsule Take 300 mg by mouth 3 (three) times daily.    Yes [provider]  glipiZIDE (GLUCOTROL) 5 MG tablet Take 1 tablet (5 mg total) by mouth daily before breakfast. 05/26/18 05/26/19 Yes [name], MD  lisinopril (PRINIVIL,ZESTRIL) 10 MG tablet Take 1 tablet by mouth daily. 03/09/17  Yes [provider]  NARCAN 4 MG/0.1ML LIQD nasal spray kit Place 1 spray into both nostrils every 5 (five) minutes as needed (overdose).  03/14/17  Yes [provider]  oxyCODONE (OXYCONTIN) 10 mg 12 hr tablet Take 1 tablet (10 mg total) by mouth 2 (two) times daily as needed. Patient taking differently: Take 10 mg by mouth 2 (  two) times daily as needed (pain).  05/31/18  Yes [name], MD  oxyCODONE (OXYCONTIN) 40 mg 12 hr tablet Take 1 tablet (40 mg total) by mouth daily. 05/31/18  Yes [name], MD  pravastatin (PRAVACHOL) 40 MG tablet Take 1 tablet (40 mg total) by mouth daily. 02/27/18  Yes Gollan, Kathlene November, MD  tamsulosin (FLOMAX) 0.4 MG CAPS capsule Take 0.4 mg by mouth daily.    Yes [provider]    Allergies Aspirin and  Other  Family History  Problem Relation Age of Onset  . Heart Problems Mother   . Heart attack Father   . Heart attack Brother   . Heart attack Cousin     Social History Social History   Tobacco Use  . Smoking status: Current Every Day Smoker    Packs/day: 2.00    Years: 20.00    Pack years: 40.00    Types: Cigarettes    Last attempt to quit: 04/24/1978    Years since quitting: 40.1  . Smokeless tobacco: Never Used  Substance Use Topics  . Alcohol use: No  . Drug use: No    Review of Systems  Constitutional: No fever. Eyes: No redness. ENT: No sore throat. Cardiovascular: Denies chest pain. Respiratory: Positive for shortness of breath. Gastrointestinal: Positive for nausea. Genitourinary: Negative for dysuria.  Musculoskeletal: Negative for back pain. Skin: Negative for rash. Neurological: Negative for headache.   ____________________________________________   PHYSICAL EXAM:  VITAL SIGNS: ED Triage Vitals  Enc Vitals Group     BP 06/04/18 0007 134/89     Pulse Rate 06/04/18 0007 94     Resp 06/04/18 0007 18     Temp 06/04/18 0007 (!) 97.5 F (36.4 C)     Temp Source 06/04/18 0007 Oral     SpO2 06/04/18 0007 96 %     Weight 06/04/18 0006 230 lb (104.3 kg)     Height 06/04/18 0006 '5\' 8"'$  (1.727 m)     Head Circumference --      Peak Flow --      Pain Score 06/04/18 0006 8     Pain Loc --      Pain Edu? --      Excl. in [place]? --     Constitutional: Alert and oriented.  Uncomfortable appearing but in no acute distress. Eyes: Conjunctivae are normal.  Scleral icterus. Head: Atraumatic. Nose: No congestion/rhinnorhea. Mouth/Throat: Mucous membranes are somewhat dry.   Neck: Normal range of motion.  Cardiovascular: Good peripheral circulation. Respiratory: Normal respiratory effort.  No retractions.  Gastrointestinal: Soft with mild epigastric tenderness.  No distention.  Musculoskeletal: 2+ lower extremity edema.  Extremities warm and well perfused.   Neurologic:  Normal speech and language. No gross focal neurologic deficits are appreciated.  Skin:  Skin is warm and dry. No rash noted.  Patient appears jaundiced. Psychiatric: Mood and affect are normal. Speech and behavior are normal.  ____________________________________________   LABS (all labs ordered are listed, but only abnormal results are displayed)  Labs Reviewed  LIPASE, BLOOD - Abnormal; Notable for the following components:      Result Value   Lipase 436 (*)    All other components within normal limits  COMPREHENSIVE METABOLIC PANEL - Abnormal; Notable for the following components:   Sodium 134 (*)    Glucose, Bld 160 (*)    Creatinine, Ser 1.38 (*)    Albumin 3.1 (*)    AST 93 (*)    ALT 80 (*)  Alkaline Phosphatase 359 (*)    Total Bilirubin 8.6 (*)    GFR calc non Af Amer 54 (*)    All other components within normal limits  CBC - Abnormal; Notable for the following components:   Hemoglobin 11.9 (*)    HCT 36.5 (*)    MCV 78.3 (*)    MCH 25.5 (*)    RDW 20.3 (*)    All other components within normal limits  TROPONIN I - Abnormal; Notable for the following components:   Troponin I 0.04 (*)    All other components within normal limits  URINALYSIS, COMPLETE (UACMP) WITH MICROSCOPIC   ____________________________________________  EKG  ED ECG REPORT I, Arta Silence, the attending physician, personally viewed and interpreted this ECG.  Date: 06/04/2018 EKG Time: 1208 Rate: 93 Rhythm: normal sinus rhythm QRS Axis: normal Intervals: RBBB ST/T Wave abnormalities: normal Narrative Interpretation: no evidence of acute ischemia  ____________________________________________  RADIOLOGY    ____________________________________________   PROCEDURES  Procedure(s) performed: No  Procedures  Critical Care performed: No ____________________________________________   INITIAL IMPRESSION / ASSESSMENT AND PLAN / ED COURSE  Pertinent labs &  imaging results that were available during my care of the patient were reviewed by me and considered in my medical decision making (see chart for details).  63 year old male with PMH as noted above presents with recurrent epigastric pain, nausea, as well as some shortness of breath over the last several days since he was discharged from the hospital.  I reviewed the past medical records in [place].  The patient was admitted and discharged 4 days ago with acute abdominal pain and pancreatitis.  Initially it was thought to be related to his diabetic medications, however the patient had MRCP concerning for biliary stricture and a subsequent ERCP with stent placement.  He had significant abdominal pain after the stent but it improved and he was able to tolerate p.o. and was discharged home.  On exam today the vital signs are normal but the patient is uncomfortable appearing.  He appears jaundiced.  Abdomen is soft with some epigastric tenderness.  Work-up obtained from triage reveals lipase of 436 which is not elevated compared to when he left the hospital, however his alkaline phosphatase and bilirubin are both significantly elevated from his hospital stay.  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  I consulted Dr. Bonna Gains from gastroenterology.  She recommends admitting the patient and he will potentially need repeat ERCP.  She recommends gram-negative antibiotic coverage.  ----------------------------------------- 6:05 AM on 06/04/2018 -----------------------------------------  I signed the patient out to the hospitalist Dr. Marcille Blanco for admission. ____________________________________________   FINAL CLINICAL IMPRESSION(S) / ED DIAGNOSES  Final diagnoses:  Acute biliary pancreatitis, unspecified complication status      NEW MEDICATIONS STARTED DURING THIS VISIT:  New Prescriptions   No medications on file     Note:  This document was prepared using Dragon voice recognition  software and may include unintentional dictation errors.    Arta Silence, MD 06/04/18 [phone]

## 2018-06-04 NOTE — Transfer of Care (Signed)
Immediate Anesthesia Transfer of Care Note  Patient: Shawn Cardenas  Procedure(s) Performed: ENDOSCOPIC RETROGRADE CHOLANGIOPANCREATOGRAPHY (ERCP) (N/A )  Patient Location: PACU  Anesthesia Type:General  Level of Consciousness: awake, alert  and oriented  Airway & Oxygen Therapy: Patient Spontanous Breathing and Patient connected to face mask oxygen  Post-op Assessment: Report given to RN and Post -op Vital signs reviewed and stable  Post vital signs: Reviewed and stable  Last Vitals:  Vitals Value Taken Time  BP    Temp    Pulse    Resp    SpO2      Last Pain:  Vitals:   06/04/18 1353  TempSrc: Tympanic  PainSc: 7       Patients Stated Pain Goal: 0 (16/43/53 9122)  Complications: No apparent anesthesia complications

## 2018-06-04 NOTE — Consult Note (Signed)
[name], MD [facility]  [address], [place], [place] [address] Phone: [phone] Fax : [phone]  Consultation  Referring Provider:     Dr. Marcille Blanco Primary Care Physician:  [name], MD Primary Gastroenterologist:  Dr. Marius Ditch         Reason for Consultation:     Obstructive jaundice  Date of Admission:  06/04/2018 Date of Consultation:  06/04/2018         HPI:   Shawn Cardenas is a 63 y.o. male who was in the hospital recently with obstructive jaundice and underwent an MRCP and ERCP with stent placement.  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  The patient has a history of hemophilia.  The patient reports that he went home with continued abdominal pain and he continued until he came back and was admitted yesterday.  The patient had significant abdominal pain after the ERCP although the lipase did not jump much from his previous lipase level prior to the ERCP. The patient's total bilirubin was 5.0 a week ago and then came down to 2.9 after the ERCP.  The patient's liver enzymes had also come down from previous levels 9 days ago but appear to have increased slightly since his discharge. The patient's MRCP showed fullness in the head of the pancreas but no discrete or infiltrating mass could be discerned without any pancreatic ductal dilatation.  Past Medical History:  Diagnosis Date  . Anxiety   . Arthritis   . Blood transfusion without reported diagnosis   . Clotting disorder ([place])   . Depression   . Diabetes mellitus without complication ([place])   . GERD (gastroesophageal reflux disease)   . Hypertension   . Neuromuscular disorder [facility])     Past Surgical History:  Procedure Laterality Date  . CHOLECYSTECTOMY    . ERCP N/A 05/29/2018   Procedure: ENDOSCOPIC RETROGRADE CHOLANGIOPANCREATOGRAPHY (ERCP);  Surgeon: [name], MD;  Location:  [facility] ENDOSCOPY;  Service: Endoscopy;  Laterality: N/A;  . LEG AMPUTATION Right     Prior to Admission medications   Medication Sig Start Date End Date Taking? Authorizing Provider  Antihemophilic Factor, Recomb, 0867 UNITS KIT Inject 1 kit into the vein every other day. Halixate or Kogenate   Yes [provider]  cephALEXin (KEFLEX) 500 MG capsule Take 1 capsule by mouth 2 (two) times daily. 03/09/17  Yes [provider]  DULoxetine (CYMBALTA) 60 MG capsule Take 60 mg by mouth daily.    Yes [provider]  esomeprazole (NEXIUM) 40 MG capsule Take 40 mg by mouth daily.    Yes [provider]  FLUoxetine (PROZAC) 20 MG capsule Take 20 mg by mouth daily.   Yes [provider]  furosemide (LASIX) 20 MG tablet Take 1 tablet (20 mg total) by mouth 2 (two) times daily. 05/26/18  Yes [name], MD  gabapentin (NEURONTIN) 300 MG capsule Take 300 mg by mouth 3 (three) times daily.    Yes [provider]  glipiZIDE (GLUCOTROL) 5 MG tablet Take 1 tablet (5 mg total) by mouth daily before breakfast. 05/26/18 05/26/19 Yes [name], MD  lisinopril (PRINIVIL,ZESTRIL) 10 MG tablet Take 1 tablet by mouth daily. 03/09/17  Yes [provider]  NARCAN 4 MG/0.1ML LIQD nasal spray kit Place 1 spray into both nostrils every 5 (five) minutes as needed (overdose).  03/14/17  Yes [provider]  oxyCODONE (OXYCONTIN) 10 mg 12 hr tablet Take 1 tablet (10 mg total) by mouth 2 (two) times daily as needed. Patient taking differently: Take 10 mg by mouth 2 (two) times daily as needed (pain).  05/31/18  Yes [name], MD  oxyCODONE (OXYCONTIN) 40 mg 12 hr tablet Take 1 tablet (40 mg total) by mouth daily. 05/31/18  Yes [name], MD  pravastatin (PRAVACHOL) 40 MG tablet Take 1 tablet (40 mg total) by mouth daily. 02/27/18  Yes Gollan, Kathlene November, MD  tamsulosin (FLOMAX) 0.4 MG CAPS capsule Take 0.4 mg by mouth daily.     Yes [provider]    Family History  Problem Relation Age of Onset  . Heart Problems Mother   . Heart attack Father   . Heart attack Brother   . Heart attack Cousin      Social History   Tobacco Use  . Smoking status: Former Smoker    Packs/day: 2.00    Years: 20.00    Pack years: 40.00    Types: Cigarettes    Last attempt to quit: 04/24/2018    Years since quitting: 0.1  . Smokeless tobacco: Never Used  Substance Use Topics  . Alcohol use: No  . Drug use: No    Allergies as of 06/03/2018 - Review Complete 05/29/2018  Allergen Reaction Noted  . Aspirin Other (See Comments) 09/10/2014  . Other Other (See Comments) 07/10/2015    Review of Systems:    All systems reviewed and negative except where noted in HPI.   Physical Exam:  Vital signs in last 24 hours: Temp:  [97.5 F (36.4 C)-98.2 F (36.8 C)] 98.2 F (36.8 C) (02/10 0748) Pulse Rate:  [73-94] 79 (02/10 1300) Resp:  [13-30] 17 (02/10 0748) BP: (109-134)/(63-89) 125/78 (02/10 1300) SpO2:  [95 %-100 %] 95 % (02/10 0748) Weight:  [104.3 kg] 104.3 kg (02/10 0006)   General:   Pleasant, cooperative in NAD Head:  Normocephalic and atraumatic. Eyes:   No icterus.   Conjunctiva pink. PERRLA. Ears:  Normal auditory acuity. Neck:  Supple; no masses or thyroidomegaly Lungs: Respirations even and unlabored. Lungs clear to auscultation bilaterally.   No wheezes, crackles, or rhonchi.  Heart:  Regular rate and rhythm;  Without murmur, clicks, rubs or gallops Abdomen:  Soft, nondistended, nontender. Normal bowel sounds. No appreciable masses or hepatomegaly.  No rebound or guarding.  Rectal:  Not performed. Msk:  Symmetrical without gross deformities.  Extremities: Right BKA Neurologic:  Alert and oriented x3;  grossly normal neurologically. Skin:  Intact without significant lesions or rashes. Cervical Nodes:  No significant cervical adenopathy. Psych:  Alert and cooperative. Normal affect.  LAB  RESULTS: Recent Labs    06/04/18 0010  WBC 8.7  HGB 11.9*  HCT 36.5*  PLT 179   BMET Recent Labs    06/04/18 0010  NA 134*  K 4.1  CL 100  CO2 27  GLUCOSE 160*  BUN 21  CREATININE 1.38*  CALCIUM 8.9   LFT Recent Labs    06/04/18 0010  PROT 6.8  ALBUMIN 3.1*  AST 93*  ALT 80*  ALKPHOS 359*  BILITOT 8.6*   PT/INR No results for input(s): LABPROT, INR in the last 72 hours.  STUDIES: Dg Chest 2 View  Result Date: 06/04/2018 CLINICAL DATA:  History of pancreatitis and shortness of Breath EXAM: CHEST - 2 VIEW COMPARISON:  05/25/2018 FINDINGS: Cardiac shadow is stable. Left chest wall port is again seen. Diffuse vascular  congestion and interstitial edema is again identified and stable. Small effusions are again seen bilaterally. No acute bony abnormality is noted. IMPRESSION: Changes of CHF with edema and effusions. Electronically Signed   By: Inez Catalina M.D.   On: 06/04/2018 00:41      Impression / Plan:   Assessment: Active Problems:   Biliary obstruction   Shawn Cardenas is a 62 y.o. y/o male with a history of biliary obstruction with a stent placed a few days ago.  The patient now comes in with increased liver enzymes and an obstructive pattern with continued abdominal pain.  The patient's stent may have dislodged versus stent occlusion.  Plan:  The patient will be set up for a ERCP for today with change of his biliary stent. I have discussed risks & benefits which include, but are not limited to, bleeding, infection, perforation & drug reaction.  The patient agrees with this plan & written consent will be obtained.     Thank you for involving me in the care of this patient.      LOS: 0 days   [name], MD  06/04/2018, 1:37 PM    Note: This dictation was prepared with Dragon dictation along with smaller phrase technology. Any transcriptional errors that result from this process are unintentional.

## 2018-06-04 NOTE — H&P (Signed)
Shawn Cardenas is an 63 y.o. male.   Chief Complaint: Abdominal pain HPI: The patient with past medical history of hypertension, diabetes and hemophilia presents to the emergency department complaining of abdominal pain.  The patient was admitted to the hospital 11 days ago for biliary obstruction.  He underwent MRCP and ERCP for stent placement.  His lipase on that occasion was elevated but as the patient was able to eat and prove clinically he was discharged home.  However, the patient reports that his pain began to return almost immediately.  He has not been well since getting home.  Now he complains of itching as well.  Laboratory evaluation in the emergency department showed elevation in lipase slightly less than previous with concomitant transaminitis and elevated bilirubin as well as alkaline phosphatase which prompted the emergency department staff to call hospitalist service for admission.  Past Medical History:  Diagnosis Date  . Anxiety   . Arthritis   . Blood transfusion without reported diagnosis   . Clotting disorder ([place])   . Depression   . Diabetes mellitus without complication ([place])   . GERD (gastroesophageal reflux disease)   . Hypertension   . Neuromuscular disorder [facility])     Past Surgical History:  Procedure Laterality Date  . CHOLECYSTECTOMY    . ERCP N/A 05/29/2018   Procedure: ENDOSCOPIC RETROGRADE CHOLANGIOPANCREATOGRAPHY (ERCP);  Surgeon: [name], MD;  Location: [facility] ENDOSCOPY;  Service: Endoscopy;  Laterality: N/A;  . LEG AMPUTATION Right     Family History  Problem Relation Age of Onset  . Heart Problems Mother   . Heart attack Father   . Heart attack Brother   . Heart attack Cousin    Social History:  reports that he has been smoking cigarettes. He has a 40.00 pack-year smoking history. He has never used smokeless tobacco. He reports that he does not drink alcohol or use drugs.  Allergies:  Allergies  Allergen Reactions  . Aspirin Other (See Comments)     bleeding bleeding ASA contraindicated with bleeding disorder  . Other Other (See Comments)    NSAIDS contraindicated with bleeding disorder.    Medications Prior to Admission  Medication Sig Dispense Refill  . Antihemophilic Factor, Recomb, 3570 UNITS KIT Inject 1 kit into the vein every other day. Halixate or Kogenate    . cephALEXin (KEFLEX) 500 MG capsule Take 1 capsule by mouth 2 (two) times daily.    . DULoxetine (CYMBALTA) 60 MG capsule Take 60 mg by mouth daily.     Marland Kitchen esomeprazole (NEXIUM) 40 MG capsule Take 40 mg by mouth daily.     Marland Kitchen FLUoxetine (PROZAC) 20 MG capsule Take 20 mg by mouth daily.    . furosemide (LASIX) 20 MG tablet Take 1 tablet (20 mg total) by mouth 2 (two) times daily. 60 tablet 0  . gabapentin (NEURONTIN) 300 MG capsule Take 300 mg by mouth 3 (three) times daily.     Marland Kitchen glipiZIDE (GLUCOTROL) 5 MG tablet Take 1 tablet (5 mg total) by mouth daily before breakfast. 30 tablet 0  . lisinopril (PRINIVIL,ZESTRIL) 10 MG tablet Take 1 tablet by mouth daily.    Marland Kitchen NARCAN 4 MG/0.1ML LIQD nasal spray kit Place 1 spray into both nostrils every 5 (five) minutes as needed (overdose).     Marland Kitchen oxyCODONE (OXYCONTIN) 10 mg 12 hr tablet Take 1 tablet (10 mg total) by mouth 2 (two) times daily as needed. (Patient taking differently: Take 10 mg by mouth 2 (two) times daily as  needed (pain). ) 14 tablet 0  . oxyCODONE (OXYCONTIN) 40 mg 12 hr tablet Take 1 tablet (40 mg total) by mouth daily. 30 tablet 0  . pravastatin (PRAVACHOL) 40 MG tablet Take 1 tablet (40 mg total) by mouth daily. 30 tablet 3  . tamsulosin (FLOMAX) 0.4 MG CAPS capsule Take 0.4 mg by mouth daily.       Results for orders placed or performed during the hospital encounter of 06/04/18 (from the past 48 hour(s))  Lipase, blood     Status: Abnormal   Collection Time: 06/04/18 12:10 AM  Result Value Ref Range   Lipase 436 (H) 11 - 51 U/L    Comment: RESULT CONFIRMED BY MANUAL DILUTION.  TFK Performed at [facility], [address] [place], [place] [address]   Comprehensive metabolic panel     Status: Abnormal   Collection Time: 06/04/18 12:10 AM  Result Value Ref Range   Sodium 134 (L) 135 - 145 mmol/L   Potassium 4.1 3.5 - 5.1 mmol/L   Chloride 100 98 - 111 mmol/L   CO2 27 22 - 32 mmol/L   Glucose, Bld 160 (H) 70 - 99 mg/dL   BUN 21 8 - 23 mg/dL   Creatinine, Ser 1.38 (H) 0.61 - 1.24 mg/dL   Calcium 8.9 8.9 - 10.3 mg/dL   Total Protein 6.8 6.5 - 8.1 g/dL   Albumin 3.1 (L) 3.5 - 5.0 g/dL   AST 93 (H) 15 - 41 U/L   ALT 80 (H) 0 - 44 U/L   Alkaline Phosphatase 359 (H) 38 - 126 U/L   Total Bilirubin 8.6 (H) 0.3 - 1.2 mg/dL   GFR calc non Af Amer 54 (L) >60 mL/min   GFR calc Af Amer >60 >60 mL/min   Anion gap 7 5 - 15    Comment: Performed at [facility], [address] [place], [place] [address]  CBC     Status: Abnormal   Collection Time: 06/04/18 12:10 AM  Result Value Ref Range   WBC 8.7 4.0 - 10.5 K/uL   RBC 4.66 4.22 - 5.81 MIL/uL   Hemoglobin 11.9 (L) 13.0 - 17.0 g/dL   HCT 36.5 (L) 39.0 - 52.0 %   MCV 78.3 (L) 80.0 - 100.0 fL   MCH 25.5 (L) 26.0 - 34.0 pg   MCHC 32.6 30.0 - 36.0 g/dL   RDW 20.3 (H) 11.5 - 15.5 %   Platelets 179 150 - 400 K/uL   nRBC 0.0 0.0 - 0.2 %    Comment: Performed at [facility], [address] [place], [place] [address]  Troponin I - ONCE - STAT     Status: Abnormal   Collection Time: 06/04/18 12:10 AM  Result Value Ref Range   Troponin I 0.04 (HH) <0.03 ng/mL    Comment: CRITICAL RESULT CALLED TO, READ BACK BY AND VERIFIED WITH BRANDY DAVIS AT 6378 06/04/2018.  TFK Performed at [facility], [address] [place], [place] [address]   Urinalysis, Complete w Microscopic     Status: Abnormal   Collection Time: 06/04/18  6:16 AM  Result Value Ref Range   Color, Urine AMBER (A) YELLOW    Comment: BIOCHEMICALS MAY BE AFFECTED BY COLOR   APPearance CLEAR (A) CLEAR   Specific Gravity, Urine 1.004 (L) 1.005  - 1.030   pH 6.0 5.0 - 8.0   Glucose, UA NEGATIVE NEGATIVE mg/dL   Hgb urine dipstick SMALL (A) NEGATIVE   Bilirubin  Urine NEGATIVE NEGATIVE   Ketones, ur NEGATIVE NEGATIVE mg/dL   Protein, ur 100 (A) NEGATIVE mg/dL   Nitrite NEGATIVE NEGATIVE   Leukocytes, UA NEGATIVE NEGATIVE   WBC, UA 0-5 0 - 5 WBC/hpf   Bacteria, UA NONE SEEN NONE SEEN   Squamous Epithelial / LPF NONE SEEN 0 - 5   Mucus PRESENT     Comment: Performed at [facility], [address] [place], [place] [address]  Glucose, capillary     Status: Abnormal   Collection Time: 06/04/18  6:47 AM  Result Value Ref Range   Glucose-Capillary 114 (H) 70 - 99 mg/dL   Dg Chest 2 View  Result Date: 06/04/2018 CLINICAL DATA:  History of pancreatitis and shortness of Breath EXAM: CHEST - 2 VIEW COMPARISON:  05/25/2018 FINDINGS: Cardiac shadow is stable. Left chest wall port is again seen. Diffuse vascular congestion and interstitial edema is again identified and stable. Small effusions are again seen bilaterally. No acute bony abnormality is noted. IMPRESSION: Changes of CHF with edema and effusions. Electronically Signed   By: Inez Catalina M.D.   On: 06/04/2018 00:41    Review of Systems  Constitutional: Negative for chills and fever.  HENT: Negative for sore throat and tinnitus.   Eyes: Negative for blurred vision and redness.  Respiratory: Negative for cough and shortness of breath.   Cardiovascular: Negative for chest pain, palpitations, orthopnea and PND.  Gastrointestinal: Positive for abdominal pain. Negative for diarrhea, nausea and vomiting.  Genitourinary: Negative for dysuria, frequency and urgency.  Musculoskeletal: Negative for joint pain and myalgias.  Skin: Negative for rash.       No lesions  Neurological: Negative for speech change, focal weakness and weakness.  Endo/Heme/Allergies: Does not bruise/bleed easily.       No temperature intolerance  Psychiatric/Behavioral: Negative for depression and  suicidal ideas.    Blood pressure 109/74, pulse 82, temperature (!) 97.5 F (36.4 C), temperature source Oral, resp. rate 18, height '5\' 8"'$  (1.727 m), weight 104.3 kg, SpO2 97 %. Physical Exam  Vitals reviewed. Constitutional: He is oriented to person, place, and time. He appears well-developed and well-nourished. No distress.  HENT:  Head: Normocephalic and atraumatic.  Mouth/Throat: Oropharynx is clear and moist.  Eyes: Pupils are equal, round, and reactive to light. Conjunctivae and EOM are normal. Scleral icterus is present.  Neck: Normal range of motion. Neck supple. No JVD present. No tracheal deviation present. No thyromegaly present.  Cardiovascular: Normal rate and regular rhythm. Exam reveals no gallop and no friction rub.  No murmur heard. Respiratory: Effort normal and breath sounds normal. No respiratory distress.  GI: Soft. Bowel sounds are normal. He exhibits no distension. There is no abdominal tenderness.  Genitourinary:    Genitourinary Comments: Deferred   Musculoskeletal: Normal range of motion.        General: No edema.     Comments: Status post right BKA  Lymphadenopathy:    He has no cervical adenopathy.  Neurological: He is alert and oriented to person, place, and time.  Skin: Skin is warm and dry. Rash (Excoriations on arms) noted.  Jaundice  Psychiatric: He has a normal mood and affect. His behavior is normal. Judgment and thought content normal.     Assessment/Plan This is a 63 year old male admitted for biliary obstruction. 1.  Biliary obstruction: Elevated transaminases, bilirubin and alkaline phosphatase.  The patient is n.p.o. in anticipation of ERCP.  Consult gastroenterology.  Benadryl for itching secondary to hyperbilirubinemia. 2.  Pancreatitis: Secondary to above.  IV morphine as needed for severe pain. 3.  Diabetes mellitus type 2: Hold oral bedside.  Sliding scale insulin while hospitalized 4.  Hypertension: Controlled; continue to monitor 5.   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  BPH: Continue tamsulosin 7.  Hyperlipidemia: Continue statin therapy 8.  Depression: Continue Prozac 9.  DVT prophylaxis: SCDs 10.  GI prophylaxis: Pantoprazole per home regimen The patient is a full code.  Time spent on admission orders and patient care approximately 45 minutes  Harrie Foreman, MD 06/04/2018, 7:19 AM

## 2018-06-04 NOTE — ED Notes (Signed)
ED TO INPATIENT HANDOFF REPORT  ED Nurse Name and Phone #: [place] [employer] [phone]  Name/Age/Gender Shawn Cardenas 63 y.o. male Room/Bed: ED19A/ED19A  Code Status   Home/SNF/Other Home Patient oriented to: x 4 Is this baseline? Yes  Triage Complete: Triage complete  Chief Complaint Pancreatitis  Triage Note Pt states "I have pancreatitis". Pt states was seen with pancreatitis "a couple of days ago". Pt states he feels shob. Pt is drinking soda during triage and appears in no acute distress.    Allergies Allergies  Allergen Reactions  . Aspirin Other (See Comments)    bleeding bleeding ASA contraindicated with bleeding disorder  . Other Other (See Comments)    NSAIDS contraindicated with bleeding disorder.    Level of Care/Admitting Diagnosis ED Disposition    ED Disposition Condition [facility] Area: [place] [[id number]]  Level of Care: Med-Surg [16]  Diagnosis: Biliary obstruction [703500]  Admitting Physician: Harrie Foreman [[id number]]  Attending Physician: Harrie Foreman [[id number]]  Estimated length of stay: past midnight tomorrow  Certification:: I certify this patient will need inpatient services for at least 2 midnights  PT Class (Do Not Modify): Inpatient [101]  PT Acc Code (Do Not Modify): Private [1]       Medical/Surgery History Past Medical History:  Diagnosis Date  . Anxiety   . Arthritis   . Blood transfusion without reported diagnosis   . Clotting disorder ([place])   . Depression   . Diabetes mellitus without complication ([place])   . GERD (gastroesophageal reflux disease)   . Hypertension   . Neuromuscular disorder [facility])    Past Surgical History:  Procedure Laterality Date  . CHOLECYSTECTOMY    . ERCP N/A 05/29/2018   Procedure: ENDOSCOPIC RETROGRADE CHOLANGIOPANCREATOGRAPHY (ERCP);  Surgeon: [name], MD;  Location: [facility] ENDOSCOPY;  Service: Endoscopy;  Laterality: N/A;  . LEG AMPUTATION Right      IV  Location/Drains/Wounds Patient Lines/Drains/Airways Status   Active Line/Drains/Airways    Name:   Placement date:   Placement time:   Site:   Days:   Implanted Port 05/23/18 Left Chest   05/23/18    1448    Chest   12          Intake/Output Last 24 hours  Intake/Output Summary (Last 24 hours) at 06/04/2018 0602 Last data filed at 06/04/2018 7893 Gross per 24 hour  Intake 103.52 ml  Output -  Net 103.52 ml    Labs/Imaging Results for orders placed or performed during the hospital encounter of 06/04/18 (from the past 48 hour(s))  Lipase, blood     Status: Abnormal   Collection Time: 06/04/18 12:10 AM  Result Value Ref Range   Lipase 436 (H) 11 - 51 U/L    Comment: RESULT CONFIRMED BY MANUAL DILUTION.  TFK Performed at [facility], [address] [place], [place] [address]   Comprehensive metabolic panel     Status: Abnormal   Collection Time: 06/04/18 12:10 AM  Result Value Ref Range   Sodium 134 (L) 135 - 145 mmol/L   Potassium 4.1 3.5 - 5.1 mmol/L   Chloride 100 98 - 111 mmol/L   CO2 27 22 - 32 mmol/L   Glucose, Bld 160 (H) 70 - 99 mg/dL   BUN 21 8 - 23 mg/dL   Creatinine, Ser 1.38 (H) 0.61 - 1.24 mg/dL   Calcium 8.9 8.9 - 10.3 mg/dL   Total Protein 6.8 6.5 - 8.1 g/dL   Albumin  3.1 (L) 3.5 - 5.0 g/dL   AST 93 (H) 15 - 41 U/L   ALT 80 (H) 0 - 44 U/L   Alkaline Phosphatase 359 (H) 38 - 126 U/L   Total Bilirubin 8.6 (H) 0.3 - 1.2 mg/dL   GFR calc non Af Amer 54 (L) >60 mL/min   GFR calc Af Amer >60 >60 mL/min   Anion gap 7 5 - 15    Comment: Performed at [facility], [address] [place], [place] [address]  CBC     Status: Abnormal   Collection Time: 06/04/18 12:10 AM  Result Value Ref Range   WBC 8.7 4.0 - 10.5 K/uL   RBC 4.66 4.22 - 5.81 MIL/uL   Hemoglobin 11.9 (L) 13.0 - 17.0 g/dL   HCT 36.5 (L) 39.0 - 52.0 %   MCV 78.3 (L) 80.0 - 100.0 fL   MCH 25.5 (L) 26.0 - 34.0 pg   MCHC 32.6 30.0 - 36.0 g/dL   RDW 20.3 (H) 11.5 - 15.5 %    Platelets 179 150 - 400 K/uL   nRBC 0.0 0.0 - 0.2 %    Comment: Performed at [facility], [address] [place], [place] [address]  Troponin I - ONCE - STAT     Status: Abnormal   Collection Time: 06/04/18 12:10 AM  Result Value Ref Range   Troponin I 0.04 (HH) <0.03 ng/mL    Comment: CRITICAL RESULT CALLED TO, READ BACK BY AND VERIFIED WITH BRANDY DAVIS AT 7106 06/04/2018.  TFK Performed at [facility], [address] [place],  [place] [address]    Dg Chest 2 View  Result Date: 06/04/2018 CLINICAL DATA:  History of pancreatitis and shortness of Breath EXAM: CHEST - 2 VIEW COMPARISON:  05/25/2018 FINDINGS: Cardiac shadow is stable. Left chest wall port is again seen. Diffuse vascular congestion and interstitial edema is again identified and stable. Small effusions are again seen bilaterally. No acute bony abnormality is noted. IMPRESSION: Changes of CHF with edema and effusions. Electronically Signed   By: Inez Catalina M.D.   On: 06/04/2018 00:41    Pending Labs Unresulted Labs (From admission, onward)    Start     Ordered   06/04/18 0010  Urinalysis, Complete w Microscopic  ONCE - STAT,   STAT     06/04/18 0009   Signed and Held  Troponin I - Now Then Q6H  Now then every 6 hours,   R     Signed and Held   Signed and Held  TSH  Add-on,   R     Signed and Held          Vitals/Pain Today's Vitals   06/04/18 0500 06/04/18 0530 06/04/18 0545 06/04/18 0600  BP: 129/83 119/73  115/70  Pulse: 87     Resp: 13 (!) 21 (!) 30 18  Temp:      TempSrc:      SpO2: 98%     Weight:      Height:      PainSc:        Isolation Precautions No active isolations  Medications Medications  metroNIDAZOLE (FLAGYL) IVPB 500 mg (500 mg Intravenous New Bag/Given 06/04/18 0600)  HYDROmorphone (DILAUDID) injection 1 mg (1 mg Intravenous Given 06/04/18 0504)  ondansetron (ZOFRAN) injection 4 mg (4 mg Intravenous Given 06/04/18 0502)  furosemide (LASIX) injection 40 mg (40 mg  Intravenous Given 06/04/18 0500)  ceFEPIme (MAXIPIME) 2 g in sodium chloride 0.9 % 100  mL IVPB ( Intravenous Stopped 06/04/18 0555)    Mobility High fall risk

## 2018-06-04 NOTE — Op Note (Signed)
[facility] Patient Name: Shawn Cardenas Procedure Date: 06/04/2018 2:00 PM MRN: [id number] Account #: [id number] Date of Birth: Nov 21, 1955 Admit Type: Outpatient Age: 63 Room: [facility] ENDO ROOM 4 Gender: Male Note Status: Finalized Procedure:            ERCP Indications:          Abdominal pain of suspected biliary origin, Jaundice,                        Stent change Providers:            [name] MD, MD Medicines:            General Anesthesia Complications:        No immediate complications. Procedure:            Pre-Anesthesia Assessment:                       - Prior to the procedure, a History and Physical was                        performed, and patient medications and allergies were                        reviewed. The patient's tolerance of previous                        anesthesia was also reviewed. The risks and benefits of                        the procedure and the sedation options and risks were                        discussed with the patient. All questions were                        answered, and informed consent was obtained. Prior                        Anticoagulants: The patient has taken no previous                        anticoagulant or antiplatelet agents. ASA Grade                        Assessment: III - A patient with severe systemic                        disease. After reviewing the risks and benefits, the                        patient was deemed in satisfactory condition to undergo                        the procedure.                       After obtaining informed consent, the scope was passed                        under direct vision. Throughout the procedure, the  patient's blood pressure, pulse, and oxygen saturations                        were monitored continuously. The Duodenoscope was                        introduced through the mouth, and used to inject   contrast into and used to inject contrast into the bile                        duct. The ERCP was accomplished without difficulty. The                        patient tolerated the procedure well. Findings:      A biliary stent was visible on the scout film. The esophagus was       successfully intubated under direct vision. The scope was advanced to a       normal major papilla in the descending duodenum without detailed       examination of the pharynx, larynx and associated structures, and upper       GI tract. The upper GI tract was grossly normal. One stent was removed       from the biliary tree using a snare. The bile duct was deeply cannulated       with the short-nosed traction sphincterotome. Contrast was injected. I       personally interpreted the bile duct images. There was brisk flow of       contrast through the ducts. Image quality was excellent. Contrast       extended to the entire biliary tree. The lower third of the main bile       duct contained a single localized stenosis. The upper third of the main       bile duct, common hepatic duct and left and right hepatic ducts and all       intrahepatic branches were diffusely dilated. To discover objects, the       biliary tree was swept with a 15 mm balloon starting at the bifurcation.       Nothing was found. Cells for cytology were obtained by brushing in the       lower third of the main bile duct. One 10 Fr by 9 cm plastic stent with       a single external flap and a single internal flap was placed 7 cm into       the common bile duct. Bile flowed through the stent. The stent was in       good position. One 7 Fr by 7 cm plastic stent with a single external       flap and a single internal flap was placed 5 cm into the common bile       duct. Bile flowed through the stent. The stent was in good position. 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                       - The left and right hepatic ducts and all intrahepatic  branches, upper third of the main bile duct and common                        hepatic duct were dilated.                       - One stent was removed from the biliary tree.                       - The biliary tree was swept and nothing was found.                       - Cells for cytology obtained in the lower third of the                        main duct.                       - One plastic stent was placed into the common bile                        duct.                       - One plastic stent was placed into the common bile                        duct. Recommendation:       - Return patient to hospital ward for ongoing care.                       - Resume previous diet.                       - Repeat ERCP in 3 months to exchange stent. Procedure Code(s):    --- Professional ---                       [phone], Endoscopic retrograde cholangiopancreatography                        (ERCP); with removal and exchange of stent(s), biliary                        or pancreatic duct, including pre- and post-dilation                        and guide wire passage, when performed, including                        sphincterotomy, when performed, each stent exchanged                       43276, 59, Endoscopic retrograde                        cholangiopancreatography (ERCP); with removal and                        exchange of stent(s), biliary or pancreatic duct,  including pre- and post-dilation and guide wire                        passage, when performed, including sphincterotomy, when                        performed, each stent exchanged                       [phone], Endoscopic catheterization of the biliary ductal                        system, radiological supervision and interpretation Diagnosis Code(s):    --- Professional ---                        R17, Unspecified jaundice                       K83.8, Other specified diseases of biliary tract                       Z46.59, Encounter for fitting and adjustment of other                        gastrointestinal appliance and device                       K83.1, Obstruction of bile duct CPT copyright 2018 [employer]. All rights reserved. The codes documented in this report are preliminary and upon coder review may  be revised to meet current compliance requirements. [name] MD, MD 06/04/2018 3:15:24 PM This report has been signed electronically. Number of Addenda: 0 Note Initiated On: 06/04/2018 2:00 PM      [facility]

## 2018-06-04 NOTE — Anesthesia Post-op Follow-up Note (Signed)
Anesthesia QCDR form completed.        

## 2018-06-04 NOTE — ED Triage Notes (Signed)
Pt states "I have pancreatitis". Pt states was seen with pancreatitis "a couple of days ago". Pt states he feels shob. Pt is drinking soda during triage and appears in no acute distress.

## 2018-06-05 LAB — COMPREHENSIVE METABOLIC PANEL
ALT: 58 U/L — AB (ref 0–44)
AST: 52 U/L — AB (ref 15–41)
Albumin: 2.8 g/dL — ABNORMAL LOW (ref 3.5–5.0)
Alkaline Phosphatase: 299 U/L — ABNORMAL HIGH (ref 38–126)
Anion gap: 8 (ref 5–15)
BUN: 23 mg/dL (ref 8–23)
CO2: 26 mmol/L (ref 22–32)
CREATININE: 1.43 mg/dL — AB (ref 0.61–1.24)
Calcium: 8.5 mg/dL — ABNORMAL LOW (ref 8.9–10.3)
Chloride: 102 mmol/L (ref 98–111)
GFR calc Af Amer: 60 mL/min — ABNORMAL LOW (ref >60)
GFR calc non Af Amer: 52 mL/min — ABNORMAL LOW (ref >60)
Glucose, Bld: 234 mg/dL — ABNORMAL HIGH (ref 70–99)
Potassium: 4 mmol/L (ref 3.5–5.1)
Sodium: 136 mmol/L (ref 135–145)
Total Bilirubin: 3.7 mg/dL — ABNORMAL HIGH (ref 0.3–1.2)
Total Protein: 6.5 g/dL (ref 6.5–8.1)

## 2018-06-05 LAB — GLUCOSE, CAPILLARY
Glucose-Capillary: 200 mg/dL — ABNORMAL HIGH (ref 70–99)
Glucose-Capillary: 173 mg/dL — ABNORMAL HIGH (ref 70–99)
Glucose-Capillary: 228 mg/dL — ABNORMAL HIGH (ref 70–99)
Glucose-Capillary: 179 mg/dL — ABNORMAL HIGH (ref 70–99)
Glucose-Capillary: 165 mg/dL — ABNORMAL HIGH (ref 70–99)

## 2018-06-05 LAB — CYTOLOGY - NON PAP

## 2018-06-05 LAB — TROPONIN I: Troponin I: 0.03 ng/mL (ref <0.03)

## 2018-06-05 MED ORDER — OXYCODONE HCL 5 MG PO TABS
5.0000 mg | ORAL_TABLET | Freq: Four times a day (QID) | ORAL | Status: DC | PRN
  Administered 2018-06-05: 5 mg via ORAL
  Filled 2018-06-05: qty 1

## 2018-06-05 MED ORDER — INSULIN ASPART 100 UNIT/ML ~~LOC~~ SOLN
0.0000 [IU] | Freq: Three times a day (TID) | SUBCUTANEOUS | Status: DC
  Administered 2018-06-06: 2 [IU] via SUBCUTANEOUS
  Filled 2018-06-05: qty 1

## 2018-06-05 MED ORDER — INSULIN ASPART 100 UNIT/ML ~~LOC~~ SOLN
0.0000 [IU] | Freq: Three times a day (TID) | SUBCUTANEOUS | Status: DC
  Administered 2018-06-05: 2 [IU] via SUBCUTANEOUS
  Filled 2018-06-05: qty 1

## 2018-06-05 MED ORDER — MORPHINE SULFATE (PF) 4 MG/ML IV SOLN: 4.0000 mg | INTRAVENOUS | Status: DC | PRN

## 2018-06-05 NOTE — Progress Notes (Signed)
Notified Dr. Jannifer Franklin that patient's blood sugar is 165 and has order for Novolog 0-5 daily at bedtime and Novolog 0-9 ACHS

## 2018-06-05 NOTE — Progress Notes (Signed)
Notified Dr. Jannifer Franklin of patient's complaint of back pain. Patient has nothing orally ordered. MD to place orders.

## 2018-06-05 NOTE — Progress Notes (Signed)
[place] at [facility]                                                                                                                                                                                  Patient Demographics   Shawn Cardenas, is a 63 y.o. male, DOB - 1955-12-10, HKV:[id number]  Admit date - 06/04/2018   Admitting Physician Harrie Foreman, MD  Outpatient Primary MD for the patient is [name], MD   LOS - 1  Subjective: Patient states that his abdominal pain is resolved however now has urinary retention.  Review of Systems:   CONSTITUTIONAL: No documented fever. No fatigue, weakness. No weight gain, no weight loss.  EYES: No blurry or double vision.  ENT: No tinnitus. No postnasal drip. No redness of the oropharynx.  RESPIRATORY: No cough, no wheeze, no hemoptysis. No dyspnea.  CARDIOVASCULAR: No chest pain. No orthopnea. No palpitations. No syncope.  GASTROINTESTINAL: No nausea, no vomiting or diarrhea.  Positive abdominal pain. No melena or hematochezia.  GENITOURINARY: No dysuria or hematuria.  ENDOCRINE: No polyuria or nocturia. No heat or cold intolerance.  HEMATOLOGY: No anemia. No bruising. No bleeding.  INTEGUMENTARY: No rashes. No lesions.  MUSCULOSKELETAL: No arthritis. No swelling. No gout.  NEUROLOGIC: No numbness, tingling, or ataxia. No seizure-type activity.  PSYCHIATRIC: No anxiety. No insomnia. No ADD.    Vitals:   Vitals:   06/04/18 2013 06/04/18 2215 06/05/18 0500 06/05/18 0720  BP: 133/86 (!) 144/96  120/85  Pulse: 94 96  91  Resp: 18 18    Temp: 97.7 F (36.5 C) 98.1 F (36.7 C)  98.2 F (36.8 C)  TempSrc: Oral Oral  Oral  SpO2: 98% 98%  100%  Weight:   98.1 kg   Height:        Wt Readings from Last 3 Encounters:  06/05/18 98.1 kg  05/31/18 99.5 kg  02/18/18 104.3 kg     Intake/Output Summary (Last 24 hours) at 06/05/2018 1258 Last data filed at 06/05/2018 1049 Gross per 24 hour  Intake 1416.28  ml  Output 775 ml  Net 641.28 ml    Physical Exam:   GENERAL: Pleasant-appearing in no apparent distress.  HEAD, EYES, EARS, NOSE AND THROAT: Atraumatic, normocephalic. Extraocular muscles are intact. Pupils equal and reactive to light. Sclerae anicteric. No conjunctival injection. No oro-pharyngeal erythema.  NECK: Supple. There is no jugular venous distention. No bruits, no lymphadenopathy, no thyromegaly.  HEART: Regular rate and rhythm,. No murmurs, no rubs, no clicks.  LUNGS: Clear to auscultation bilaterally. No rales or rhonchi. No wheezes.  ABDOMEN: Soft, flat, epigastric tenderness, nondistended. Has good bowel sounds. No hepatosplenomegaly  appreciated.  EXTREMITIES: No evidence of any cyanosis, clubbing, or peripheral edema.  +2 pedal and radial pulses bilaterally.  NEUROLOGIC: The patient is alert, awake, and oriented x3 with no focal motor or sensory deficits appreciated bilaterally.  SKIN: Moist and warm with no rashes appreciated.  Psych: Not anxious, depressed LN: No inguinal LN enlargement    Antibiotics   Anti-infectives (From admission, onward)   Start     Dose/Rate Route Frequency Ordered Stop   06/04/18 0515  ceFEPIme (MAXIPIME) 2 g in sodium chloride 0.9 % 100 mL IVPB     2 g 200 mL/hr over 30 Minutes Intravenous  Once 06/04/18 0503 06/04/18 0555   06/04/18 0515  metroNIDAZOLE (FLAGYL) IVPB 500 mg     500 mg 100 mL/hr over 60 Minutes Intravenous Every 8 hours 06/04/18 0503        Medications   Scheduled Meds: . Antihemophilic Factor (Recomb)  1 kit Intravenous QODAY  . docusate sodium  100 mg Oral BID  . DULoxetine  60 mg Oral Daily  . FLUoxetine  20 mg Oral Daily  . furosemide  20 mg Oral BID  . gabapentin  300 mg Oral TID  . insulin aspart  0-5 Units Subcutaneous QHS  . insulin aspart  0-9 Units Subcutaneous TID AC & HS  . lisinopril  10 mg Oral Daily  . oxyCODONE  40 mg Oral Daily  . pantoprazole  40 mg Oral Daily  . pravastatin  40 mg Oral Daily   . tamsulosin  0.4 mg Oral Daily   Continuous Infusions: . metronidazole 500 mg (06/05/18 1249)   PRN Meds:.HYDROmorphone (DILAUDID) injection, morphine injection, ondansetron **OR** ondansetron (ZOFRAN) IV   Data Review:   Micro Results No results found for this or any previous visit (from the past 240 hour(s)).  Radiology Reports Dg Chest 2 View  Result Date: 06/04/2018 CLINICAL DATA:  History of pancreatitis and shortness of Breath EXAM: CHEST - 2 VIEW COMPARISON:  05/25/2018 FINDINGS: Cardiac shadow is stable. Left chest wall port is again seen. Diffuse vascular congestion and interstitial edema is again identified and stable. Small effusions are again seen bilaterally. No acute bony abnormality is noted. IMPRESSION: Changes of CHF with edema and effusions. Electronically Signed   By: Inez Catalina M.D.   On: 06/04/2018 00:41   Dg Chest 2 View  Result Date: 05/25/2018 CLINICAL DATA:  Left chest pain since this afternoon. [place] out of wheelchair this morning. EXAM: CHEST - 2 VIEW COMPARISON:  05/25/2018 FINDINGS: Cardiac enlargement with prominent pulmonary vascularity. Central interstitial pattern to the lungs likely interstitial edema. Small bilateral pleural effusions. Fluid in the fissures. No consolidation or pneumothorax. Mediastinal contours appear intact. Left central vascular line with tip over the cavoatrial junction region. Degenerative changes in both shoulders with degenerative fragmentation versus postoperative change in the distal right clavicle and acromion. IMPRESSION: Cardiac enlargement with pulmonary vascular congestion and interstitial edema. Small bilateral pleural effusions. No focal consolidation. Electronically Signed   By: Lucienne Capers M.D.   On: 05/25/2018 22:28   Dg Ribs Unilateral Left  Result Date: 05/26/2018 CLINICAL DATA:  Pt states he fell out of his wheelchair yesterday. Left axillary rib pain since. Pt admitted 05/23/2018 for pancreatitis. Hx - HTN,  neuromuscular disorder, DM, current smoker 2 ppd. EXAM: LEFT RIBS - 2 VIEW COMPARISON:  Current chest radiographs. Prior chest radiograph dated 02/18/2018. FINDINGS: No fracture or other bone lesions are seen involving the ribs. IMPRESSION: Negative. Electronically Signed   By: Shanon Brow  Ormond M.D.   On: 05/26/2018 14:28   Ct Abdomen Pelvis W Contrast  Result Date: 05/22/2018 CLINICAL DATA:  Epigastric pain for 2 months EXAM: CT ABDOMEN AND PELVIS WITH CONTRAST TECHNIQUE: Multidetector CT imaging of the abdomen and pelvis was performed using the standard protocol following bolus administration of intravenous contrast. CONTRAST:  163m OMNIPAQUE IOHEXOL 300 MG/ML  SOLN COMPARISON:  None. FINDINGS: Lower chest: Lung bases demonstrate bilateral pleural effusions right greater than left without focal infiltrate or sizable parenchymal nodule. Hepatobiliary: Liver is somewhat nodular in appearance consistent with underlying cirrhotic change. No focal mass lesion is seen. The gallbladder has been surgically removed. Very mild perihepatic fluid is noted. Pancreas: Pancreas is within normal limits. Spleen: Spleen is unremarkable with some mild perisplenic fluid. Adrenals/Urinary Tract: Adrenal glands are unremarkable. Kidneys are well visualized bilaterally. No renal calculi or obstructive changes are noted. The bladder is decompressed. Stomach/Bowel: Mild diverticular change of the colon is noted without evidence of diverticulitis. No obstructive changes are seen. The appendix is within normal limits. No small bowel abnormality or gastric abnormality is seen. Vascular/Lymphatic: Mild small mesenteric and retroperitoneal lymph nodes are seen likely reactive in nature. No sizable adenopathy is noted. Reproductive: Prostate is unremarkable. Other: Mild ascites is noted within the abdomen abdominal is surrounding the liver and spleen. Musculoskeletal: Mild degenerative changes of the lumbar spine are noted. IMPRESSION:  Changes of cirrhosis with mild ascites and bilateral pleural effusions. Normal-appearing appendix. Mild lymph nodes within the mesentery and retroperitoneum likely reactive in nature. No sizable adenopathy is noted. These results will be called to the ordering clinician or representative by the Radiologist Assistant, and communication documented in the PACS or zVision Dashboard. Electronically Signed   By: MInez CatalinaM.D.   On: 05/22/2018 13:05   Mr 3d Recon At Scanner  Result Date: 05/27/2018 CLINICAL DATA:  Acute pancreatitis EXAM: MRI ABDOMEN WITHOUT AND WITH CONTRAST (INCLUDING MRCP) TECHNIQUE: Multiplanar multisequence MR imaging of the abdomen was performed both before and after the administration of intravenous contrast. Heavily T2-weighted images of the biliary and pancreatic ducts were obtained, and three-dimensional MRCP images were rendered by post processing. CONTRAST:  10 cc Gadavist COMPARISON:  CT scan 05/22/2018 FINDINGS: Markedly motion degraded study. Lower chest: Moderate right and small left pleural effusions. Hepatobiliary: No suspicious focal abnormality within the liver parenchyma. Mild intrahepatic biliary duct dilatation evident the extrahepatic common duct measures up to 9 mm diameter. No evidence for stones within the common duct. 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 Gallbladder surgically absent. Pancreas: No dilatation of the main pancreatic duct. There is soft tissue fullness in the region of the pancreatic head, but no differential enhancement can be appreciated. No evidence for pancreatic pseudocyst. Spleen:  No splenomegaly. No focal mass lesion. Adrenals/Urinary Tract: No adrenal nodule or mass. 2.2 cm exophytic lesion posterior interpolar left kidney is compatible with a cyst. 1.2 cm cyst is identified in the upper pole of the left kidney. Stomach/Bowel: Stomach is unremarkable. No gastric wall thickening. No evidence of outlet  obstruction. Duodenum is normally positioned as is the ligament of Treitz. No small bowel or colonic dilatation within the visualized abdomen. Vascular/Lymphatic: No abdominal aortic aneurysm. There is no gastrohepatic or hepatoduodenal ligament lymphadenopathy. No intraperitoneal or retroperitoneal lymphadenopathy. Other: Diffuse edema is identified within the retroperitoneum, mesentery, and body wall. Musculoskeletal: No abnormal marrow enhancement within the visualized bony anatomy. IMPRESSION: 1. 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 There is some soft tissue fullness in the head of pancreas but no discrete or infiltrative mass can be discerned and there is no associated dilatation of the main pancreatic duct. No findings for choledocholithiasis. ERCP/EUS may prove helpful to further evaluate. Follow-up imaging could be performed when patient is better able to cooperate with positioning and breath holding. 2. Moderate right and small left pleural effusion associated with diffuse edema in the abdomen and body wall. 3. Left renal cysts. Electronically Signed   By: Misty Stanley M.D.   On: 05/27/2018 17:31   Dg Chest Port 1 View  Result Date: 05/25/2018 CLINICAL DATA:  Shortness of breath EXAM: PORTABLE CHEST 1 VIEW COMPARISON:  02/18/2018 FINDINGS: Unchanged cardiomegaly distorted by leftward rotation. There is left-sided porta catheter with tip at the upper cavoatrial junction. Generalized interstitial coarsening. Layering pleural fluid is again seen on the right at least. No pneumothorax. IMPRESSION: Bronchitic and/or congestive interstitial coarsening with small layering pleural fluid on the right at least. Electronically Signed   By: Monte Fantasia M.D.   On: 05/25/2018 05:26   Dg C-arm 1-60 Min-no Report  Result Date: 06/04/2018 Fluoroscopy was utilized by the requesting physician.  No radiographic  interpretation.   Dg C-arm 1-60 Min-no Report  Result Date: 05/29/2018 Fluoroscopy was utilized by the requesting physician.  No radiographic interpretation.   Mr Abdomen Mrcp W Wo Contast  Result Date: 05/27/2018 CLINICAL DATA:  Acute pancreatitis EXAM: MRI ABDOMEN WITHOUT AND WITH CONTRAST (INCLUDING MRCP) TECHNIQUE: Multiplanar multisequence MR imaging of the abdomen was performed both before and after the administration of intravenous contrast. Heavily T2-weighted images of the biliary and pancreatic ducts were obtained, and three-dimensional MRCP images were rendered by post processing. CONTRAST:  10 cc Gadavist COMPARISON:  CT scan 05/22/2018 FINDINGS: Markedly motion degraded study. Lower chest: Moderate right and small left pleural effusions. Hepatobiliary: No suspicious focal abnormality within the liver parenchyma. Mild intrahepatic biliary duct dilatation evident the extrahepatic common duct measures up to 9 mm diameter. No evidence for stones within the common duct. 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 Gallbladder surgically absent. Pancreas: No dilatation of the main pancreatic duct. There is soft tissue fullness in the region of the pancreatic head, but no differential enhancement can be appreciated. No evidence for pancreatic pseudocyst. Spleen:  No splenomegaly. No focal mass lesion. Adrenals/Urinary Tract: No adrenal nodule or mass. 2.2 cm exophytic lesion posterior interpolar left kidney is compatible with a cyst. 1.2 cm cyst is identified in the upper pole of the left kidney. Stomach/Bowel: Stomach is unremarkable. No gastric wall thickening. No evidence of outlet obstruction. Duodenum is normally positioned as is the ligament of Treitz. No small bowel or colonic dilatation within the visualized abdomen. Vascular/Lymphatic: No abdominal aortic aneurysm. There is no gastrohepatic or hepatoduodenal ligament lymphadenopathy. No intraperitoneal  or retroperitoneal lymphadenopathy. Other: Diffuse edema is identified within the retroperitoneum, mesentery, and body wall. Musculoskeletal: No abnormal marrow enhancement within the visualized bony anatomy. IMPRESSION: 1. 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 There is some soft tissue fullness in the head of pancreas but no discrete or infiltrative mass can be discerned and there is no associated dilatation of the main pancreatic duct. No findings for choledocholithiasis. ERCP/EUS may prove helpful to further evaluate. Follow-up  imaging could be performed when patient is better able to cooperate with positioning and breath holding. 2. Moderate right and small left pleural effusion associated with diffuse edema in the abdomen and body wall. 3. Left renal cysts. Electronically Signed   By: Misty Stanley M.D.   On: 05/27/2018 17:31     CBC Recent Labs  Lab 06/04/18 0010  WBC 8.7  HGB 11.9*  HCT 36.5*  PLT 179  MCV 78.3*  MCH 25.5*  MCHC 32.6  RDW 20.3*    Chemistries  Recent Labs  Lab 05/30/18 0837 05/31/18 0910 06/04/18 0010 06/05/18 1049  NA  --  133* 134* 136  K  --  4.3 4.1 4.0  CL  --  102 100 102  CO2  --  '25 27 26  '$ GLUCOSE  --  113* 160* 234*  BUN  --  28* 21 23  CREATININE  --  2.19* 1.38* 1.43*  CALCIUM  --  8.8* 8.9 8.5*  MG  --  1.6*  --   --   AST 58* 47* 93* 52*  ALT 73* 59* 80* 58*  ALKPHOS 278* 237* 359* 299*  BILITOT 3.9* 2.9* 8.6* 3.7*   ------------------------------------------------------------------------------------------------------------------ estimated creatinine clearance is 60.1 mL/min (A) (by C-G formula based on SCr of 1.43 mg/dL (H)). ------------------------------------------------------------------------------------------------------------------ No results for input(s): HGBA1C in the last 72  hours. ------------------------------------------------------------------------------------------------------------------ No results for input(s): CHOL, HDL, LDLCALC, TRIG, CHOLHDL, LDLDIRECT in the last 72 hours. ------------------------------------------------------------------------------------------------------------------ Recent Labs    06/04/18 0641  TSH 8.687*   ------------------------------------------------------------------------------------------------------------------ No results for input(s): VITAMINB12, FOLATE, FERRITIN, TIBC, IRON, RETICCTPCT in the last 72 hours.  Coagulation profile No results for input(s): INR, PROTIME in the last 168 hours.  No results for input(s): DDIMER in the last 72 hours.  Cardiac Enzymes Recent Labs  Lab 06/04/18 0641 06/04/18 1229 06/04/18 1830  TROPONINI 0.03* <0.03 0.03*   ------------------------------------------------------------------------------------------------------------------ Invalid input(s): POCBNP    Assessment & Plan   This is a 63 year old male admitted abdominal pain 1.  Abdominal pain possibly due to recurrent Biliary obstruction: Repeat ERCP done yesterday patient doing better follow liver function test 2.  Urinary retention we will do in and out cath for now patient is on Flomax which we will continue you may need further in and out cath 3.  Diabetes mellitus type 2: Hold oral medication.  Sliding scale insulin while hospitalized 4.  Hypertension: Controlled; continue to monitor 5.  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  BPH: Continue tamsulosin 7.  Hyperlipidemia: Continue statin therapy 8.  Depression: Continue Prozac 9.  DVT prophylaxis: SCDs 10.  GI prophylaxis: Pantoprazole per home regimen         Code Status Orders  (From admission, onward)         Start     Ordered   06/04/18 1103  Limited resuscitation (code)  Continuous    Question Answer Comment  In the  event of cardiac or respiratory ARREST: Initiate Code Blue, Call Rapid Response Yes   In the event of cardiac or respiratory ARREST: Perform CPR Yes   In the event of cardiac or respiratory ARREST: Perform Intubation/Mechanical Ventilation No   In the event of cardiac or respiratory ARREST: Use NIPPV/BiPAp only if indicated Yes   In the event of cardiac or respiratory ARREST: Administer ACLS medications if indicated Yes   In the event of cardiac or respiratory ARREST: Perform Defibrillation or Cardioversion if indicated Yes  06/04/18 1102        Code Status History    Date Active Date Inactive Code Status Order ID Comments User Context   06/04/2018 0628 06/04/2018 1102 Full Code [id number]  Harrie Foreman, MD Inpatient   05/23/2018 1616 05/31/2018 2129 Full Code [id number]  Gladstone Lighter, MD Inpatient           Consults gastroenterology  DVT Prophylaxis SCDs  Lab Results  Component Value Date   PLT 179 06/04/2018     Time Spent in minutes   35 minutes greater than 50% of time spent in care coordination and counseling patient regarding the condition and plan of care.   Dustin Flock M.D on 06/05/2018 at 12:58 PM  Between 7am to 6pm - Pager - [phone]  After 6pm go to [email or web address] - Proofreader  [employer]   Office  [phone]

## 2018-06-05 NOTE — Progress Notes (Signed)
[name], MD [facility]   [address] [place], [place] [address] Phone: [phone] Fax : [phone]   Subjective: The patient underwent an ERCP yesterday with placement of 2 stents.  The patient had further brushings of the common bile duct sent off to pathology.  He reports that he is doing well today with better appetite and has no pain.  The patient's liver enzymes also came down overnight.  His bilirubin decreased from 8.6-3.7.   Objective: Vital signs in last 24 hours: Vitals:   06/04/18 2013 06/04/18 2215 06/05/18 0500 06/05/18 0720  BP: 133/86 (!) 144/96  120/85  Pulse: 94 96  91  Resp: 18 18    Temp: 97.7 F (36.5 C) 98.1 F (36.7 C)  98.2 F (36.8 C)  TempSrc: Oral Oral  Oral  SpO2: 98% 98%  100%  Weight:   98.1 kg   Height:       Weight change: -6.227 kg  Intake/Output Summary (Last 24 hours) at 06/05/2018 1424 Last data filed at 06/05/2018 1049 Gross per 24 hour  Intake 1416.28 ml  Output 450 ml  Net 966.28 ml     Exam: Heart:: Regular rate and rhythm, S1S2 present or without murmur or extra heart sounds Lungs: normal and clear to auscultation and percussion Abdomen: soft, nontender, normal bowel sounds   Lab Results: _0 @ Micro Results: No results found for this or any previous visit (from the past 240 hour(s)). Studies/Results: Dg Chest 2 View  Result Date: 06/04/2018 CLINICAL DATA:  History of pancreatitis and shortness of Breath EXAM: CHEST - 2 VIEW COMPARISON:  05/25/2018 FINDINGS: Cardiac shadow is stable. Left chest wall port is again seen. Diffuse vascular congestion and interstitial edema is again identified and stable. Small effusions are again seen bilaterally. No acute bony abnormality is noted. IMPRESSION: Changes of CHF with edema and effusions. Electronically Signed   By: Inez Catalina M.D.   On: 06/04/2018 00:41   Dg C-arm 1-60 Min-no Report  Result Date: 06/04/2018 Fluoroscopy was utilized by the requesting physician.   No radiographic interpretation.   Medications: I have reviewed the patient's current medications. Scheduled Meds: . Antihemophilic Factor (Recomb)  1 kit Intravenous QODAY  . docusate sodium  100 mg Oral BID  . DULoxetine  60 mg Oral Daily  . FLUoxetine  20 mg Oral Daily  . furosemide  20 mg Oral BID  . gabapentin  300 mg Oral TID  . insulin aspart  0-5 Units Subcutaneous QHS  . insulin aspart  0-9 Units Subcutaneous TID AC & HS  . lisinopril  10 mg Oral Daily  . oxyCODONE  40 mg Oral Daily  . pantoprazole  40 mg Oral Daily  . pravastatin  40 mg Oral Daily  . tamsulosin  0.4 mg Oral Daily   Continuous Infusions: . metronidazole 500 mg (06/05/18 1249)   PRN Meds:.HYDROmorphone (DILAUDID) injection, morphine injection, ondansetron **OR** ondansetron (ZOFRAN) IV   Assessment: Active Problems:   Biliary obstruction   Jaundice    Plan: This patient has 2 stents placed in his common bile duct for drainage.  The patient also had brushings done of the stricture in the bile duct that was suspicious for malignant cells previously.  The patient feels much better today.  He will need an endoscopic ultrasound to assess whether this is a pancreatic cancer or a common bile duct cancer.  Nothing further to do from a GI point of view.  I will sign off.  Please call if  any further GI concerns or questions.  We would like to thank you for the opportunity to participate in the care of Cylus [place].     LOS: 1 day   [name] 06/05/2018, 2:24 PM

## 2018-06-06 ENCOUNTER — Encounter: Payer: Self-pay | Admitting: Gastroenterology

## 2018-06-06 LAB — COMPREHENSIVE METABOLIC PANEL
ALT: 50 U/L — ABNORMAL HIGH (ref 0–44)
AST: 35 U/L (ref 15–41)
Albumin: 2.7 g/dL — ABNORMAL LOW (ref 3.5–5.0)
Alkaline Phosphatase: 272 U/L — ABNORMAL HIGH (ref 38–126)
Anion gap: 5 (ref 5–15)
BUN: 29 mg/dL — ABNORMAL HIGH (ref 8–23)
CO2: 27 mmol/L (ref 22–32)
Calcium: 8.4 mg/dL — ABNORMAL LOW (ref 8.9–10.3)
Chloride: 106 mmol/L (ref 98–111)
Creatinine, Ser: 1.38 mg/dL — ABNORMAL HIGH (ref 0.61–1.24)
GFR calc Af Amer: >60 mL/min (ref >60)
GFR calc non Af Amer: 54 mL/min — ABNORMAL LOW (ref >60)
Glucose, Bld: 193 mg/dL — ABNORMAL HIGH (ref 70–99)
Potassium: 3.8 mmol/L (ref 3.5–5.1)
Sodium: 138 mmol/L (ref 135–145)
Total Bilirubin: 2.8 mg/dL — ABNORMAL HIGH (ref 0.3–1.2)
Total Protein: 6.9 g/dL (ref 6.5–8.1)

## 2018-06-06 LAB — GLUCOSE, CAPILLARY
Glucose-Capillary: 156 mg/dL — ABNORMAL HIGH (ref 70–99)
Glucose-Capillary: 116 mg/dL — ABNORMAL HIGH (ref 70–99)

## 2018-06-06 LAB — T4, FREE: FREE T4: 0.75 ng/dL — AB (ref 0.82–1.77)

## 2018-06-06 MED ORDER — POLYETHYLENE GLYCOL 3350 17 G PO PACK
17.0000 g | PACK | Freq: Every day | ORAL | Status: DC
  Administered 2018-06-06: 17 g via ORAL
  Filled 2018-06-06: qty 1

## 2018-06-06 MED ORDER — SODIUM CHLORIDE 0.9% FLUSH: 10.0000 mL | Freq: Two times a day (BID) | INTRAVENOUS | Status: DC

## 2018-06-06 MED ORDER — SODIUM CHLORIDE 0.9% FLUSH: 10.0000 mL | INTRAVENOUS | Status: DC | PRN

## 2018-06-06 MED ORDER — SENNA 8.6 MG PO TABS
2.0000 | ORAL_TABLET | Freq: Once | ORAL | Status: AC
  Administered 2018-06-06: 17.2 mg via ORAL
  Filled 2018-06-06: qty 2

## 2018-06-06 MED ORDER — LACTULOSE 10 GM/15ML PO SOLN: 30.0000 g | Freq: Every day | ORAL | Status: DC | PRN

## 2018-06-06 MED ORDER — LEVOTHYROXINE SODIUM 50 MCG PO TABS: 50.0000 ug | ORAL_TABLET | Freq: Every day | ORAL | Status: DC

## 2018-06-06 NOTE — Discharge Summary (Signed)
[place] at [facility], 63 y.o., DOB Dec 16, 1955, MRN [id number]. Admission date: 06/04/2018 Discharge Date 06/06/2018 Primary MD [name], MD Admitting Physician Harrie Foreman, MD  Admission Diagnosis  Acute biliary pancreatitis, unspecified complication status [G86.76]  Discharge Diagnosis   Active Problems: Abdominal pain due to biliary obstruction Urinary retention Diabetes type 2 Essential hypertension Hemophilia BPH Hyperlipidemia Depression    Hospital Course  Shawn Cardenas  is a 63 y.o. 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  Patient had a recent stent placed to his common bile duct.  He was discharged home.  Return back with abdominal pain.  Again his liver function tests were elevated consistent with obstruction of biliary duct.  Patient was seen in consultation by GI who went to perform the ERCP and placed 2 stents.  Patient's liver function is now trending down.  His abdominal pain is now resolved.  He is doing much better.  Patient stable for discharge with outpatient follow-up with GI.           Consults  GI  Significant Tests:  See full reports for all details     Dg Chest 2 View  Result Date: 06/04/2018 CLINICAL DATA:  History of pancreatitis and shortness of Breath EXAM: CHEST - 2 VIEW COMPARISON:  05/25/2018 FINDINGS: Cardiac shadow is stable. Left chest wall port is again seen. Diffuse vascular congestion and interstitial edema is again identified and stable. Small effusions are again seen bilaterally. No acute bony abnormality is noted. IMPRESSION: Changes of CHF with edema and effusions. Electronically Signed   By: Inez Catalina M.D.   On: 06/04/2018 00:41   Dg Chest 2 View  Result Date: 05/25/2018 CLINICAL DATA:  Left  chest pain since this afternoon. [place] out of wheelchair this morning. EXAM: CHEST - 2 VIEW COMPARISON:  05/25/2018 FINDINGS: Cardiac enlargement with prominent pulmonary vascularity. Central interstitial pattern to the lungs likely interstitial edema. Small bilateral pleural effusions. Fluid in the fissures. No consolidation or pneumothorax. Mediastinal contours appear intact. Left central vascular line with tip over the cavoatrial junction region. Degenerative changes in both shoulders with degenerative fragmentation versus postoperative change in the distal right clavicle and acromion. IMPRESSION: Cardiac enlargement with pulmonary vascular congestion and interstitial edema. Small bilateral pleural effusions. No focal consolidation. Electronically Signed   By: Lucienne Capers M.D.   On: 05/25/2018 22:28   Dg Ribs Unilateral Left  Result Date: 05/26/2018 CLINICAL DATA:  Pt states he fell out of his wheelchair yesterday. Left axillary rib pain since. Pt admitted 05/23/2018 for pancreatitis. Hx - HTN, neuromuscular disorder, DM, current smoker 2 ppd. EXAM: LEFT RIBS - 2 VIEW COMPARISON:  Current chest radiographs. Prior chest radiograph dated 02/18/2018. FINDINGS: No fracture or other bone lesions are seen involving the ribs. IMPRESSION: Negative. Electronically Signed   By: Lajean Manes M.D.   On: 05/26/2018 14:28   Ct Abdomen Pelvis W Contrast  Result Date: 05/22/2018 CLINICAL DATA:  Epigastric pain for 2 months EXAM: CT ABDOMEN AND PELVIS WITH CONTRAST TECHNIQUE: Multidetector CT imaging of the abdomen and pelvis was performed using the standard protocol following bolus administration of intravenous contrast. CONTRAST:  160m OMNIPAQUE IOHEXOL 300 MG/ML  SOLN COMPARISON:  None. FINDINGS: Lower chest: Lung bases demonstrate bilateral pleural effusions right greater than  left without focal infiltrate or sizable parenchymal nodule. Hepatobiliary: Liver is somewhat nodular in appearance consistent with  underlying cirrhotic change. No focal mass lesion is seen. The gallbladder has been surgically removed. Very mild perihepatic fluid is noted. Pancreas: Pancreas is within normal limits. Spleen: Spleen is unremarkable with some mild perisplenic fluid. Adrenals/Urinary Tract: Adrenal glands are unremarkable. Kidneys are well visualized bilaterally. No renal calculi or obstructive changes are noted. The bladder is decompressed. Stomach/Bowel: Mild diverticular change of the colon is noted without evidence of diverticulitis. No obstructive changes are seen. The appendix is within normal limits. No small bowel abnormality or gastric abnormality is seen. Vascular/Lymphatic: Mild small mesenteric and retroperitoneal lymph nodes are seen likely reactive in nature. No sizable adenopathy is noted. Reproductive: Prostate is unremarkable. Other: Mild ascites is noted within the abdomen abdominal is surrounding the liver and spleen. Musculoskeletal: Mild degenerative changes of the lumbar spine are noted. IMPRESSION: Changes of cirrhosis with mild ascites and bilateral pleural effusions. Normal-appearing appendix. Mild lymph nodes within the mesentery and retroperitoneum likely reactive in nature. No sizable adenopathy is noted. These results will be called to the ordering clinician or representative by the Radiologist Assistant, and communication documented in the PACS or zVision Dashboard. Electronically Signed   By: Inez Catalina M.D.   On: 05/22/2018 13:05   Mr 3d Recon At Scanner  Result Date: 05/27/2018 CLINICAL DATA:  Acute pancreatitis EXAM: MRI ABDOMEN WITHOUT AND WITH CONTRAST (INCLUDING MRCP) TECHNIQUE: Multiplanar multisequence MR imaging of the abdomen was performed both before and after the administration of intravenous contrast. Heavily T2-weighted images of the biliary and pancreatic ducts were obtained, and three-dimensional MRCP images were rendered by post processing. CONTRAST:  10 cc Gadavist COMPARISON:   CT scan 05/22/2018 FINDINGS: Markedly motion degraded study. Lower chest: Moderate right and small left pleural effusions. Hepatobiliary: No suspicious focal abnormality within the liver parenchyma. Mild intrahepatic biliary duct dilatation evident the extrahepatic common duct measures up to 9 mm diameter. No evidence for stones within the common duct. 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 Gallbladder surgically absent. Pancreas: No dilatation of the main pancreatic duct. There is soft tissue fullness in the region of the pancreatic head, but no differential enhancement can be appreciated. No evidence for pancreatic pseudocyst. Spleen:  No splenomegaly. No focal mass lesion. Adrenals/Urinary Tract: No adrenal nodule or mass. 2.2 cm exophytic lesion posterior interpolar left kidney is compatible with a cyst. 1.2 cm cyst is identified in the upper pole of the left kidney. Stomach/Bowel: Stomach is unremarkable. No gastric wall thickening. No evidence of outlet obstruction. Duodenum is normally positioned as is the ligament of Treitz. No small bowel or colonic dilatation within the visualized abdomen. Vascular/Lymphatic: No abdominal aortic aneurysm. There is no gastrohepatic or hepatoduodenal ligament lymphadenopathy. No intraperitoneal or retroperitoneal lymphadenopathy. Other: Diffuse edema is identified within the retroperitoneum, mesentery, and body wall. Musculoskeletal: No abnormal marrow enhancement within the visualized bony anatomy. IMPRESSION: 1. 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 There is some soft tissue fullness in the head of pancreas but no discrete or infiltrative mass can be discerned and there is no associated dilatation of the main pancreatic duct. No findings for choledocholithiasis. ERCP/EUS may prove helpful to further evaluate. Follow-up imaging  could be performed when patient is better able to cooperate with positioning and breath holding. 2. Moderate right  and small left pleural effusion associated with diffuse edema in the abdomen and body wall. 3. Left renal cysts. Electronically Signed   By: Misty Stanley M.D.   On: 05/27/2018 17:31   Dg Chest Port 1 View  Result Date: 05/25/2018 CLINICAL DATA:  Shortness of breath EXAM: PORTABLE CHEST 1 VIEW COMPARISON:  02/18/2018 FINDINGS: Unchanged cardiomegaly distorted by leftward rotation. There is left-sided porta catheter with tip at the upper cavoatrial junction. Generalized interstitial coarsening. Layering pleural fluid is again seen on the right at least. No pneumothorax. IMPRESSION: Bronchitic and/or congestive interstitial coarsening with small layering pleural fluid on the right at least. Electronically Signed   By: Monte Fantasia M.D.   On: 05/25/2018 05:26   Dg C-arm 1-60 Min-no Report  Result Date: 06/04/2018 Fluoroscopy was utilized by the requesting physician.  No radiographic interpretation.   Dg C-arm 1-60 Min-no Report  Result Date: 05/29/2018 Fluoroscopy was utilized by the requesting physician.  No radiographic interpretation.   Mr Abdomen Mrcp W Wo Contast  Result Date: 05/27/2018 CLINICAL DATA:  Acute pancreatitis EXAM: MRI ABDOMEN WITHOUT AND WITH CONTRAST (INCLUDING MRCP) TECHNIQUE: Multiplanar multisequence MR imaging of the abdomen was performed both before and after the administration of intravenous contrast. Heavily T2-weighted images of the biliary and pancreatic ducts were obtained, and three-dimensional MRCP images were rendered by post processing. CONTRAST:  10 cc Gadavist COMPARISON:  CT scan 05/22/2018 FINDINGS: Markedly motion degraded study. Lower chest: Moderate right and small left pleural effusions. Hepatobiliary: No suspicious focal abnormality within the liver parenchyma. Mild intrahepatic biliary duct dilatation evident the extrahepatic common duct measures  up to 9 mm diameter. No evidence for stones within the common duct. 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 Gallbladder surgically absent. Pancreas: No dilatation of the main pancreatic duct. There is soft tissue fullness in the region of the pancreatic head, but no differential enhancement can be appreciated. No evidence for pancreatic pseudocyst. Spleen:  No splenomegaly. No focal mass lesion. Adrenals/Urinary Tract: No adrenal nodule or mass. 2.2 cm exophytic lesion posterior interpolar left kidney is compatible with a cyst. 1.2 cm cyst is identified in the upper pole of the left kidney. Stomach/Bowel: Stomach is unremarkable. No gastric wall thickening. No evidence of outlet obstruction. Duodenum is normally positioned as is the ligament of Treitz. No small bowel or colonic dilatation within the visualized abdomen. Vascular/Lymphatic: No abdominal aortic aneurysm. There is no gastrohepatic or hepatoduodenal ligament lymphadenopathy. No intraperitoneal or retroperitoneal lymphadenopathy. Other: Diffuse edema is identified within the retroperitoneum, mesentery, and body wall. Musculoskeletal: No abnormal marrow enhancement within the visualized bony anatomy. IMPRESSION: 1. 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 There is some soft tissue fullness in the head of pancreas but no discrete or infiltrative mass can be discerned and there is no associated dilatation of the main pancreatic duct. No findings for choledocholithiasis. ERCP/EUS may prove helpful to further evaluate. Follow-up imaging could be performed when patient is better able to cooperate with positioning and breath holding. 2. Moderate right and small left pleural effusion associated with diffuse edema in the abdomen and body wall. 3. Left renal cysts. Electronically Signed   By: Misty Stanley M.D.   On: 05/27/2018  17:31       Today   Subjective:   Shawn Cardenas patient doing much better abdominal pain now resolved  Objective:  Blood pressure 121/84, pulse 90, temperature 97.7 F (36.5 C), temperature source Oral, resp. rate 18, height '5\' 8"'$  (1.727 m), weight 98.1 kg, SpO2 98 %.  .  Intake/Output Summary (Last 24 hours) at 06/06/2018 1339 Last data filed at 06/06/2018 1030 Gross per 24 hour  Intake 240 ml  Output 1950 ml  Net -1710 ml    Exam VITAL SIGNS: Blood pressure 121/84, pulse 90, temperature 97.7 F (36.5 C), temperature source Oral, resp. rate 18, height '5\' 8"'$  (1.727 m), weight 98.1 kg, SpO2 98 %.  GENERAL:  63 y.o.-year-old patient lying in the bed with no acute distress.  EYES: Pupils equal, round, reactive to light and accommodation. No scleral icterus. Extraocular muscles intact.  HEENT: Head atraumatic, normocephalic. Oropharynx and nasopharynx clear.  NECK:  Supple, no jugular venous distention. No thyroid enlargement, no tenderness.  LUNGS: Normal breath sounds bilaterally, no wheezing, rales,rhonchi or crepitation. No use of accessory muscles of respiration.  CARDIOVASCULAR: S1, S2 normal. No murmurs, rubs, or gallops.  ABDOMEN: Soft, nontender, nondistended. Bowel sounds present. No organomegaly or mass.  EXTREMITIES: No pedal edema, cyanosis, or clubbing.  NEUROLOGIC: Cranial nerves II through XII are intact. Muscle strength 5/5 in all extremities. Sensation intact. Gait not checked.  PSYCHIATRIC: The patient is alert and oriented x 3.  SKIN: No obvious rash, lesion, or ulcer.   Data Review     CBC w Diff:  Lab Results  Component Value Date   WBC 8.7 06/04/2018   HGB 11.9 (L) 06/04/2018   HCT 36.5 (L) 06/04/2018   PLT 179 06/04/2018   LYMPHOPCT 15 02/25/2016   MONOPCT 4 02/25/2016   EOSPCT 1 02/25/2016   BASOPCT 0 02/25/2016   CMP:  Lab Results  Component Value Date   NA 138 06/06/2018   K 3.8 06/06/2018   CL 106 06/06/2018   CO2 27 06/06/2018   BUN  29 (H) 06/06/2018   CREATININE 1.38 (H) 06/06/2018   PROT 6.9 06/06/2018   ALBUMIN 2.7 (L) 06/06/2018   BILITOT 2.8 (H) 06/06/2018   ALKPHOS 272 (H) 06/06/2018   AST 35 06/06/2018   ALT 50 (H) 06/06/2018  .  Micro Results No results found for this or any previous visit (from the past 240 hour(s)).      Code Status Orders  (From admission, onward)         Start     Ordered   06/04/18 1103  Limited resuscitation (code)  Continuous    Question Answer Comment  In the event of cardiac or respiratory ARREST: Initiate Code Blue, Call Rapid Response Yes   In the event of cardiac or respiratory ARREST: Perform CPR Yes   In the event of cardiac or respiratory ARREST: Perform Intubation/Mechanical Ventilation No   In the event of cardiac or respiratory ARREST: Use NIPPV/BiPAp only if indicated Yes   In the event of cardiac or respiratory ARREST: Administer ACLS medications if indicated Yes   In the event of cardiac or respiratory ARREST: Perform Defibrillation or Cardioversion if indicated Yes      06/04/18 1102        Code Status History    Date Active Date Inactive Code Status Order ID Comments User Context   06/04/2018 0628 06/04/2018 1102 Full Code [id number]  Harrie Foreman, MD Inpatient   05/23/2018 1616 05/31/2018 2129 Full Code [id number]  Gladstone Lighter, MD Inpatient          Follow-up Information    [name], MD On 06/12/2018.  Specialty:  Family Medicine Why:  @ 11:15 am Contact information: [address] [place] [address] [phone]        [name], MD On 07/02/2018.   Specialty:  Gastroenterology Why:  as scheduled;  @ 3:00 pm Contact information: [address] [place]  [place] [address] [phone]           Discharge Medications   Allergies as of 06/06/2018      Reactions   Aspirin Other (See Comments)   bleeding bleeding ASA contraindicated with bleeding disorder   Other Other (See Comments)   NSAIDS  contraindicated with bleeding disorder.      Medication List    TAKE these medications   Antihemophilic Factor (Recomb) 0301 units Kit Inject 1 kit into the vein every other day. Halixate or Kogenate   cephALEXin 500 MG capsule Commonly known as:  KEFLEX Take 1 capsule by mouth 2 (two) times daily.   DULoxetine 60 MG capsule Commonly known as:  CYMBALTA Take 60 mg by mouth daily.   esomeprazole 40 MG capsule Commonly known as:  NEXIUM Take 40 mg by mouth daily.   FLUoxetine 20 MG capsule Commonly known as:  PROZAC Take 20 mg by mouth daily.   furosemide 20 MG tablet Commonly known as:  LASIX Take 1 tablet (20 mg total) by mouth 2 (two) times daily.   gabapentin 300 MG capsule Commonly known as:  NEURONTIN Take 300 mg by mouth 3 (three) times daily.   glipiZIDE 5 MG tablet Commonly known as:  GLUCOTROL Take 1 tablet (5 mg total) by mouth daily before breakfast.   lisinopril 10 MG tablet Commonly known as:  PRINIVIL,ZESTRIL Take 1 tablet by mouth daily.   NARCAN 4 MG/0.1ML Liqd nasal spray kit Generic drug:  naloxone Place 1 spray into both nostrils every 5 (five) minutes as needed (overdose).   oxyCODONE 10 mg 12 hr tablet Commonly known as:  OXYCONTIN Take 1 tablet (10 mg total) by mouth 2 (two) times daily as needed. What changed:  reasons to take this   oxyCODONE 40 mg 12 hr tablet Commonly known as:  OXYCONTIN Take 1 tablet (40 mg total) by mouth daily. What changed:  Another medication with the same name was changed. Make sure you understand how and when to take each.   pravastatin 40 MG tablet Commonly known as:  PRAVACHOL Take 1 tablet (40 mg total) by mouth daily.   tamsulosin 0.4 MG Caps capsule Commonly known as:  FLOMAX Take 0.4 mg by mouth daily.          Total Time in preparing paper work, data evaluation and todays exam - 71 minutes  Dustin Flock M.D on 06/06/2018 at [facility]  [phone]

## 2018-06-07 ENCOUNTER — Telehealth: Payer: Self-pay

## 2018-06-07 NOTE — Telephone Encounter (Signed)
Call returned to spouse, [name]. Mr. Schaumburg has been discahrged from the hospital. At this time they would like to leave the EUS as already scheduled at [facility], 2/20 with Dr. Mont Dutton. We reviewed the instructions. She was encouraged to call with any questions or needs.

## 2018-06-07 NOTE — Anesthesia Postprocedure Evaluation (Signed)
Anesthesia Post Note  Patient: [name]  Procedure(s) Performed: ENDOSCOPIC RETROGRADE CHOLANGIOPANCREATOGRAPHY (ERCP) (N/A )  Patient location during evaluation: PACU Anesthesia Type: General Level of consciousness: awake and alert and oriented Pain management: pain level controlled Vital Signs Assessment: post-procedure vital signs reviewed and stable Respiratory status: spontaneous breathing Cardiovascular status: blood pressure returned to baseline Anesthetic complications: no     Last Vitals:  Vitals:   06/05/18 2330 06/06/18 0815  BP: 138/89 121/84  Pulse: 98 90  Resp: 19 18  Temp: 36.6 C 36.5 C  SpO2: 99% 98%    Last Pain:  Vitals:   06/06/18 0815  TempSrc: Oral  PainSc: 0-No pain                 Francina Beery

## 2018-06-08 ENCOUNTER — Observation Stay: Payer: Medicare Other

## 2018-06-08 ENCOUNTER — Encounter: Payer: Self-pay | Admitting: Emergency Medicine

## 2018-06-08 ENCOUNTER — Other Ambulatory Visit: Payer: Self-pay

## 2018-06-08 ENCOUNTER — Inpatient Hospital Stay
Admission: EM | Admit: 2018-06-08 | Discharge: 2018-06-11 | DRG: 438 | Disposition: A | Discharge status: Home / Self Care | Payer: Medicare Other | Attending: Internal Medicine | Admitting: Internal Medicine

## 2018-06-08 DIAGNOSIS — F17210 Nicotine dependence, cigarettes, uncomplicated: Secondary | ICD-10-CM | POA: Diagnosis present

## 2018-06-08 DIAGNOSIS — G8929 Other chronic pain: Secondary | ICD-10-CM | POA: Diagnosis present

## 2018-06-08 DIAGNOSIS — R188 Other ascites: Secondary | ICD-10-CM | POA: Diagnosis present

## 2018-06-08 DIAGNOSIS — Z8249 Family history of ischemic heart disease and other diseases of the circulatory system: Secondary | ICD-10-CM

## 2018-06-08 DIAGNOSIS — K219 Gastro-esophageal reflux disease without esophagitis: Secondary | ICD-10-CM | POA: Diagnosis present

## 2018-06-08 DIAGNOSIS — K7460 Unspecified cirrhosis of liver: Secondary | ICD-10-CM | POA: Diagnosis present

## 2018-06-08 DIAGNOSIS — E785 Hyperlipidemia, unspecified: Secondary | ICD-10-CM | POA: Diagnosis present

## 2018-06-08 DIAGNOSIS — R109 Unspecified abdominal pain: Secondary | ICD-10-CM

## 2018-06-08 DIAGNOSIS — D66 Hereditary factor VIII deficiency: Secondary | ICD-10-CM | POA: Diagnosis present

## 2018-06-08 DIAGNOSIS — K859 Acute pancreatitis without necrosis or infection, unspecified: Secondary | ICD-10-CM | POA: Diagnosis not present

## 2018-06-08 DIAGNOSIS — F329 Major depressive disorder, single episode, unspecified: Secondary | ICD-10-CM | POA: Diagnosis present

## 2018-06-08 DIAGNOSIS — N400 Enlarged prostate without lower urinary tract symptoms: Secondary | ICD-10-CM | POA: Diagnosis present

## 2018-06-08 DIAGNOSIS — E119 Type 2 diabetes mellitus without complications: Secondary | ICD-10-CM | POA: Diagnosis present

## 2018-06-08 DIAGNOSIS — Z89611 Acquired absence of right leg above knee: Secondary | ICD-10-CM

## 2018-06-08 LAB — URINALYSIS, COMPLETE (UACMP) WITH MICROSCOPIC
Bacteria, UA: NONE SEEN
Bilirubin Urine: NEGATIVE
Glucose, UA: NEGATIVE mg/dL
Hgb urine dipstick: NEGATIVE
Ketones, ur: NEGATIVE mg/dL
Leukocytes,Ua: NEGATIVE
Nitrite: NEGATIVE
Protein, ur: 100 mg/dL — AB
Specific Gravity, Urine: 1.016 (ref 1.005–1.030)
Squamous Epithelial / HPF: NONE SEEN (ref 0–5)
pH: 6 (ref 5.0–8.0)

## 2018-06-08 LAB — GLUCOSE, CAPILLARY
Glucose-Capillary: 93 mg/dL (ref 70–99)
Glucose-Capillary: 103 mg/dL — ABNORMAL HIGH (ref 70–99)
Glucose-Capillary: 138 mg/dL — ABNORMAL HIGH (ref 70–99)
Glucose-Capillary: 121 mg/dL — ABNORMAL HIGH (ref 70–99)

## 2018-06-08 LAB — CBC
HCT: 33.9 % — ABNORMAL LOW (ref 39.0–52.0)
HCT: 37.4 % — ABNORMAL LOW (ref 39.0–52.0)
Hemoglobin: 11 g/dL — ABNORMAL LOW (ref 13.0–17.0)
Hemoglobin: 11.8 g/dL — ABNORMAL LOW (ref 13.0–17.0)
MCH: 25.6 pg — ABNORMAL LOW (ref 26.0–34.0)
MCH: 26.4 pg (ref 26.0–34.0)
MCHC: 31.6 g/dL (ref 30.0–36.0)
MCHC: 32.4 g/dL (ref 30.0–36.0)
MCV: 81.1 fL (ref 80.0–100.0)
MCV: 81.5 fL (ref 80.0–100.0)
Platelets: 183 10*3/uL (ref 150–400)
Platelets: 193 10*3/uL (ref 150–400)
RBC: 4.16 MIL/uL — ABNORMAL LOW (ref 4.22–5.81)
RBC: 4.61 MIL/uL (ref 4.22–5.81)
RDW: 21.5 % — ABNORMAL HIGH (ref 11.5–15.5)
RDW: 21.5 % — ABNORMAL HIGH (ref 11.5–15.5)
WBC: 7.9 10*3/uL (ref 4.0–10.5)
WBC: 8.5 10*3/uL (ref 4.0–10.5)
nRBC: 0 % (ref 0.0–0.2)
nRBC: 0 % (ref 0.0–0.2)

## 2018-06-08 LAB — COMPREHENSIVE METABOLIC PANEL
ALBUMIN: 2.9 g/dL — AB (ref 3.5–5.0)
ALT: 41 U/L (ref 0–44)
ALT: 36 U/L (ref 0–44)
AST: 37 U/L (ref 15–41)
AST: 28 U/L (ref 15–41)
Albumin: 3.1 g/dL — ABNORMAL LOW (ref 3.5–5.0)
Alkaline Phosphatase: 252 U/L — ABNORMAL HIGH (ref 38–126)
Alkaline Phosphatase: 217 U/L — ABNORMAL HIGH (ref 38–126)
Anion gap: 6 (ref 5–15)
Anion gap: 5 (ref 5–15)
BILIRUBIN TOTAL: 2.6 mg/dL — AB (ref 0.3–1.2)
BUN: 25 mg/dL — ABNORMAL HIGH (ref 8–23)
BUN: 26 mg/dL — ABNORMAL HIGH (ref 8–23)
CO2: 28 mmol/L (ref 22–32)
CO2: 29 mmol/L (ref 22–32)
Calcium: 8.7 mg/dL — ABNORMAL LOW (ref 8.9–10.3)
Calcium: 8.3 mg/dL — ABNORMAL LOW (ref 8.9–10.3)
Chloride: 102 mmol/L (ref 98–111)
Chloride: 104 mmol/L (ref 98–111)
Creatinine, Ser: 1.35 mg/dL — ABNORMAL HIGH (ref 0.61–1.24)
Creatinine, Ser: 1.32 mg/dL — ABNORMAL HIGH (ref 0.61–1.24)
GFR calc Af Amer: >60 mL/min (ref >60)
GFR calc non Af Amer: 55 mL/min — ABNORMAL LOW (ref >60)
GFR calc non Af Amer: 57 mL/min — ABNORMAL LOW (ref >60)
GLUCOSE: 111 mg/dL — AB (ref 70–99)
Glucose, Bld: 152 mg/dL — ABNORMAL HIGH (ref 70–99)
Potassium: 3.9 mmol/L (ref 3.5–5.1)
Potassium: 3.8 mmol/L (ref 3.5–5.1)
Sodium: 137 mmol/L (ref 135–145)
Sodium: 137 mmol/L (ref 135–145)
Total Bilirubin: 2.8 mg/dL — ABNORMAL HIGH (ref 0.3–1.2)
Total Protein: 7 g/dL (ref 6.5–8.1)
Total Protein: 6.4 g/dL — ABNORMAL LOW (ref 6.5–8.1)

## 2018-06-08 LAB — LIPASE, BLOOD: Lipase: 532 U/L — ABNORMAL HIGH (ref 11–51)

## 2018-06-08 MED ORDER — ONDANSETRON HCL 4 MG/2ML IJ SOLN: 4.0000 mg | Freq: Four times a day (QID) | INTRAMUSCULAR | Status: DC | PRN

## 2018-06-08 MED ORDER — TAMSULOSIN HCL 0.4 MG PO CAPS
0.4000 mg | ORAL_CAPSULE | Freq: Every day | ORAL | Status: DC
  Administered 2018-06-08 – 2018-06-11 (×4): 0.4 mg via ORAL
  Filled 2018-06-08 (×4): qty 1

## 2018-06-08 MED ORDER — DULOXETINE HCL 60 MG PO CPEP
60.0000 mg | ORAL_CAPSULE | Freq: Every day | ORAL | Status: DC
  Administered 2018-06-08 – 2018-06-11 (×4): 60 mg via ORAL
  Filled 2018-06-08 (×4): qty 1

## 2018-06-08 MED ORDER — SODIUM CHLORIDE 0.45 % IV SOLN
INTRAVENOUS | Status: DC
  Administered 2018-06-08: 06:00:00 via INTRAVENOUS

## 2018-06-08 MED ORDER — FLEET ENEMA 7-19 GM/118ML RE ENEM: 1.0000 | ENEMA | Freq: Once | RECTAL | Status: DC | PRN

## 2018-06-08 MED ORDER — HYDRALAZINE HCL 20 MG/ML IJ SOLN: 10.0000 mg | INTRAMUSCULAR | Status: DC | PRN

## 2018-06-08 MED ORDER — PANTOPRAZOLE SODIUM 40 MG IV SOLR
40.0000 mg | INTRAVENOUS | Status: DC
  Administered 2018-06-08 – 2018-06-10 (×2): 40 mg via INTRAVENOUS
  Filled 2018-06-08 (×2): qty 40

## 2018-06-08 MED ORDER — ONDANSETRON HCL 4 MG PO TABS: 4.0000 mg | ORAL_TABLET | Freq: Four times a day (QID) | ORAL | Status: DC | PRN

## 2018-06-08 MED ORDER — DOCUSATE SODIUM 100 MG PO CAPS
100.0000 mg | ORAL_CAPSULE | Freq: Two times a day (BID) | ORAL | Status: DC
  Administered 2018-06-11: 100 mg via ORAL
  Filled 2018-06-08 (×5): qty 1

## 2018-06-08 MED ORDER — SODIUM CHLORIDE 0.9 % IV BOLUS
1000.0000 mL | Freq: Once | INTRAVENOUS | Status: AC
  Administered 2018-06-08: 1000 mL via INTRAVENOUS

## 2018-06-08 MED ORDER — INSULIN ASPART 100 UNIT/ML ~~LOC~~ SOLN
0.0000 [IU] | Freq: Three times a day (TID) | SUBCUTANEOUS | Status: DC
  Administered 2018-06-08 – 2018-06-09 (×2): 2 [IU] via SUBCUTANEOUS
  Administered 2018-06-10: 3 [IU] via SUBCUTANEOUS
  Filled 2018-06-08 (×4): qty 1

## 2018-06-08 MED ORDER — MORPHINE SULFATE (PF) 2 MG/ML IV SOLN
2.0000 mg | INTRAVENOUS | Status: DC | PRN
  Administered 2018-06-08: 2 mg via INTRAVENOUS
  Filled 2018-06-08: qty 1

## 2018-06-08 MED ORDER — TEMAZEPAM 15 MG PO CAPS
15.0000 mg | ORAL_CAPSULE | Freq: Every evening | ORAL | Status: DC | PRN
  Filled 2018-06-08: qty 1

## 2018-06-08 MED ORDER — ACETAMINOPHEN 650 MG RE SUPP: 650.0000 mg | Freq: Four times a day (QID) | RECTAL | Status: DC | PRN

## 2018-06-08 MED ORDER — FLUOXETINE HCL 20 MG PO CAPS
20.0000 mg | ORAL_CAPSULE | Freq: Every day | ORAL | Status: DC
  Administered 2018-06-08 – 2018-06-11 (×4): 20 mg via ORAL
  Filled 2018-06-08 (×4): qty 1

## 2018-06-08 MED ORDER — OXYCODONE HCL 5 MG PO TABS
5.0000 mg | ORAL_TABLET | ORAL | Status: DC | PRN
  Administered 2018-06-09 – 2018-06-11 (×2): 5 mg via ORAL
  Filled 2018-06-08 (×3): qty 1

## 2018-06-08 MED ORDER — HYDROMORPHONE HCL 1 MG/ML IJ SOLN
1.0000 mg | INTRAMUSCULAR | Status: DC | PRN
  Administered 2018-06-08 – 2018-06-10 (×10): 1 mg via INTRAVENOUS
  Filled 2018-06-08 (×11): qty 1

## 2018-06-08 MED ORDER — ZOLPIDEM TARTRATE 5 MG PO TABS: 5.0000 mg | ORAL_TABLET | Freq: Every evening | ORAL | Status: DC | PRN

## 2018-06-08 MED ORDER — ENOXAPARIN SODIUM 40 MG/0.4ML ~~LOC~~ SOLN
40.0000 mg | SUBCUTANEOUS | Status: DC
  Administered 2018-06-08 – 2018-06-10 (×2): 40 mg via SUBCUTANEOUS
  Filled 2018-06-08 (×2): qty 0.4

## 2018-06-08 MED ORDER — GABAPENTIN 300 MG PO CAPS
300.0000 mg | ORAL_CAPSULE | Freq: Three times a day (TID) | ORAL | Status: DC
  Administered 2018-06-08 – 2018-06-11 (×8): 300 mg via ORAL
  Filled 2018-06-08 (×9): qty 1

## 2018-06-08 MED ORDER — INSULIN ASPART 100 UNIT/ML ~~LOC~~ SOLN: 0.0000 [IU] | Freq: Every day | SUBCUTANEOUS | Status: DC

## 2018-06-08 MED ORDER — BISACODYL 10 MG RE SUPP: 10.0000 mg | Freq: Every day | RECTAL | Status: DC | PRN

## 2018-06-08 MED ORDER — ACETAMINOPHEN 325 MG PO TABS: 650.0000 mg | ORAL_TABLET | Freq: Four times a day (QID) | ORAL | Status: DC | PRN

## 2018-06-08 MED ORDER — SENNOSIDES-DOCUSATE SODIUM 8.6-50 MG PO TABS: 1.0000 | ORAL_TABLET | Freq: Every evening | ORAL | Status: DC | PRN

## 2018-06-08 MED ORDER — HYDROMORPHONE HCL 1 MG/ML IJ SOLN
1.0000 mg | Freq: Once | INTRAMUSCULAR | Status: AC
  Administered 2018-06-08: 1 mg via INTRAVENOUS
  Filled 2018-06-08: qty 1

## 2018-06-08 NOTE — Progress Notes (Signed)
[facility] [place] [facility]   PATIENT NAME: Shawn Cardenas    MR#:  [id number]  DATE OF BIRTH:  09-Aug-1955  SUBJECTIVE:  CHIEF COMPLAINT:   Chief Complaint  Patient presents with  . Pancreatitis    Patient admitted with acute pancreatitis. Patient initially denied any complaints this morning.  Later complained of having more pains despite PRN morphine.  Changed to PRN Dilaudid.  Seen by gastroenterologist with further recommendations as outlined below.  REVIEW OF SYSTEMS:  Review of Systems  Constitutional: Negative for chills and fever.  HENT: Negative for hearing loss and tinnitus.   Eyes: Negative for blurred vision, double vision and photophobia.  Respiratory: Negative for cough, hemoptysis and shortness of breath.   Cardiovascular: Negative for chest pain and palpitations.  Gastrointestinal: Positive for abdominal pain. Negative for diarrhea, heartburn and nausea.  Genitourinary: Negative for dysuria and urgency.  Musculoskeletal: Negative for myalgias and neck pain.  Skin: Negative for itching and rash.  Neurological: Negative for dizziness and headaches.  Psychiatric/Behavioral: Negative for depression and hallucinations.     DRUG ALLERGIES:   Allergies  Allergen Reactions  . Aspirin Other (See Comments)    bleeding bleeding ASA contraindicated with bleeding disorder  . Other Other (See Comments)    NSAIDS contraindicated with bleeding disorder.   VITALS:  Blood pressure 112/72, pulse 74, temperature (!) 97.4 F (36.3 C), temperature source Oral, resp. rate 19, height 5\' 8"  (1.727 m), weight 104.8 kg, SpO2 98 %. PHYSICAL EXAMINATION:   GENERAL:  63 y.o.-year-old patient lying in the bed with no acute distress.  Obese, nontoxic-appearing EYES: Pupils equal, round, reactive to light and accommodation. No scleral icterus. Extraocular muscles intact.  HEENT: Head atraumatic, normocephalic. Oropharynx and nasopharynx clear.  NECK:   Supple, no jugular venous distention. No thyroid enlargement, no tenderness.  LUNGS: Normal breath sounds bilaterally, no wheezing, rales,rhonchi or crepitation. No use of accessory muscles of respiration.  CARDIOVASCULAR: S1, S2 normal. No murmurs, rubs, or gallops.  ABDOMEN: Soft, distended.  Positive epigastric tenderness but no rebound or guarding at the time of my evaluation.  EXTREMITIES: No pedal edema, cyanosis, or clubbing.  NEUROLOGIC: Cranial nerves II through XII are intact. MAES. Gait not checked.  PSYCHIATRIC: The patient is alert and oriented x 3.  SKIN: No obvious rash, lesion, or ulcer.   LABORATORY PANEL:  Male CBC Recent Labs  Lab 06/08/18 0633  WBC 7.9  HGB 11.0*  HCT 33.9*  PLT 183   ------------------------------------------------------------------------------------------------------------------ Chemistries  Recent Labs  Lab 06/08/18 0633  NA 137  K 3.8  CL 104  CO2 28  GLUCOSE 111*  BUN 26*  CREATININE 1.32*  CALCIUM 8.3*  AST 28  ALT 36  ALKPHOS 217*  BILITOT 2.6*   RADIOLOGY:  No results found. ASSESSMENT AND PLAN:    63 year old male status post discharge from our hospital 2 days prior to admission for acute biliary obstruction status post pancreatic stent placement x2 by gastroenterology, returns with acute worsening abdominal pain  1. Acute recurrent pancreatitis Note: s/p ERCP with stent placement x2 by gastroenterology/Dr. Servando Snare June 04, 2018 Continue IV fluids.  PRN Dilaudid for pain control. Currently n.p.o.  Nursing staff to initiate clear liquid diet after patient has ultrasound with paracentesis done this afternoon. Seen by gastroenterology service.  Appreciate input.  Stated pancreatic enzymes may remain elevated after ERCP.  There was concern about possibility of malignancy as the brushings from 05/29/2018 ERCP was suspicious for malignancy.  Patient  scheduled for endoscopic ultrasound on 06/14/2018 Also presumptive cirrhosis.   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 Gastroenterologist ordered ultrasound-guided paracentesis today for further evaluation  2. Chronic diabetes mellitus type 2 Stable Sliding scale insulin with Accu-Cheks per routine  3. Hypertension Stable Continue home regiment  4. Chronic hemophilia Continue factor VIII replacement per home regiment S/P Mediport placement for easy IV access noted  5. Chronic BPH Stable Continue tamsulosin  6. Hyperlipidemia Stable Continue statin therapy  7. ChronicDepression Stable Continue Prozac  DVT prophylaxis; Lovenox   All the records are reviewed and case discussed with Care Management/Social Worker. Management plans discussed with the patient, family and they are in agreement.  CODE STATUS: Full Code  TOTAL TIME TAKING CARE OF THIS PATIENT: 35 minutes.   More than 50% of the time was spent in counseling/coordination of care: YES  POSSIBLE D/C IN 2 DAYS, DEPENDING ON CLINICAL CONDITION.   Shawn Cardenas M.D on 06/08/2018 at 1:50 PM  Between 7am to 6pm - Pager - [phone]  After 6pm go to [email or web address] - Social research officer, government  [facility]  Office  [phone]  CC: Primary care physician; Shawn Mina, MD  Note: This dictation was prepared with Dragon dictation along with smaller phrase technology. Any transcriptional errors that result from this process are unintentional.

## 2018-06-08 NOTE — ED Notes (Signed)
ED TO INPATIENT HANDOFF REPORT  Name/Age/Gender Shawn Cardenas 63 y.o. male  Code Status Code Status History    Date Active Date Inactive Code Status Order ID Comments User Context   06/04/2018 1102 06/06/2018 1852 Partial Code [id number]  Dustin Flock, MD Inpatient   06/04/2018 [phone] 06/04/2018 1102 Full Code [id number]  Harrie Foreman, MD Inpatient   05/23/2018 1616 05/31/2018 2129 Full Code [id number]  Gladstone Lighter, MD Inpatient    Questions for Most Recent Historical Code Status (Order [id number])    Question Answer Comment   In the event of cardiac or respiratory ARREST: Initiate Code Blue, Call Rapid Response Yes    In the event of cardiac or respiratory ARREST: Perform CPR Yes    In the event of cardiac or respiratory ARREST: Perform Intubation/Mechanical Ventilation No    In the event of cardiac or respiratory ARREST: Use NIPPV/BiPAp only if indicated Yes    In the event of cardiac or respiratory ARREST: Administer ACLS medications if indicated Yes    In the event of cardiac or respiratory ARREST: Perform Defibrillation or Cardioversion if indicated Yes       Home/SNF/Other Home  Chief Complaint Abdominal pain  Level of Care/Admitting Diagnosis ED Disposition    ED Disposition Condition [place]: [place] [100120]  Level of Care: Med-Surg [16]  Diagnosis: Abdominal pain [220254]  Admitting Physician: Gorden Harms [[id number]]  Attending Physician: Gorden Harms [[id number]]  PT Class (Do Not Modify): Observation [104]  PT Acc Code (Do Not Modify): Observation [10022]       Medical History Past Medical History:  Diagnosis Date  . Anxiety   . Arthritis   . Blood transfusion without reported diagnosis   . Clotting disorder ([place])   . Depression   . Diabetes mellitus without complication ([place])   . GERD (gastroesophageal reflux disease)   . Hypertension   . Neuromuscular disorder (HCC)     Allergies Allergies   Allergen Reactions  . Aspirin Other (See Comments)    bleeding bleeding ASA contraindicated with bleeding disorder  . Other Other (See Comments)    NSAIDS contraindicated with bleeding disorder.    IV Location/Drains/Wounds Patient Lines/Drains/Airways Status   Active Line/Drains/Airways    Name:   Placement date:   Placement time:   Site:   Days:   Implanted Port 05/23/18 Left Chest   05/23/18    1448    Chest   16   Implanted Port Left Chest   -    -    Chest             Labs/Imaging Results for orders placed or performed during the hospital encounter of 06/08/18 (from the past 48 hour(s))  Lipase, blood     Status: Abnormal   Collection Time: 06/08/18 12:30 AM  Result Value Ref Range   Lipase 532 (H) 11 - 51 U/L    Comment: RESULT CONFIRMED BY MANUAL DILUTION ttg Performed at [facility], [address] [place], [place] [address]   Comprehensive metabolic panel     Status: Abnormal   Collection Time: 06/08/18 12:30 AM  Result Value Ref Range   Sodium 137 135 - 145 mmol/L   Potassium 3.9 3.5 - 5.1 mmol/L   Chloride 102 98 - 111 mmol/L   CO2 29 22 - 32 mmol/L   Glucose, Bld 152 (H) 70 - 99 mg/dL   BUN 25 (H) 8 - 23 mg/dL  Creatinine, Ser 1.35 (H) 0.61 - 1.24 mg/dL   Calcium 8.7 (L) 8.9 - 10.3 mg/dL   Total Protein 7.0 6.5 - 8.1 g/dL   Albumin 3.1 (L) 3.5 - 5.0 g/dL   AST 37 15 - 41 U/L   ALT 41 0 - 44 U/L   Alkaline Phosphatase 252 (H) 38 - 126 U/L   Total Bilirubin 2.8 (H) 0.3 - 1.2 mg/dL   GFR calc non Af Amer 55 (L) >60 mL/min   GFR calc Af Amer >60 >60 mL/min   Anion gap 6 5 - 15    Comment: Performed at [facility], [address] [place], [place] [address]  CBC     Status: Abnormal   Collection Time: 06/08/18 12:30 AM  Result Value Ref Range   WBC 8.5 4.0 - 10.5 K/uL   RBC 4.61 4.22 - 5.81 MIL/uL   Hemoglobin 11.8 (L) 13.0 - 17.0 g/dL   HCT 37.4 (L) 39.0 - 52.0 %   MCV 81.1 80.0 - 100.0 fL   MCH 25.6 (L) 26.0 - 34.0 pg    MCHC 31.6 30.0 - 36.0 g/dL   RDW 21.5 (H) 11.5 - 15.5 %   Platelets 193 150 - 400 K/uL   nRBC 0.0 0.0 - 0.2 %    Comment: Performed at [facility], [address], [place], [place] [address]   No results found.  Pending Labs Unresulted Labs (From admission, onward)    Start     Ordered   06/08/18 0026  Urinalysis, Complete w Microscopic  ONCE - STAT,   STAT     06/08/18 0026   Signed and Held  CBC  (enoxaparin (LOVENOX)    CrCl >/= 30 ml/min)  Once,   R    Comments:  Baseline for enoxaparin therapy IF NOT ALREADY DRAWN.  Notify MD if PLT < 100 K.    Signed and Held   Signed and Held  Creatinine, serum  (enoxaparin (LOVENOX)    CrCl >/= 30 ml/min)  Once,   R    Comments:  Baseline for enoxaparin therapy IF NOT ALREADY DRAWN.    Signed and Held   Signed and Held  Creatinine, serum  (enoxaparin (LOVENOX)    CrCl >/= 30 ml/min)  Weekly,   R    Comments:  while on enoxaparin therapy    Signed and Held   Signed and Held  Comprehensive metabolic panel  Tomorrow morning,   R     Signed and Held          Vitals/Pain Today's Vitals   06/08/18 0248 06/08/18 0300 06/08/18 0330 06/08/18 0400  BP: 111/71 114/86 106/67 105/80  Pulse: 76 70    Resp: 15     Temp:      TempSrc:      SpO2: 93% 93%    Weight:      Height:      PainSc:        Isolation Precautions No active isolations  Medications Medications  temazepam (RESTORIL) capsule 15 mg (has no administration in time range)  HYDROmorphone (DILAUDID) injection 1 mg (1 mg Intravenous Given 06/08/18 0152)  sodium chloride 0.9 % bolus 1,000 mL (0 mLs Intravenous Stopped 06/08/18 0223)    Mobility power wheelchair

## 2018-06-08 NOTE — Care Management Note (Signed)
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

## 2018-06-08 NOTE — Care Management Obs Status (Signed)
[place] NOTIFICATION   Patient Details  Name: Lorris Carducci MRN: [id number] Date of Birth: [birth year]   Medicare Observation Status Notification Given:  Yes    Su Hilt, RN 06/08/2018, 11:26 AM

## 2018-06-08 NOTE — Plan of Care (Signed)

## 2018-06-08 NOTE — Consult Note (Signed)
[facility] GI Inpatient Consult Note    Keith , M.D.  Reason for Consult: Acute pancreatitis   Attending Requesting Consult: Montell Salary, M.D.  Outpatient Primary Physician: James Hedrick, M.D.  History of Present Illness: Shawn Cardenas is a 62 y.o. 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  Patient also has a history of chronic hepatitis C status post reportedly successful treatment with antiviral therapy.  He does, however, have radiographic evidence of cirrhosis on CT scan obtained on 05/22/2018 with mild ascites also noted. Patient reportedly was initially diagnosed with pancreatitis on or around 05/22/2018 as an adverse effect of Onglyza taken for his diabetes.  This medication has since been removed from his medical regimen.  Patient underwent an MRCP around 05/27/2018 revealing a distal common bile duct stricture with intra-and extrahepatic biliary ductal dilation with common bile duct measuring 9 mm.  Patient underwent an ERCP with common bile duct stent placement on 05/29/2018 with good biliary drainage noted.  Patient was subsequently discharged on or around 05/30/2018 and readmitted on 06/04/2018 for recurrent abdominal pain and biliary duct obstruction.  Patient was then taken again for ERCP on 06/04/2018 at which time the previous biliary stent was removed and 2 new biliary stents were placed across the stricture in the distal common bile duct.  Good drainage was again noted and liver associated enzymes promptly decreased.  Patient did well and ate well and was discharged on 06/07/2018.  Patient ate a pizza that day and had no difficulty.  That evening, however, the patient went to have a bowel movement and experiences severe excruciating pain in the epigastrium and return to the hospital.  He was admitted last evening with acute pancreatitis.  The  patient says he feels better after IV fluids and bowel rest along with pain medication.  He denies any symptoms of vomiting, hematemesis, melena or hematochezia.  Past Medical History:  Past Medical History:  Diagnosis Date  . Anxiety   . Arthritis   . Blood transfusion without reported diagnosis   . Clotting disorder (HCC)   . Depression   . Diabetes mellitus without complication (HCC)   . GERD (gastroesophageal reflux disease)   . Hypertension   . Neuromuscular disorder (HCC)     Problem List: Patient Active Problem List   Diagnosis Date Noted  . Abdominal pain 06/08/2018  . Biliary obstruction 06/04/2018  . Jaundice   . Connective tissue disorder (HCC)   . Obstruction of bile duct   . Acute pancreatitis 05/23/2018  . Coronary artery calcification seen on CT scan 11/16/2017  . Family history of MI (myocardial infarction) 10/19/2015  . Obesity 10/19/2015  . 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 Smoker 10/19/2015  . Hyperlipidemia 10/19/2015  . 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 CHOLECYSTECTOMY    . ERCP N/A 05/29/2018   Procedure: ENDOSCOPIC RETROGRADE CHOLANGIOPANCREATOGRAPHY (ERCP);  Surgeon: Wohl, Darren, MD;  Location: [facility] ENDOSCOPY;  Service: Endoscopy;  Laterality: N/A;  . ERCP N/A 06/04/2018   Procedure: ENDOSCOPIC RETROGRADE CHOLANGIOPANCREATOGRAPHY (ERCP);  Surgeon: Wohl, Darren, MD;  Location: [facility] ENDOSCOPY;  Service: Endoscopy;  Laterality: N/A;  . LEG AMPUTATION Right     Allergies: Allergies  Allergen Reactions  . Aspirin Other (See Comments)    bleeding bleeding ASA contraindicated   with bleeding disorder  . Other Other (See Comments)    NSAIDS contraindicated with bleeding disorder.    Home Medications: Medications Prior to Admission  Medication Sig Dispense Refill Last Dose  . cephALEXin  (KEFLEX) 500 MG capsule Take 1 capsule by mouth 2 (two) times daily.   Past Month at Unknown time  . DULoxetine (CYMBALTA) 60 MG capsule Take 60 mg by mouth daily.    06/07/2018 at Unknown time  . esomeprazole (NEXIUM) 40 MG capsule Take 40 mg by mouth daily.    06/07/2018 at Unknown time  . FLUoxetine (PROZAC) 20 MG capsule Take 20 mg by mouth daily.   06/07/2018 at Unknown time  . furosemide (LASIX) 20 MG tablet Take 1 tablet (20 mg total) by mouth 2 (two) times daily. 60 tablet 0 06/07/2018 at Unknown time  . gabapentin (NEURONTIN) 300 MG capsule Take 300 mg by mouth 3 (three) times daily.    06/07/2018 at Unknown time  . glipiZIDE (GLUCOTROL) 5 MG tablet Take 1 tablet (5 mg total) by mouth daily before breakfast. 30 tablet 0 06/07/2018 at Unknown time  . lisinopril (PRINIVIL,ZESTRIL) 10 MG tablet Take 1 tablet by mouth daily.   06/07/2018 at Unknown time  . oxyCODONE (OXYCONTIN) 10 mg 12 hr tablet Take 1 tablet (10 mg total) by mouth 2 (two) times daily as needed. (Patient taking differently: Take 10 mg by mouth 2 (two) times daily as needed (pain). ) 14 tablet 0 06/07/2018 at Unknown time  . oxyCODONE (OXYCONTIN) 40 mg 12 hr tablet Take 1 tablet (40 mg total) by mouth daily. 30 tablet 0 06/07/2018 at Unknown time  . pravastatin (PRAVACHOL) 40 MG tablet Take 1 tablet (40 mg total) by mouth daily. 30 tablet 3 06/07/2018 at Unknown time  . tamsulosin (FLOMAX) 0.4 MG CAPS capsule Take 0.4 mg by mouth daily.    06/07/2018 at Unknown time  . Antihemophilic Factor, Recomb, 3000 UNITS KIT Inject 1 kit into the vein every other day. Halixate or Kogenate   06/03/2018 at Unknown time  . NARCAN 4 MG/0.1ML LIQD nasal spray kit Place 1 spray into both nostrils every 5 (five) minutes as needed (overdose).    prn at prn   Home medication reconciliation was completed with the patient.   Scheduled Inpatient Medications:   . docusate sodium  100 mg Oral BID  . enoxaparin (LOVENOX) injection  40 mg Subcutaneous Q24H  .  insulin aspart  0-15 Units Subcutaneous TID WC  . insulin aspart  0-5 Units Subcutaneous QHS  . pantoprazole (PROTONIX) IV  40 mg Intravenous Q24H    Continuous Inpatient Infusions:   . sodium chloride 100 mL/hr at 06/08/18 0627    PRN Inpatient Medications:  acetaminophen **OR** acetaminophen, bisacodyl, hydrALAZINE, morphine injection, ondansetron **OR** ondansetron (ZOFRAN) IV, oxyCODONE, senna-docusate, sodium phosphate, temazepam, zolpidem  Family History: family history includes Heart Problems in his mother; Heart attack in his brother, cousin, and father.   GI Family History: Negative  Social History:   reports that he quit smoking about 6 weeks ago. His smoking use included cigarettes. He has a 40.00 pack-year smoking history. He has never used smokeless tobacco. He reports current drug use. Drug: Marijuana. He reports that he does not drink alcohol. The patient denies ETOH, tobacco, or drug use.    Review of Systems: Review of Systems - History obtained from the patient. Negative for Change in vision. Negative for swollen lymph nodes, night sweats, heat intolerance. Negative for chest pain, palpitations, dizziness. Positive   for fatigue and chronic pain related to Hemophilia. No SOB or cough. No fever. Positive for previous Right AKA. Negative for skin changes. Positive for peripheral edema. No dysuria, hematuria. No syncope, stroke or seizures. No memory loss.   Physical Examination: BP 112/72   Pulse 74   Temp (!) 97.4 F (36.3 C) (Oral)   Resp 19   Ht 5' 8" (1.727 m)   Wt 104.8 kg   SpO2 98%   BMI 35.12 kg/m  Physical Exam Constitutional:      General: He is not in acute distress.    Appearance: Normal appearance. He is obese. He is not toxic-appearing or diaphoretic.  HENT:     Head: Normocephalic and atraumatic.     Right Ear: External ear normal.     Nose: Nose normal.     Mouth/Throat:     Mouth: Mucous membranes are moist.     Pharynx: Oropharynx is clear.   Eyes:     General: No scleral icterus.    Extraocular Movements: Extraocular movements intact.     Conjunctiva/sclera: Conjunctivae normal.     Pupils: Pupils are equal, round, and reactive to light.  Neck:     Musculoskeletal: Normal range of motion.  Cardiovascular:     Rate and Rhythm: Normal rate.     Pulses: Normal pulses.     Heart sounds: No murmur. No gallop.   Pulmonary:     Effort: Pulmonary effort is normal. No respiratory distress.     Breath sounds: Rales present. No wheezing.  Abdominal:     General: Bowel sounds are normal. There is distension.     Palpations: There is no mass.     Tenderness: There is abdominal tenderness. There is rebound.     Hernia: No hernia is present.  Neurological:     Mental Status: He is alert.     Data: Lab Results  Component Value Date   WBC 7.9 06/08/2018   HGB 11.0 (L) 06/08/2018   HCT 33.9 (L) 06/08/2018   MCV 81.5 06/08/2018   PLT 183 06/08/2018   Recent Labs  Lab 06/04/18 0010 06/08/18 0030 06/08/18 0633  HGB 11.9* 11.8* 11.0*   Lab Results  Component Value Date   NA 137 06/08/2018   K 3.8 06/08/2018   CL 104 06/08/2018   CO2 28 06/08/2018   BUN 26 (H) 06/08/2018   CREATININE 1.32 (H) 06/08/2018   Lab Results  Component Value Date   ALT 36 06/08/2018   AST 28 06/08/2018   ALKPHOS 217 (H) 06/08/2018   BILITOT 2.6 (H) 06/08/2018   No results for input(s): APTT, INR, PTT in the last 168 hours. CBC Latest Ref Rng & Units 06/08/2018 06/08/2018 06/04/2018  WBC 4.0 - 10.5 K/uL 7.9 8.5 8.7  Hemoglobin 13.0 - 17.0 g/dL 11.0(L) 11.8(L) 11.9(L)  Hematocrit 39.0 - 52.0 % 33.9(L) 37.4(L) 36.5(L)  Platelets 150 - 400 K/uL 183 193 179    STUDIES: No results found. @IMAGES@  Assessment: 1. Acute pancreatitis - Minimal to nil as there is no leukocytosis. Liver associated enzymes continue to normalize. Pancreatic enzymes may remain elevated after ERCP and do not necessarily signify pancreatitis, unless there is pain. I  am concerned in this case, however, that the pain may be related to a malignancy as the brushings from the 05/29/2018 ERCP were "suspicious for malignancy". Patient has an upcoming EUS scheduled on 06/14/2018 with Dr. Burbridge.  2. Presumptive cirrhosis - presumably related to previous chronic hepatitis C, patient   has no alcohol use history.  3. Abdominal pain - pancreatitis, malignancy, SBP, gastritis, PUD in the differential.  4. 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  5.   Hemophilia.  6.  Diabetes.Type II  Recommendations:  1. Begin clear liquid diet today. 2. IVF's , pain control with dilaudid. 3. Ultrasound with dx paracentesis. 4. Acid suppression. 5. Will follow along.  Thank you for the consult. Please call with questions or concerns.  Olean Ree, "Lanny Hurst MD [facility] [place], [place] [address] [phone]  06/08/2018 12:46 PM

## 2018-06-08 NOTE — Progress Notes (Signed)
Family Meeting Note  Advance Directive:yes  Today a meeting took place with the Patient.  Patient is able to participate   The following clinical team members were present during this meeting:MD  The following were discussed:Patient's diagnosis: Pancreatitis, Patient's progosis: Unable to determine and Goals for treatment: Full Code  Additional follow-up to be provided: prn  Time spent during discussion:20 minutes  Gorden Harms, MD

## 2018-06-08 NOTE — ED Provider Notes (Signed)
[facility] Emergency Department Provider Note __________________   First MD Initiated Contact with Patient 06/08/18 0127     (approximate)  I have reviewed the triage vital signs and the nursing notes.   HISTORY  Chief Complaint Pancreatitis    HPI Shawn Cardenas is a 63 y.o. male below list of chronic medical conditions including recent admission for pancreatitis discharged home yesterday returns to the emergency department with 10 out of 10 epigastric abdominal pain with radiation to the back.  Patient denies any nausea or vomiting.  Patient denies any fever.  Patient denies any urinary symptoms.  Patient does admit to inability to tolerate eating and drinking secondary to discomfort.  Past Medical History:  Diagnosis Date  . Anxiety   . Arthritis   . Blood transfusion without reported diagnosis   . Clotting disorder ([place])   . Depression   . Diabetes mellitus without complication ([place])   . GERD (gastroesophageal reflux disease)   . Hypertension   . Neuromuscular disorder [facility])     Patient Active Problem List   Diagnosis Date Noted  . Biliary obstruction 06/04/2018  . Jaundice   . 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 Obstruction of bile duct   . Acute pancreatitis 05/23/2018  . Coronary artery calcification seen on CT scan 11/16/2017  . Family history of MI (myocardial infarction) 10/19/2015  . Obesity 10/19/2015  . 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 Smoker 10/19/2015  . Hyperlipidemia 10/19/2015  . 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 CHOLECYSTECTOMY    . ERCP N/A 05/29/2018   Procedure: ENDOSCOPIC RETROGRADE CHOLANGIOPANCREATOGRAPHY (ERCP);  Surgeon: [name], MD;  Location: [facility] ENDOSCOPY;  Service: Endoscopy;  Laterality: N/A;  . ERCP N/A 06/04/2018   Procedure: ENDOSCOPIC RETROGRADE  CHOLANGIOPANCREATOGRAPHY (ERCP);  Surgeon: [name], MD;  Location: [facility] ENDOSCOPY;  Service: Endoscopy;  Laterality: N/A;  . LEG AMPUTATION Right     Prior to Admission medications   Medication Sig Start Date End Date Taking? Authorizing Provider  Antihemophilic Factor, Recomb, 6606 UNITS KIT Inject 1 kit into the vein every other day. Halixate or Kogenate    [provider]  cephALEXin (KEFLEX) 500 MG capsule Take 1 capsule by mouth 2 (two) times daily. 03/09/17   [provider]  DULoxetine (CYMBALTA) 60 MG capsule Take 60 mg by mouth daily.     [provider]  esomeprazole (NEXIUM) 40 MG capsule Take 40 mg by mouth daily.     [provider]  FLUoxetine (PROZAC) 20 MG capsule Take 20 mg by mouth daily.    [provider]  furosemide (LASIX) 20 MG tablet Take 1 tablet (20 mg total) by mouth 2 (two) times daily. 05/26/18   [name], MD  gabapentin (NEURONTIN) 300 MG capsule Take 300 mg by mouth 3 (three) times daily.     [provider]  glipiZIDE (GLUCOTROL) 5 MG tablet Take 1 tablet (5 mg total) by mouth daily before breakfast. 05/26/18 05/26/19  [name], MD  lisinopril (PRINIVIL,ZESTRIL) 10 MG tablet Take 1 tablet by mouth daily. 03/09/17   [provider]  NARCAN 4 MG/0.1ML LIQD nasal spray kit Place 1 spray into both nostrils every 5 (five) minutes as needed (overdose).  03/14/17   [provider]  oxyCODONE (OXYCONTIN) 10 mg 12 hr tablet Take 1 tablet (10 mg total) by mouth 2 (  two) times daily as needed. Patient taking differently: Take 10 mg by mouth 2 (two) times daily as needed (pain).  05/31/18   [name], MD  oxyCODONE (OXYCONTIN) 40 mg 12 hr tablet Take 1 tablet (40 mg total) by mouth daily. 05/31/18   [name], MD  pravastatin (PRAVACHOL) 40 MG tablet Take 1 tablet (40 mg total) by mouth daily. 02/27/18   Minna Merritts, MD  tamsulosin (FLOMAX) 0.4 MG CAPS capsule  Take 0.4 mg by mouth daily.     [provider]    Allergies Aspirin and Other  Family History  Problem Relation Age of Onset  . Heart Problems Mother   . Heart attack Father   . Heart attack Brother   . Heart attack Cousin     Social History Social History   Tobacco Use  . Smoking status: Former Smoker    Packs/day: 2.00    Years: 20.00    Pack years: 40.00    Types: Cigarettes    Last attempt to quit: 04/24/2018    Years since quitting: 0.1  . Smokeless tobacco: Never Used  Substance Use Topics  . Alcohol use: No  . Drug use: Yes    Types: Marijuana    Review of Systems Constitutional: No fever/chills Eyes: No visual changes. ENT: No sore throat. Cardiovascular: Denies chest pain. Respiratory: Denies shortness of breath. Gastrointestinal: No abdominal pain.  No nausea, no vomiting.  No diarrhea.  No constipation. Genitourinary: Negative for dysuria. Musculoskeletal: Negative for neck pain.  Negative for back pain. Integumentary: Negative for rash. Neurological: Negative for headaches, focal weakness or numbness.  ____________________________________________   PHYSICAL EXAM:  VITAL SIGNS: ED Triage Vitals  Enc Vitals Group     BP 06/08/18 0022 138/85     Pulse Rate 06/08/18 0022 98     Resp 06/08/18 0022 16     Temp 06/08/18 0022 98.6 F (37 C)     Temp Source 06/08/18 0022 Oral     SpO2 06/08/18 0022 99 %     Weight 06/08/18 0023 104.3 kg (230 lb)     Height 06/08/18 0023 1.727 m (_0 )     Head Circumference --      Peak Flow --      Pain Score 06/08/18 0023 8     Pain Loc --      Pain Edu? --      Excl. in [place]? --     Constitutional: Alert and oriented. Well appearing and in no acute distress. Eyes: Conjunctivae are normal. PERRL. EOMI. Mouth/Throat: Mucous membranes are moist. Neck: No stridor.   Cardiovascular: Normal rate, regular rhythm. Good peripheral circulation. Grossly normal heart sounds. Respiratory: Normal respiratory  effort.  No retractions. Lungs CTAB. Gastrointestinal: Epigastric tenderness to palpation.. No distention.  Musculoskeletal: No lower extremity tenderness nor edema.  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 No gross focal neurologic deficits are appreciated.  Skin:  Skin is warm, dry and intact. No rash noted. Psychiatric: Mood and affect are normal. Speech and behavior are normal.  ____________________________________________   LABS (all labs ordered are listed, but only abnormal results are displayed)  Labs Reviewed  LIPASE, BLOOD - Abnormal; Notable for the following components:      Result Value   Lipase 532 (*)    All other components within normal limits  COMPREHENSIVE METABOLIC PANEL - Abnormal; Notable for the following components:   Glucose, Bld 152 (*)  BUN 25 (*)    Creatinine, Ser 1.35 (*)    Calcium 8.7 (*)    Albumin 3.1 (*)    Alkaline Phosphatase 252 (*)    Total Bilirubin 2.8 (*)    GFR calc non Af Amer 55 (*)    All other components within normal limits  CBC - Abnormal; Notable for the following components:   Hemoglobin 11.8 (*)    HCT 37.4 (*)    MCH 25.6 (*)    RDW 21.5 (*)    All other components within normal limits  URINALYSIS, COMPLETE (UACMP) WITH MICROSCOPIC     Procedures   ____________________________________________   INITIAL IMPRESSION / ASSESSMENT AND PLAN / ED COURSE  As part of my medical decision making, I reviewed the following data within the electronic MEDICAL RECORD NUMBER   63 year old male presented with above-stated history and physical exam consistent with acute pancreatitis which was confirmed with lipase of 532.  Patient received IV Dilaudid 1 mg of minimal improvement of pain on reassessment patient states pain is 8 out of 10.  As such patient received an additional milligram of Dilaudid.  Patient discussed with Dr. Jerelyn Charles hospitalist for hospital admission further evaluation and management for  acute pancreatitis     ____________________________________________  FINAL CLINICAL IMPRESSION(S) / ED DIAGNOSES  Final diagnoses:  Acute pancreatitis, unspecified complication status, unspecified pancreatitis type     MEDICATIONS GIVEN DURING THIS VISIT:  Medications  sodium chloride 0.9 % bolus 1,000 mL (1,000 mLs Intravenous New Bag/Given 06/08/18 0152)  HYDROmorphone (DILAUDID) injection 1 mg (1 mg Intravenous Given 06/08/18 0152)     ED Discharge Orders    None       Note:  This document was prepared using Dragon voice recognition software and may include unintentional dictation errors.   Gregor Hams, MD 06/08/18 [phone]

## 2018-06-08 NOTE — Progress Notes (Signed)
Patient brought to radiology for paracentesis.  Limited Abd US shows trace amount of fluid not amenable to aspiration.  Patient returned to unit.  No procedure performed.   MD made aware.  Brynda Greathouse, MS RD PA-C 3:00 PM

## 2018-06-08 NOTE — H&P (View-Only) (Signed)
[facility] [place] NAME: Shawn Cardenas    MR#:  [id number]  DATE OF BIRTH:  [birth year]  DATE OF ADMISSION:  06/08/2018  PRIMARY CARE PHYSICIAN: [name], MD   REQUESTING/REFERRING PHYSICIAN:   CHIEF COMPLAINT:   Chief Complaint  Patient presents with  . Pancreatitis    HISTORY OF PRESENT ILLNESS: Shawn Cardenas  is a 63 y.o. male with a known history of hypertension, diabetes, and hemophilia s/p Mediport in place, recent hospital discharge 2 days ago for acute biliary obstruction status post pancreatic duct stent placement x2, returns to the emergency room with acute abdominal pain similar to previous bouts of pancreatitis, in the emergency room patient was found to have lipase greater than 500, total bili 2.8 which is stable compared to 2 days ago, creatinine 1.3, AST/ALT are not impressive, patient evaluated in the emergency room, resting comfortably in bed, no apparent distress, patient is now being admitted for acute recurrent pancreatitis.  PAST MEDICAL HISTORY:   Past Medical History:  Diagnosis Date  . Anxiety   . Arthritis   . Blood transfusion without reported diagnosis   . Clotting disorder ([place])   . Depression   . Diabetes mellitus without complication ([place])   . GERD (gastroesophageal reflux disease)   . Hypertension   . Neuromuscular disorder ([place])     PAST SURGICAL HISTORY:  Past Surgical History:  Procedure Laterality Date  . CHOLECYSTECTOMY    . ERCP N/A 05/29/2018   Procedure: ENDOSCOPIC RETROGRADE CHOLANGIOPANCREATOGRAPHY (ERCP);  Surgeon: [name], MD;  Location: [facility] ENDOSCOPY;  Service: Endoscopy;  Laterality: N/A;  . ERCP N/A 06/04/2018   Procedure: ENDOSCOPIC RETROGRADE CHOLANGIOPANCREATOGRAPHY (ERCP);  Surgeon: [name], MD;  Location: [facility] ENDOSCOPY;  Service: Endoscopy;  Laterality: N/A;  . LEG AMPUTATION Right     SOCIAL HISTORY:  Social History   Tobacco Use  . Smoking status: Former Smoker    Packs/day: 2.00    Years: 20.00    Pack years: 40.00    Types: Cigarettes    Last attempt to quit: 04/24/2018    Years since quitting: 0.1  . Smokeless tobacco: Never Used  Substance Use Topics  . Alcohol use: No    FAMILY HISTORY:  Family History  Problem Relation Age of Onset  . Heart Problems Mother   . Heart attack Father   . Heart attack Brother   . Heart attack Cousin     DRUG ALLERGIES:  Allergies  Allergen Reactions  . Aspirin Other (See Comments)    bleeding bleeding ASA contraindicated with bleeding disorder  . Other Other (See Comments)    NSAIDS contraindicated with bleeding disorder.    REVIEW OF SYSTEMS:   CONSTITUTIONAL: No fever, +fatigue, weakness.  EYES: No blurred or double vision.  EARS, NOSE, AND THROAT: No tinnitus or ear pain.  RESPIRATORY: No cough, shortness of breath, wheezing or hemoptysis.  CARDIOVASCULAR: No chest pain, orthopnea, edema.  GASTROINTESTINAL: + nausea, abdominal pain.  GENITOURINARY: No dysuria, hematuria.  ENDOCRINE: No polyuria, nocturia,  HEMATOLOGY: No anemia, easy bruising or bleeding SKIN: No rash or lesion. MUSCULOSKELETAL: No joint pain or arthritis.   NEUROLOGIC: No tingling, numbness, weakness.  PSYCHIATRY: No anxiety or depression.   MEDICATIONS AT HOME:  Prior to Admission medications   Medication Sig Start Date End Date Taking? Authorizing Provider  cephALEXin (KEFLEX) 500 MG capsule Take 1 capsule by mouth 2 (two) times daily. 03/09/17  Yes [provider]  DULoxetine (CYMBALTA) 60  MG capsule Take 60 mg by mouth daily.    Yes [provider]  esomeprazole (NEXIUM) 40 MG capsule Take 40 mg by mouth daily.    Yes [provider]  FLUoxetine (PROZAC) 20 MG capsule Take 20 mg by mouth daily.   Yes [provider]  furosemide (LASIX) 20 MG tablet Take 1 tablet (20 mg total) by mouth 2 (two) times daily. 05/26/18  Yes [name], MD  gabapentin (NEURONTIN) 300 MG  capsule Take 300 mg by mouth 3 (three) times daily.    Yes [provider]  glipiZIDE (GLUCOTROL) 5 MG tablet Take 1 tablet (5 mg total) by mouth daily before breakfast. 05/26/18 05/26/19 Yes [name], MD  lisinopril (PRINIVIL,ZESTRIL) 10 MG tablet Take 1 tablet by mouth daily. 03/09/17  Yes [provider]  oxyCODONE (OXYCONTIN) 10 mg 12 hr tablet Take 1 tablet (10 mg total) by mouth 2 (two) times daily as needed. Patient taking differently: Take 10 mg by mouth 2 (two) times daily as needed (pain).  05/31/18  Yes [name], MD  oxyCODONE (OXYCONTIN) 40 mg 12 hr tablet Take 1 tablet (40 mg total) by mouth daily. 05/31/18  Yes [name], MD  pravastatin (PRAVACHOL) 40 MG tablet Take 1 tablet (40 mg total) by mouth daily. 02/27/18  Yes Gollan, Kathlene November, MD  tamsulosin (FLOMAX) 0.4 MG CAPS capsule Take 0.4 mg by mouth daily.    Yes [provider]  Antihemophilic Factor, Recomb, 3244 UNITS KIT Inject 1 kit into the vein every other day. Halixate or Kogenate    [provider]  NARCAN 4 MG/0.1ML LIQD nasal spray kit Place 1 spray into both nostrils every 5 (five) minutes as needed (overdose).  03/14/17   [provider]      PHYSICAL EXAMINATION:   VITAL SIGNS: Blood pressure 138/85, pulse 98, temperature 98.6 F (37 C), temperature source Oral, resp. rate 16, height '5\' 8"'$  (1.727 m), weight 104.3 kg, SpO2 99 %.  GENERAL:  63 y.o.-year-old patient lying in the bed with no acute distress.  Obese, nontoxic-appearing EYES: Pupils equal, round, reactive to light and accommodation. No scleral icterus. Extraocular muscles intact.  HEENT: Head atraumatic, normocephalic. Oropharynx and nasopharynx clear.  NECK:  Supple, no jugular venous distention. No thyroid enlargement, no tenderness.  LUNGS: Normal breath sounds bilaterally, no wheezing, rales,rhonchi or crepitation. No use of accessory muscles of respiration.  CARDIOVASCULAR: S1,  S2 normal. No murmurs, rubs, or gallops.  ABDOMEN: Soft, nontender, nondistended. Bowel sounds present. No organomegaly or mass.  EXTREMITIES: No pedal edema, cyanosis, or clubbing.  NEUROLOGIC: Cranial nerves II through XII are intact. MAES. Gait not checked.  PSYCHIATRIC: The patient is alert and oriented x 3.  SKIN: No obvious rash, lesion, or ulcer.   LABORATORY PANEL:   CBC Recent Labs  Lab 06/04/18 0010 06/08/18 0030  WBC 8.7 8.5  HGB 11.9* 11.8*  HCT 36.5* 37.4*  PLT 179 193  MCV 78.3* 81.1  MCH 25.5* 25.6*  MCHC 32.6 31.6  RDW 20.3* 21.5*   ------------------------------------------------------------------------------------------------------------------  Chemistries  Recent Labs  Lab 06/04/18 0010 06/05/18 1049 06/06/18 0630 06/08/18 0030  NA 134* 136 138 137  K 4.1 4.0 3.8 3.9  CL 100 102 106 102  CO2 '27 26 27 29  '$ GLUCOSE 160* 234* 193* 152*  BUN 21 23 29* 25*  CREATININE 1.38* 1.43* 1.38* 1.35*  CALCIUM 8.9 8.5* 8.4* 8.7*  AST 93* 52* 35 37  ALT 80* 58* 50* 41  ALKPHOS 359* 299* 272* 252*  BILITOT 8.6* 3.7* 2.8* 2.8*   ------------------------------------------------------------------------------------------------------------------ estimated creatinine clearance is 65.6 mL/min (A) (by C-G formula based on SCr of 1.35 mg/dL (H)). ------------------------------------------------------------------------------------------------------------------ No results for input(s): TSH, T4TOTAL, T3FREE, THYROIDAB in the last 72 hours.  Invalid input(s): FREET3   Coagulation profile No results for input(s): INR, PROTIME in the last 168 hours. ------------------------------------------------------------------------------------------------------------------- No results for input(s): DDIMER in the last 72 hours. -------------------------------------------------------------------------------------------------------------------  Cardiac Enzymes Recent Labs  Lab  06/04/18 0641 06/04/18 1229 06/04/18 1830  TROPONINI 0.03* <0.03 0.03*   ------------------------------------------------------------------------------------------------------------------ Invalid input(s): POCBNP  ---------------------------------------------------------------------------------------------------------------  Urinalysis    Component Value Date/Time   COLORURINE AMBER (A) 06/04/2018 0616   APPEARANCEUR CLEAR (A) 06/04/2018 0616   LABSPEC 1.004 (L) 06/04/2018 0616   PHURINE 6.0 06/04/2018 0616   GLUCOSEU NEGATIVE 06/04/2018 0616   HGBUR SMALL (A) 06/04/2018 0616   BILIRUBINUR NEGATIVE 06/04/2018 0616   Ignacio 06/04/2018 0616   PROTEINUR 100 (A) 06/04/2018 0616   NITRITE NEGATIVE 06/04/2018 0616   LEUKOCYTESUR NEGATIVE 06/04/2018 0616     RADIOLOGY: No results found.  EKG: Orders placed or performed during the hospital encounter of 06/04/18  . ED EKG  . ED EKG  . EKG 12-Lead  . EKG 12-Lead  . EKG    IMPRESSION AND PLAN: 63 year old male status post discharge from our hospital 2 days ago for acute biliary obstruction status post pancreatic stent placement x2 by gastroenterology, returns with acute worsening abdominal pain  *Acute recurrent pancreatitis Note: s/p ERCP with stent placement x2 by gastroenterology/Dr. Allen Norris June 04, 2018 Admit to regular nursing for bed on the observation unit, IV fluids for rehydration, n.p.o. 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 Management plans discussed with the patient, family and they are in agreement.  CODE STATUS:full Code Status History    Date Active Date Inactive Code Status Order ID Comments User Context   06/04/2018 1102 06/06/2018 1852 Partial Code [id number]  Dustin Flock, MD Inpatient   06/04/2018 [id number] 06/04/2018 1102 Full Code [id number]  Harrie Foreman, MD Inpatient   05/23/2018 1616 05/31/2018 2129 Full Code [id number]  Gladstone Lighter, MD Inpatient    Questions for Most Recent Historical Code Status (Order [id number])    Question Answer Comment   In the event of cardiac or respiratory ARREST: Initiate Code Blue, Call Rapid Response Yes    In the event of cardiac or respiratory ARREST: Perform CPR Yes    In the event of cardiac or respiratory ARREST: Perform Intubation/Mechanical Ventilation No    In the event of cardiac or respiratory ARREST: Use NIPPV/BiPAp only if indicated Yes    In the event of cardiac or respiratory ARREST: Administer ACLS medications if indicated Yes    In the event of cardiac or respiratory ARREST: Perform Defibrillation or Cardioversion if indicated Yes        TOTAL TIME TAKING CARE OF THIS PATIENT: 40 minutes.    Avel Peace Shourya Macpherson M.D on 06/08/2018   Between 7am to  6pm - Pager - [phone]  After 6pm go to [email or web address] - password [email or web address] [facility]  Office  [phone]  CC: Primary care physician; [name], MD   Note: This dictation was prepared with Dragon dictation along with smaller phrase technology. Any transcriptional errors that result from this process are unintentional.

## 2018-06-08 NOTE — Progress Notes (Signed)
Pnt called RN into the room at approx 2000 requesting a room with a larger bathroom. Did try to accommodate however only room with a larger bathroom is Room 40 and there is a patient in the room. Once the room becomes available, if pnt is still here we can move him.   Pnt requesting pain medication at approx 2300. Offered Oxycodone and pnts sleeping medication however pnt became very agitated and told me he only wants his Dilaudid as nothing else works for him. I offered to come back to room in 45 mins after administering his Oxycodone to see if he still would like Dilaudid. Educated on importance of splitting up narcotics usually 45-60 mins apart. Pnt refused and told me he wanted to leave the hospital and go to [facility] because "you guys don't know what we are doing here". Pnt demanded that I get out of his room.   Spoke to [employer], Agricultural consultant this evening, and asked her to speak to the pnt.

## 2018-06-08 NOTE — Progress Notes (Signed)
Patient has had no urine output since admission this morning, bladder scan reveals 66ml.  Patient is trying to void.

## 2018-06-08 NOTE — H&P (Signed)
[facility] [place] NAME: Shawn Cardenas    MR#:  [id number]  DATE OF BIRTH:  [birth year]  DATE OF ADMISSION:  06/08/2018  PRIMARY CARE PHYSICIAN: [name], MD   REQUESTING/REFERRING PHYSICIAN:   CHIEF COMPLAINT:   Chief Complaint  Patient presents with  . Pancreatitis    HISTORY OF PRESENT ILLNESS: Shawn Cardenas  is a 63 y.o. male with a known history of hypertension, diabetes, and hemophilia s/p Mediport in place, recent hospital discharge 2 days ago for acute biliary obstruction status post pancreatic duct stent placement x2, returns to the emergency room with acute abdominal pain similar to previous bouts of pancreatitis, in the emergency room patient was found to have lipase greater than 500, total bili 2.8 which is stable compared to 2 days ago, creatinine 1.3, AST/ALT are not impressive, patient evaluated in the emergency room, resting comfortably in bed, no apparent distress, patient is now being admitted for acute recurrent pancreatitis.  PAST MEDICAL HISTORY:   Past Medical History:  Diagnosis Date  . Anxiety   . Arthritis   . Blood transfusion without reported diagnosis   . Clotting disorder ([place])   . Depression   . Diabetes mellitus without complication ([place])   . GERD (gastroesophageal reflux disease)   . Hypertension   . Neuromuscular disorder ([place])     PAST SURGICAL HISTORY:  Past Surgical History:  Procedure Laterality Date  . CHOLECYSTECTOMY    . ERCP N/A 05/29/2018   Procedure: ENDOSCOPIC RETROGRADE CHOLANGIOPANCREATOGRAPHY (ERCP);  Surgeon: [name], MD;  Location: [facility] ENDOSCOPY;  Service: Endoscopy;  Laterality: N/A;  . ERCP N/A 06/04/2018   Procedure: ENDOSCOPIC RETROGRADE CHOLANGIOPANCREATOGRAPHY (ERCP);  Surgeon: [name], MD;  Location: [facility] ENDOSCOPY;  Service: Endoscopy;  Laterality: N/A;  . LEG AMPUTATION Right     SOCIAL HISTORY:  Social History   Tobacco Use  . Smoking status: Former Smoker    Packs/day: 2.00    Years: 20.00    Pack years: 40.00    Types: Cigarettes    Last attempt to quit: 04/24/2018    Years since quitting: 0.1  . Smokeless tobacco: Never Used  Substance Use Topics  . Alcohol use: No    FAMILY HISTORY:  Family History  Problem Relation Age of Onset  . Heart Problems Mother   . Heart attack Father   . Heart attack Brother   . Heart attack Cousin     DRUG ALLERGIES:  Allergies  Allergen Reactions  . Aspirin Other (See Comments)    bleeding bleeding ASA contraindicated with bleeding disorder  . Other Other (See Comments)    NSAIDS contraindicated with bleeding disorder.    REVIEW OF SYSTEMS:   CONSTITUTIONAL: No fever, +fatigue, weakness.  EYES: No blurred or double vision.  EARS, NOSE, AND THROAT: No tinnitus or ear pain.  RESPIRATORY: No cough, shortness of breath, wheezing or hemoptysis.  CARDIOVASCULAR: No chest pain, orthopnea, edema.  GASTROINTESTINAL: + nausea, abdominal pain.  GENITOURINARY: No dysuria, hematuria.  ENDOCRINE: No polyuria, nocturia,  HEMATOLOGY: No anemia, easy bruising or bleeding SKIN: No rash or lesion. MUSCULOSKELETAL: No joint pain or arthritis.   NEUROLOGIC: No tingling, numbness, weakness.  PSYCHIATRY: No anxiety or depression.   MEDICATIONS AT HOME:  Prior to Admission medications   Medication Sig Start Date End Date Taking? Authorizing Provider  cephALEXin (KEFLEX) 500 MG capsule Take 1 capsule by mouth 2 (two) times daily. 03/09/17  Yes [provider]  DULoxetine (CYMBALTA) 60  MG capsule Take 60 mg by mouth daily.    Yes [provider]  esomeprazole (NEXIUM) 40 MG capsule Take 40 mg by mouth daily.    Yes [provider]  FLUoxetine (PROZAC) 20 MG capsule Take 20 mg by mouth daily.   Yes [provider]  furosemide (LASIX) 20 MG tablet Take 1 tablet (20 mg total) by mouth 2 (two) times daily. 05/26/18  Yes [name], MD  gabapentin (NEURONTIN) 300 MG  capsule Take 300 mg by mouth 3 (three) times daily.    Yes [provider]  glipiZIDE (GLUCOTROL) 5 MG tablet Take 1 tablet (5 mg total) by mouth daily before breakfast. 05/26/18 05/26/19 Yes [name], MD  lisinopril (PRINIVIL,ZESTRIL) 10 MG tablet Take 1 tablet by mouth daily. 03/09/17  Yes [provider]  oxyCODONE (OXYCONTIN) 10 mg 12 hr tablet Take 1 tablet (10 mg total) by mouth 2 (two) times daily as needed. Patient taking differently: Take 10 mg by mouth 2 (two) times daily as needed (pain).  05/31/18  Yes [name], MD  oxyCODONE (OXYCONTIN) 40 mg 12 hr tablet Take 1 tablet (40 mg total) by mouth daily. 05/31/18  Yes [name], MD  pravastatin (PRAVACHOL) 40 MG tablet Take 1 tablet (40 mg total) by mouth daily. 02/27/18  Yes Gollan, Kathlene November, MD  tamsulosin (FLOMAX) 0.4 MG CAPS capsule Take 0.4 mg by mouth daily.    Yes [provider]  Antihemophilic Factor, Recomb, 3244 UNITS KIT Inject 1 kit into the vein every other day. Halixate or Kogenate    [provider]  NARCAN 4 MG/0.1ML LIQD nasal spray kit Place 1 spray into both nostrils every 5 (five) minutes as needed (overdose).  03/14/17   [provider]      PHYSICAL EXAMINATION:   VITAL SIGNS: Blood pressure 138/85, pulse 98, temperature 98.6 F (37 C), temperature source Oral, resp. rate 16, height '5\' 8"'$  (1.727 m), weight 104.3 kg, SpO2 99 %.  GENERAL:  63 y.o.-year-old patient lying in the bed with no acute distress.  Obese, nontoxic-appearing EYES: Pupils equal, round, reactive to light and accommodation. No scleral icterus. Extraocular muscles intact.  HEENT: Head atraumatic, normocephalic. Oropharynx and nasopharynx clear.  NECK:  Supple, no jugular venous distention. No thyroid enlargement, no tenderness.  LUNGS: Normal breath sounds bilaterally, no wheezing, rales,rhonchi or crepitation. No use of accessory muscles of respiration.  CARDIOVASCULAR: S1,  S2 normal. No murmurs, rubs, or gallops.  ABDOMEN: Soft, nontender, nondistended. Bowel sounds present. No organomegaly or mass.  EXTREMITIES: No pedal edema, cyanosis, or clubbing.  NEUROLOGIC: Cranial nerves II through XII are intact. MAES. Gait not checked.  PSYCHIATRIC: The patient is alert and oriented x 3.  SKIN: No obvious rash, lesion, or ulcer.   LABORATORY PANEL:   CBC Recent Labs  Lab 06/04/18 0010 06/08/18 0030  WBC 8.7 8.5  HGB 11.9* 11.8*  HCT 36.5* 37.4*  PLT 179 193  MCV 78.3* 81.1  MCH 25.5* 25.6*  MCHC 32.6 31.6  RDW 20.3* 21.5*   ------------------------------------------------------------------------------------------------------------------  Chemistries  Recent Labs  Lab 06/04/18 0010 06/05/18 1049 06/06/18 0630 06/08/18 0030  NA 134* 136 138 137  K 4.1 4.0 3.8 3.9  CL 100 102 106 102  CO2 '27 26 27 29  '$ GLUCOSE 160* 234* 193* 152*  BUN 21 23 29* 25*  CREATININE 1.38* 1.43* 1.38* 1.35*  CALCIUM 8.9 8.5* 8.4* 8.7*  AST 93* 52* 35 37  ALT 80* 58* 50* 41  ALKPHOS 359* 299* 272* 252*  BILITOT 8.6* 3.7* 2.8* 2.8*   ------------------------------------------------------------------------------------------------------------------ estimated creatinine clearance is 65.6 mL/min (A) (by C-G formula based on SCr of 1.35 mg/dL (H)). ------------------------------------------------------------------------------------------------------------------ No results for input(s): TSH, T4TOTAL, T3FREE, THYROIDAB in the last 72 hours.  Invalid input(s): FREET3   Coagulation profile No results for input(s): INR, PROTIME in the last 168 hours. ------------------------------------------------------------------------------------------------------------------- No results for input(s): DDIMER in the last 72 hours. -------------------------------------------------------------------------------------------------------------------  Cardiac Enzymes Recent Labs  Lab  06/04/18 0641 06/04/18 1229 06/04/18 1830  TROPONINI 0.03* <0.03 0.03*   ------------------------------------------------------------------------------------------------------------------ Invalid input(s): POCBNP  ---------------------------------------------------------------------------------------------------------------  Urinalysis    Component Value Date/Time   COLORURINE AMBER (A) 06/04/2018 0616   APPEARANCEUR CLEAR (A) 06/04/2018 0616   LABSPEC 1.004 (L) 06/04/2018 0616   PHURINE 6.0 06/04/2018 0616   GLUCOSEU NEGATIVE 06/04/2018 0616   HGBUR SMALL (A) 06/04/2018 0616   BILIRUBINUR NEGATIVE 06/04/2018 0616   Ignacio 06/04/2018 0616   PROTEINUR 100 (A) 06/04/2018 0616   NITRITE NEGATIVE 06/04/2018 0616   LEUKOCYTESUR NEGATIVE 06/04/2018 0616     RADIOLOGY: No results found.  EKG: Orders placed or performed during the hospital encounter of 06/04/18  . ED EKG  . ED EKG  . EKG 12-Lead  . EKG 12-Lead  . EKG    IMPRESSION AND PLAN: 63 year old male status post discharge from our hospital 2 days ago for acute biliary obstruction status post pancreatic stent placement x2 by gastroenterology, returns with acute worsening abdominal pain  *Acute recurrent pancreatitis Note: s/p ERCP with stent placement x2 by gastroenterology/Dr. Allen Norris June 04, 2018 Admit to regular nursing for bed on the observation unit, IV fluids for rehydration, n.p.o. 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 Management plans discussed with the patient, family and they are in agreement.  CODE STATUS:full Code Status History    Date Active Date Inactive Code Status Order ID Comments User Context   06/04/2018 1102 06/06/2018 1852 Partial Code [id number]  Dustin Flock, MD Inpatient   06/04/2018 [id number] 06/04/2018 1102 Full Code [id number]  Harrie Foreman, MD Inpatient   05/23/2018 1616 05/31/2018 2129 Full Code [id number]  Gladstone Lighter, MD Inpatient    Questions for Most Recent Historical Code Status (Order [id number])    Question Answer Comment   In the event of cardiac or respiratory ARREST: Initiate Code Blue, Call Rapid Response Yes    In the event of cardiac or respiratory ARREST: Perform CPR Yes    In the event of cardiac or respiratory ARREST: Perform Intubation/Mechanical Ventilation No    In the event of cardiac or respiratory ARREST: Use NIPPV/BiPAp only if indicated Yes    In the event of cardiac or respiratory ARREST: Administer ACLS medications if indicated Yes    In the event of cardiac or respiratory ARREST: Perform Defibrillation or Cardioversion if indicated Yes        TOTAL TIME TAKING CARE OF THIS PATIENT: 40 minutes.    Avel Peace Salary M.D on 06/08/2018   Between 7am to  6pm - Pager - [phone]  After 6pm go to [email or web address] - password [email or web address] [facility]  Office  [phone]  CC: Primary care physician; [name], MD   Note: This dictation was prepared with Dragon dictation along with smaller phrase technology. Any transcriptional errors that result from this process are unintentional.

## 2018-06-08 NOTE — Plan of Care (Signed)
  Problem: Pain Managment: Goal: General experience of comfort will improve Outcome: Progressing   Problem: Safety: Goal: Ability to remain free from injury will improve Outcome: Progressing   

## 2018-06-08 NOTE — ED Triage Notes (Signed)
Pt in via POV, reports recently diagnosed with pancreatitis with multiped stents placed.  Pt reports similar pain has returned, denies any N/V/D.  Vitals WDL, NAD noted at this time.

## 2018-06-08 NOTE — Progress Notes (Signed)
Patient had urine output of 766ml at 1555

## 2018-06-09 LAB — CBC
HCT: 34.2 % — ABNORMAL LOW (ref 39.0–52.0)
HEMOGLOBIN: 10.7 g/dL — AB (ref 13.0–17.0)
MCH: 25.7 pg — ABNORMAL LOW (ref 26.0–34.0)
MCHC: 31.3 g/dL (ref 30.0–36.0)
MCV: 82.2 fL (ref 80.0–100.0)
Platelets: 171 10*3/uL (ref 150–400)
RBC: 4.16 MIL/uL — ABNORMAL LOW (ref 4.22–5.81)
RDW: 21.3 % — ABNORMAL HIGH (ref 11.5–15.5)
WBC: 6.9 10*3/uL (ref 4.0–10.5)
nRBC: 0 % (ref 0.0–0.2)

## 2018-06-09 LAB — COMPREHENSIVE METABOLIC PANEL
ALK PHOS: 223 U/L — AB (ref 38–126)
ALT: 31 U/L (ref 0–44)
AST: 30 U/L (ref 15–41)
Albumin: 2.8 g/dL — ABNORMAL LOW (ref 3.5–5.0)
Anion gap: 4 — ABNORMAL LOW (ref 5–15)
BUN: 23 mg/dL (ref 8–23)
CALCIUM: 8.3 mg/dL — AB (ref 8.9–10.3)
CO2: 29 mmol/L (ref 22–32)
Chloride: 102 mmol/L (ref 98–111)
Creatinine, Ser: 1.23 mg/dL (ref 0.61–1.24)
GFR calc Af Amer: >60 mL/min (ref >60)
GFR calc non Af Amer: >60 mL/min (ref >60)
Glucose, Bld: 112 mg/dL — ABNORMAL HIGH (ref 70–99)
Potassium: 3.9 mmol/L (ref 3.5–5.1)
Sodium: 135 mmol/L (ref 135–145)
Total Bilirubin: 2.3 mg/dL — ABNORMAL HIGH (ref 0.3–1.2)
Total Protein: 6.5 g/dL (ref 6.5–8.1)

## 2018-06-09 LAB — GLUCOSE, CAPILLARY
Glucose-Capillary: 85 mg/dL (ref 70–99)
Glucose-Capillary: 108 mg/dL — ABNORMAL HIGH (ref 70–99)
Glucose-Capillary: 147 mg/dL — ABNORMAL HIGH (ref 70–99)
Glucose-Capillary: 160 mg/dL — ABNORMAL HIGH (ref 70–99)

## 2018-06-09 LAB — LIPASE, BLOOD: Lipase: 384 U/L — ABNORMAL HIGH (ref 11–51)

## 2018-06-09 LAB — MAGNESIUM: Magnesium: 1.8 mg/dL (ref 1.7–2.4)

## 2018-06-09 MED ORDER — FUROSEMIDE 20 MG PO TABS
20.0000 mg | ORAL_TABLET | Freq: Two times a day (BID) | ORAL | Status: DC
  Administered 2018-06-09 – 2018-06-11 (×5): 20 mg via ORAL
  Filled 2018-06-09 (×5): qty 1

## 2018-06-09 MED ORDER — ANTIHEM FACTOR RECOMB (RFVIII) 3000 UNITS IV KIT: 1.0000 | PACK | INTRAVENOUS | Status: DC

## 2018-06-09 NOTE — Progress Notes (Signed)
[facility] Inpatient Progress Note  Subjective: Patient seen for f/u abdominal pain, elevated serum lipase, pancreatitis. S/P ERCP with dual CBC stent placement on 06/04/2018 with hospital readmission on 06/07/2018.  Still has pain but tolerated solid food today. I shared the abnormal cytology brushing report from the 05/29/2018 ERCP performed by DR. Wohl.   Objective: Vital signs in last 24 hours: Temp:  [97.7 F (36.5 C)-98.6 F (37 C)] 97.7 F (36.5 C) (02/15 0900) Pulse Rate:  [82-100] 82 (02/15 0900) Resp:  [18-20] 18 (02/15 0900) BP: (103-121)/(70-80) 117/78 (02/15 0900) SpO2:  [97 %-100 %] 97 % (02/15 0900) Blood pressure 117/78, pulse 82, temperature 97.7 F (36.5 C), temperature source Oral, resp. rate 18, height 5\' 8"  (1.727 m), weight 104.8 kg, SpO2 97 %.    Intake/Output from previous day: 02/14 0701 - 02/15 0700 In: 849 [I.V.:849] Out: 1000 [Urine:1000]  Intake/Output this shift: Total I/O In: -  Out: 500 [Urine:500]   General appearance:  *Alert, NAD Resp:  CTA Cardio: RRR GI:  Soft with epigastric tenderness without rebound. BS+ Extremities:  2+ edema in LLE.    Lab Results: Results for orders placed or performed during the hospital encounter of 06/08/18 (from the past 24 hour(s))  Urinalysis, Complete w Microscopic     Status: Abnormal   Collection Time: 06/08/18  3:58 PM  Result Value Ref Range   Color, Urine AMBER (A) YELLOW   APPearance CLEAR (A) CLEAR   Specific Gravity, Urine 1.016 1.005 - 1.030   pH 6.0 5.0 - 8.0   Glucose, UA NEGATIVE NEGATIVE mg/dL   Hgb urine dipstick NEGATIVE NEGATIVE   Bilirubin Urine NEGATIVE NEGATIVE   Ketones, ur NEGATIVE NEGATIVE mg/dL   Protein, ur 100 (A) NEGATIVE mg/dL   Nitrite NEGATIVE NEGATIVE   Leukocytes,Ua NEGATIVE NEGATIVE   RBC / HPF 0-5 0 - 5 RBC/hpf   WBC, UA 0-5 0 - 5 WBC/hpf   Bacteria, UA NONE SEEN NONE SEEN   Squamous Epithelial / LPF NONE SEEN 0 - 5   Mucus PRESENT    Hyaline  Casts, UA PRESENT   Glucose, capillary     Status: Abnormal   Collection Time: 06/08/18  4:51 PM  Result Value Ref Range   Glucose-Capillary 138 (H) 70 - 99 mg/dL   Comment 1 Notify RN   Glucose, capillary     Status: Abnormal   Collection Time: 06/08/18  9:03 PM  Result Value Ref Range   Glucose-Capillary 121 (H) 70 - 99 mg/dL  CBC     Status: Abnormal   Collection Time: 06/09/18  3:49 AM  Result Value Ref Range   WBC 6.9 4.0 - 10.5 K/uL   RBC 4.16 (L) 4.22 - 5.81 MIL/uL   Hemoglobin 10.7 (L) 13.0 - 17.0 g/dL   HCT 34.2 (L) 39.0 - 52.0 %   MCV 82.2 80.0 - 100.0 fL   MCH 25.7 (L) 26.0 - 34.0 pg   MCHC 31.3 30.0 - 36.0 g/dL   RDW 21.3 (H) 11.5 - 15.5 %   Platelets 171 150 - 400 K/uL   nRBC 0.0 0.0 - 0.2 %  Comprehensive metabolic panel     Status: Abnormal   Collection Time: 06/09/18  3:49 AM  Result Value Ref Range   Sodium 135 135 - 145 mmol/L   Potassium 3.9 3.5 - 5.1 mmol/L   Chloride 102 98 - 111 mmol/L   CO2 29 22 - 32 mmol/L   Glucose, Bld 112 (H) 70 -  99 mg/dL   BUN 23 8 - 23 mg/dL   Creatinine, Ser 1.23 0.61 - 1.24 mg/dL   Calcium 8.3 (L) 8.9 - 10.3 mg/dL   Total Protein 6.5 6.5 - 8.1 g/dL   Albumin 2.8 (L) 3.5 - 5.0 g/dL   AST 30 15 - 41 U/L   ALT 31 0 - 44 U/L   Alkaline Phosphatase 223 (H) 38 - 126 U/L   Total Bilirubin 2.3 (H) 0.3 - 1.2 mg/dL   GFR calc non Af Amer >60 >60 mL/min   GFR calc Af Amer >60 >60 mL/min   Anion gap 4 (L) 5 - 15  Magnesium     Status: None   Collection Time: 06/09/18  3:49 AM  Result Value Ref Range   Magnesium 1.8 1.7 - 2.4 mg/dL  Lipase, blood     Status: Abnormal   Collection Time: 06/09/18  3:49 AM  Result Value Ref Range   Lipase 384 (H) 11 - 51 U/L  Glucose, capillary     Status: None   Collection Time: 06/09/18  7:40 AM  Result Value Ref Range   Glucose-Capillary 85 70 - 99 mg/dL  Glucose, capillary     Status: Abnormal   Collection Time: 06/09/18 11:36 AM  Result Value Ref Range   Glucose-Capillary 108 (H) 70 - 99  mg/dL     Recent Labs    06/08/18 0030 06/08/18 0633 06/09/18 0349  WBC 8.5 7.9 6.9  HGB 11.8* 11.0* 10.7*  HCT 37.4* 33.9* 34.2*  PLT 193 183 171   BMET Recent Labs    06/08/18 0030 06/08/18 0633 06/09/18 0349  NA 137 137 135  K 3.9 3.8 3.9  CL 102 104 102  CO2 29 28 29   GLUCOSE 152* 111* 112*  BUN 25* 26* 23  CREATININE 1.35* 1.32* 1.23  CALCIUM 8.7* 8.3* 8.3*   LFT Recent Labs    06/09/18 0349  PROT 6.5  ALBUMIN 2.8*  AST 30  ALT 31  ALKPHOS 223*  BILITOT 2.3*   PT/INR No results for input(s): LABPROT, INR in the last 72 hours. Hepatitis Panel No results for input(s): HEPBSAG, HCVAB, HEPAIGM, HEPBIGM in the last 72 hours. C-Diff No results for input(s): CDIFFTOX in the last 72 hours. No results for input(s): CDIFFPCR in the last 72 hours.   Studies/Results: US Abdomen Limited  Result Date: 06/08/2018 CLINICAL DATA:  63 year old with abdominal pain. Evaluate for ascites and diagnostic paracentesis. EXAM: LIMITED ABDOMEN ULTRASOUND FOR ASCITES TECHNIQUE: Limited ultrasound survey for ascites was performed in all four abdominal quadrants. COMPARISON:  Abdominal CT 05/22/2018 FINDINGS: There is a right pleural effusion. No significant fluid around the liver. Minimal fluid in the right lower quadrant. Minimal fluid in the left lower quadrant. Trace perisplenic fluid. Evidence for small left pleural effusion. IMPRESSION: Minimal ascites.  Not enough fluid for a safe paracentesis. Bilateral pleural effusions. Electronically Signed   By: Markus Daft M.D.   On: 06/08/2018 15:12    Scheduled Inpatient Medications:   . docusate sodium  100 mg Oral BID  . DULoxetine  60 mg Oral Daily  . enoxaparin (LOVENOX) injection  40 mg Subcutaneous Q24H  . FLUoxetine  20 mg Oral Daily  . furosemide  20 mg Oral BID  . gabapentin  300 mg Oral TID  . insulin aspart  0-15 Units Subcutaneous TID WC  . insulin aspart  0-5 Units Subcutaneous QHS  . pantoprazole (PROTONIX) IV  40 mg  Intravenous Q24H  . tamsulosin  0.4 mg Oral Daily    Continuous Inpatient Infusions:    PRN Inpatient Medications:  acetaminophen **OR** acetaminophen, bisacodyl, hydrALAZINE, HYDROmorphone (DILAUDID) injection, ondansetron **OR** ondansetron (ZOFRAN) IV, oxyCODONE, senna-docusate, sodium phosphate, temazepam, zolpidem  Assessment: 1. Acute pancreatitis - Minimal to nil as there is no leukocytosis. Liver associated enzymes continue to normalize. Pancreatic enzymes may remain elevated after ERCP and do not necessarily signify pancreatitis, unless there is pain. I am concerned in this case, however, that the pain may be related to a malignancy as the brushings from the 05/29/2018 ERCP were "suspicious for malignancy". Patient has an upcoming EUS scheduled on 06/14/2018 with Dr. Mont Dutton.  2. Presumptive cirrhosis - presumably related to previous chronic hepatitis C, patient has no alcohol use history.  3. Abdominal pain - pancreatitis, malignancy, SBP, gastritis, PUD in the differential.  4. 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  5.   Hemophilia.  6.  Diabetes.Type II  Recommendations:  1. Agree with diet as tolerated. 2. IVF's , pain control with dilaudid. This is necessary as we are dealing with control of pain in a patient with possible cholangiocarcinoma. 3. Ultrasound with dx paracentesis not successful due to minimal fluid. 4. Acid suppression. 5. Following.  Teodoro K. Alice Reichert, M.D. 06/09/2018, 1:19 PM

## 2018-06-09 NOTE — Progress Notes (Signed)
[place] [facility] [place] NAME: Shawn Cardenas    MR#:  [id number]  DATE OF BIRTH:  10-18-1955  SUBJECTIVE:  CHIEF COMPLAINT:   Chief Complaint  Patient presents with  . Pancreatitis    Patient admitted with acute pancreatitis.  Patient says that his abdominal pain is more at night , less during daytime, now wants to eat regular food.  I know this patient from previous admission.Marland Kitchen  REVIEW OF SYSTEMS:  Review of Systems  Constitutional: Negative for chills and fever.  HENT: Negative for hearing loss and tinnitus.   Eyes: Negative for blurred vision, double vision and photophobia.  Respiratory: Negative for cough, hemoptysis and shortness of breath.   Cardiovascular: Negative for chest pain and palpitations.  Gastrointestinal: Positive for abdominal pain. Negative for diarrhea, heartburn and nausea.  Genitourinary: Negative for dysuria and urgency.  Musculoskeletal: Negative for myalgias and neck pain.  Skin: Negative for itching and rash.  Neurological: Negative for dizziness and headaches.  Psychiatric/Behavioral: Negative for depression and hallucinations.     DRUG ALLERGIES:   Allergies  Allergen Reactions  . Aspirin Other (See Comments)    bleeding bleeding ASA contraindicated with bleeding disorder  . Other Other (See Comments)    NSAIDS contraindicated with bleeding disorder.   VITALS:  Blood pressure 117/78, pulse 82, temperature 97.7 F (36.5 C), temperature source Oral, resp. rate 18, height 5\' 8"  (1.727 m), weight 104.8 kg, SpO2 97 %. PHYSICAL EXAMINATION:   GENERAL:  63 y.o.-year-old patient lying in the bed with no acute distress.  Obese, nontoxic-appearing EYES: Pupils equal, round, reactive to light and accommodation. No scleral icterus. Extraocular muscles intact.  HEENT: Head atraumatic, normocephalic. Oropharynx and nasopharynx clear.  NECK:  Supple, no jugular venous distention. No thyroid enlargement, no  tenderness.  LUNGS: Normal breath sounds bilaterally, no wheezing, rales,rhonchi or crepitation. No use of accessory muscles of respiration.  CARDIOVASCULAR: S1, S2 normal. No murmurs, rubs, or gallops.  ABDOMEN: Soft, distended bowel sounds present.  Nontender today. EXTREMITIES: No pedal edema, cyanosis, or clubbing.  NEUROLOGIC: Cranial nerves II through XII are intact. . Gait not checked.  PSYCHIATRIC: The patient is alert and oriented x 3.  SKIN: No obvious rash, lesion, or ulcer.   LABORATORY PANEL:  Male CBC Recent Labs  Lab 06/09/18 0349  WBC 6.9  HGB 10.7*  HCT 34.2*  PLT 171   ------------------------------------------------------------------------------------------------------------------ Chemistries  Recent Labs  Lab 06/09/18 0349  NA 135  K 3.9  CL 102  CO2 29  GLUCOSE 112*  BUN 23  CREATININE 1.23  CALCIUM 8.3*  MG 1.8  AST 30  ALT 31  ALKPHOS 223*  BILITOT 2.3*   RADIOLOGY:  US Abdomen Limited  Result Date: 06/08/2018 CLINICAL DATA:  63 year old with abdominal pain. Evaluate for ascites and diagnostic paracentesis. EXAM: LIMITED ABDOMEN ULTRASOUND FOR ASCITES TECHNIQUE: Limited ultrasound survey for ascites was performed in all four abdominal quadrants. COMPARISON:  Abdominal CT 05/22/2018 FINDINGS: There is a right pleural effusion. No significant fluid around the liver. Minimal fluid in the right lower quadrant. Minimal fluid in the left lower quadrant. Trace perisplenic fluid. Evidence for small left pleural effusion. IMPRESSION: Minimal ascites.  Not enough fluid for a safe paracentesis. Bilateral pleural effusions. Electronically Signed   By: Markus Daft M.D.   On: 06/08/2018 15:12   ASSESSMENT AND PLAN:    63 year old male status post discharge from our hospital 2 days prior to admission for acute biliary obstruction  status post pancreatic stent placement x2 by gastroenterology, returns with acute worsening abdominal pain  1. Acute recurrent  pancreatitis Note: s/p ERCP with stent placement x2 by gastroenterology/Dr. Allen Norris June 04, 2018  Continue Dilaudid for pain control.  Patient has chronic pain and cannot find narcotics at home.  Advance diet to regular diet as he wants to eat solid food.     There was concern about possibility of malignancy as the brushings from 05/29/2018 ERCP was suspicious for malignancy.  Patient scheduled for endoscopic ultrasound on 06/14/2018 likely discharge home on Monday and patient can still keep the appointment at [facility] for endoscopic ultrasound. Also presumptive cirrhosis.  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 Patient had ultrasound yesterday, has minimal ascites so paracentesis could not be performed.  2. Chronic diabetes mellitus type 2 Stable Sliding scale insulin with Accu-Cheks per routine, resume glipizide once p.o. intake is better.  3. 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 Chronic BPH Stable Continue tamsulosin  6. Hyperlipidemia Stable Continue statin therapy  7. ChronicDepression Stable Continue Prozac  DVT prophylaxis; Lovenox   All the records are reviewed and case discussed with Care Management/Social Worker. Management plans discussed with the patient, family and they are in agreement.  CODE STATUS: Full Code  TOTAL TIME TAKING CARE OF THIS PATIENT: 35 minutes.   More than 50% of the time was spent in counseling/coordination of care: YES  POSSIBLE D/C IN 2 DAYS, DEPENDING ON CLINICAL CONDITION.   [name] M.D on 06/09/2018 at 9:34 AM  Between 7am to 6pm - Pager - [phone]  After 6pm go to [email or web address] - Proofreader  [facility]  Office  [phone]  CC: Primary care physician; [name],  MD  Note: This dictation was prepared with Dragon dictation along with smaller phrase technology. Any transcriptional errors that result from this process are unintentional.

## 2018-06-10 DIAGNOSIS — K219 Gastro-esophageal reflux disease without esophagitis: Secondary | ICD-10-CM | POA: Diagnosis present

## 2018-06-10 DIAGNOSIS — Z89611 Acquired absence of right leg above knee: Secondary | ICD-10-CM | POA: Diagnosis not present

## 2018-06-10 DIAGNOSIS — E785 Hyperlipidemia, unspecified: Secondary | ICD-10-CM | POA: Diagnosis present

## 2018-06-10 DIAGNOSIS — R188 Other ascites: Secondary | ICD-10-CM | POA: Diagnosis present

## 2018-06-10 DIAGNOSIS — E119 Type 2 diabetes mellitus without complications: Secondary | ICD-10-CM | POA: Diagnosis present

## 2018-06-10 DIAGNOSIS — D66 Hereditary factor VIII deficiency: Secondary | ICD-10-CM | POA: Diagnosis present

## 2018-06-10 DIAGNOSIS — F17210 Nicotine dependence, cigarettes, uncomplicated: Secondary | ICD-10-CM | POA: Diagnosis present

## 2018-06-10 DIAGNOSIS — N400 Enlarged prostate without lower urinary tract symptoms: Secondary | ICD-10-CM | POA: Diagnosis present

## 2018-06-10 DIAGNOSIS — F329 Major depressive disorder, single episode, unspecified: Secondary | ICD-10-CM | POA: Diagnosis present

## 2018-06-10 DIAGNOSIS — Z8249 Family history of ischemic heart disease and other diseases of the circulatory system: Secondary | ICD-10-CM | POA: Diagnosis not present

## 2018-06-10 DIAGNOSIS — K859 Acute pancreatitis without necrosis or infection, unspecified: Secondary | ICD-10-CM | POA: Diagnosis present

## 2018-06-10 DIAGNOSIS — K7460 Unspecified cirrhosis of liver: Secondary | ICD-10-CM | POA: Diagnosis present

## 2018-06-10 DIAGNOSIS — G8929 Other chronic pain: Secondary | ICD-10-CM | POA: Diagnosis present

## 2018-06-10 LAB — GLUCOSE, CAPILLARY
Glucose-Capillary: 110 mg/dL — ABNORMAL HIGH (ref 70–99)
Glucose-Capillary: 148 mg/dL — ABNORMAL HIGH (ref 70–99)
Glucose-Capillary: 156 mg/dL — ABNORMAL HIGH (ref 70–99)
Glucose-Capillary: 179 mg/dL — ABNORMAL HIGH (ref 70–99)

## 2018-06-10 MED ORDER — PANTOPRAZOLE SODIUM 40 MG PO TBEC
40.0000 mg | DELAYED_RELEASE_TABLET | Freq: Every day | ORAL | Status: DC
  Administered 2018-06-11: 40 mg via ORAL
  Filled 2018-06-10: qty 1

## 2018-06-10 NOTE — Progress Notes (Addendum)
[facility] [place] NAME: Shawn Cardenas    MR#:  [id number]  DATE OF BIRTH:  1955/06/07  SUBJECTIVE:  CHIEF COMPLAINT:   Chief Complaint  Patient presents with  . Pancreatitis     patient is able to tolerate regular food but has chronic abdominal pain that is worse now due to his underlying possible malignancy.Marland Kitchen  REVIEW OF SYSTEMS:  Review of Systems  Constitutional: Negative for chills and fever.  HENT: Negative for hearing loss and tinnitus.   Eyes: Negative for blurred vision, double vision and photophobia.  Respiratory: Negative for cough, hemoptysis and shortness of breath.   Cardiovascular: Negative for chest pain and palpitations.  Gastrointestinal: Positive for abdominal pain. Negative for diarrhea, heartburn and nausea.  Genitourinary: Negative for dysuria and urgency.  Musculoskeletal: Negative for myalgias and neck pain.  Skin: Negative for itching and rash.  Neurological: Negative for dizziness and headaches.  Psychiatric/Behavioral: Negative for depression and hallucinations.     DRUG ALLERGIES:   Allergies  Allergen Reactions  . Aspirin Other (See Comments)    bleeding bleeding ASA contraindicated with bleeding disorder  . Other Other (See Comments)    NSAIDS contraindicated with bleeding disorder.   VITALS:  Blood pressure 127/82, pulse 87, temperature (!) 96.6 F (35.9 C), temperature source Axillary, resp. rate 18, height 5\' 8"  (1.727 m), weight 104.8 kg, SpO2 99 %. PHYSICAL EXAMINATION:   GENERAL:  63 y.o.-year-old patient lying in the bed with no acute distress.  Obese, nontoxic-appearing EYES: Pupils equal, round, reactive to light and accommodation. No scleral icterus. Extraocular muscles intact.  HEENT: Head atraumatic, normocephalic. Oropharynx and nasopharynx clear.  NECK:  Supple, no jugular venous distention. No thyroid enlargement, no tenderness.  LUNGS: Normal breath sounds bilaterally, no  wheezing, rales,rhonchi or crepitation. No use of accessory muscles of respiration.  CARDIOVASCULAR: S1, S2 normal. No murmurs, rubs, or gallops.  ABDOMEN: Soft, distended bowel sounds present.  Nontender today. EXTREMITIES: No pedal edema, cyanosis, or clubbing.  NEUROLOGIC: Cranial nerves II through XII are intact. . Gait not checked.  PSYCHIATRIC: The patient is alert and oriented x 3.  SKIN: No obvious rash, lesion, or ulcer.   LABORATORY PANEL:  Male CBC Recent Labs  Lab 06/09/18 0349  WBC 6.9  HGB 10.7*  HCT 34.2*  PLT 171   ------------------------------------------------------------------------------------------------------------------ Chemistries  Recent Labs  Lab 06/09/18 0349  NA 135  K 3.9  CL 102  CO2 29  GLUCOSE 112*  BUN 23  CREATININE 1.23  CALCIUM 8.3*  MG 1.8  AST 30  ALT 31  ALKPHOS 223*  BILITOT 2.3*   RADIOLOGY:  No results found. ASSESSMENT AND PLAN:    63 year old male status post discharge from our hospital 2 days prior to admission for acute biliary obstruction status post pancreatic stent placement x2 by gastroenterology, returns with acute worsening abdominal pain  1. Acute recurrent pancreatitis Note: s/p ERCP with stent placement x2 by gastroenterology/Dr. Allen Cardenas June 04, 2018  Continue Dilaudid for pain control.  Patient has chronic pain on chronic narcotics at home.  Advanced to regular diet today, tolerating it..     There was concern about possibility of malignancy as the brushings from 05/29/2018 ERCP was suspicious for malignancy.  Patient scheduled for endoscopic ultrasound on 06/14/2018 likely discharge home on Monday and patient can still keep the appointment at [facility] for [facility] ultrasound.  Patient does not know anything about this appointment, I will talk to gastroenterology  to inform the patient about this. Also presumptive cirrhosis.  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 Patient had ultrasound yesterday, has  minimal ascites so paracentesis could not be performed.  2. Chronic diabetes mellitus type 2 Stable Sliding scale insulin with Accu-Cheks per routine, resume glipizide once p.o. intake is better.  3. 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 Chronic BPH Stable Continue tamsulosin  6. Hyperlipidemia Stable Continue statin therapy  7. ChronicDepression Stable Continue Prozac  DVT prophylaxis; Lovenox  Looks like Patient has been informed about upcoming appointment for endoscopic ultrasound with Dr. Mont Cardenas at [place].  Their note below';  Shawn Cardenas returned call. I spoke with him and his spouse, Shawn Cardenas. Provided results from ERCP cytology, suspicious for malignancy. Educated on EUS. We went over all instructions below and all questions were answered. He will hold his glipizide the morning of EUS. He is not on any anticoagulants.Copy of instructions along with my contact information were mailed to his home address. Encouraged to call if he has any further questions. Reiterated to keep appointment with Dr. Gustavo Cardenas 2/17 for follow up.  INSTRUCTIONS FOR ENDOSCOPIC ULTRASOUND -Your procedure has been scheduled for February 20th with Dr. Mont Cardenas at [facility]. -The hospital will contact you to pre-register over the phone.  -To get your scheduled arrival time, please call the Endoscopy unit at  [phone] between 1-3 p.m. on:  February 19th              -ON THE DAY OF YOU PROCEDURE:              1  All the records are reviewed and case discussed with Care Management/Social Worker. Management plans discussed with the patient, family and they are in agreement.  CODE STATUS: Full Code  TOTAL TIME TAKING CARE OF THIS PATIENT: 35 minutes.   More than 50% of  the time was spent in counseling/coordination of care: YES  POSSIBLE D/C IN 2 DAYS, DEPENDING ON CLINICAL CONDITION.   Shawn Cardenas M.D on 06/10/2018 at 10:26 AM  Between 7am to 6pm - Pager - [phone]  After 6pm go to [email or web address] - Proofreader  [facility] [place] [facility]  Office  [phone]  CC: Primary care physician; [name], MD  Note: This dictation was prepared with Dragon dictation along with smaller phrase technology. Any transcriptional errors that result from this process are unintentional.

## 2018-06-11 ENCOUNTER — Other Ambulatory Visit: Payer: Self-pay

## 2018-06-11 LAB — GLUCOSE, CAPILLARY
Glucose-Capillary: 82 mg/dL (ref 70–99)
Glucose-Capillary: 109 mg/dL — ABNORMAL HIGH (ref 70–99)
Glucose-Capillary: 127 mg/dL — ABNORMAL HIGH (ref 70–99)

## 2018-06-11 MED ORDER — OXYCODONE HCL 5 MG PO TABS: 5.0000 mg | ORAL_TABLET | ORAL | 0 refills | Status: DC | PRN

## 2018-06-11 MED ORDER — OXYCODONE HCL ER 60 MG PO T12A: 60.0000 mg | EXTENDED_RELEASE_TABLET | Freq: Two times a day (BID) | ORAL | 0 refills | Status: DC

## 2018-06-11 MED ORDER — OXYCODONE HCL ER 15 MG PO T12A
60.0000 mg | EXTENDED_RELEASE_TABLET | Freq: Two times a day (BID) | ORAL | Status: DC
  Administered 2018-06-11: 60 mg via ORAL
  Filled 2018-06-11: qty 4

## 2018-06-11 MED ORDER — HEPARIN SOD (PORK) LOCK FLUSH 100 UNIT/ML IV SOLN
500.0000 [IU] | Freq: Once | INTRAVENOUS | Status: AC
  Administered 2018-06-11: 500 [IU] via INTRAVENOUS
  Filled 2018-06-11 (×2): qty 5

## 2018-06-11 NOTE — Plan of Care (Signed)
  Problem: Education: Goal: Knowledge of General Education information will improve Description: Including pain rating scale, medication(s)/side effects and non-pharmacologic comfort measures Outcome: Adequate for Discharge   Problem: Clinical Measurements: Goal: Respiratory complications will improve Outcome: Adequate for Discharge   

## 2018-06-11 NOTE — Progress Notes (Signed)
Patient is being discharge home as per order, discharge instruction provided, all follow up reviewed with patient , left chest port cath de access prior to discharge.

## 2018-06-11 NOTE — Progress Notes (Signed)
Pt tolerating meds and food well. Up in bed watching TV. Pt transfers on his own using wheelchair and upper body strength. Pt refused stool softner and all stated he doesn't want the Lovenox. Talk to pt regarding medication. Pt still stated he would not take it.  Pt given last pain med at 10pm, pt has been sleeping throughout the night. No complaints of pain at this time.

## 2018-06-11 NOTE — Discharge Summary (Signed)
Shawn Cardenas, is a 63 y.o. male  DOB [birth year]  MRN [id number].  Admission date:  06/08/2018  Admitting Physician  Gorden Harms, MD  Discharge Date:  06/11/2018   Primary MD  [name], MD  Recommendations for primary care physician for things to follow:   [place] with PCP in 1 week Follow-up with Dr. Gustavo Lah in 10 days, Dr. Marton Redwood office will call the patient for appointment.   Admission Diagnosis  Acute pancreatitis, unspecified complication status, unspecified pancreatitis type [K85.90]   Discharge Diagnosis  Acute pancreatitis, unspecified complication status, unspecified pancreatitis type [K85.90]    Active Problems:   Abdominal pain      Past Medical History:  Diagnosis Date  . Anxiety   . Arthritis   . Blood transfusion without reported diagnosis   . Clotting disorder ([place])   . Depression   . Diabetes mellitus without complication ([place])   . GERD (gastroesophageal reflux disease)   . Hypertension   . Neuromuscular disorder [facility])     Past Surgical History:  Procedure Laterality Date  . CHOLECYSTECTOMY    . ERCP N/A 05/29/2018   Procedure: ENDOSCOPIC RETROGRADE CHOLANGIOPANCREATOGRAPHY (ERCP);  Surgeon: [name], MD;  Location: [facility] ENDOSCOPY;  Service: Endoscopy;  Laterality: N/A;  . ERCP N/A 06/04/2018   Procedure: ENDOSCOPIC RETROGRADE CHOLANGIOPANCREATOGRAPHY (ERCP);  Surgeon: [name], MD;  Location: [facility] ENDOSCOPY;  Service: Endoscopy;  Laterality: N/A;  . LEG AMPUTATION Right        History of present illness and  Hospital Course:     Kindly see H&P for history of present illness and admission details, please review complete Labs, Consult reports and Test reports for all details in brief  HPI  from the history and physical done on the day of admission 63 year old male patient  with history of essential hypertension, chronic diabetes mellitus type 2, chronic hemophilia, hep C, liver cirrhosis, pression, hyperlipidemia, BPH comes in because of generalized abdominal pain, admitted for acute pancreatitis.   Hospital Course  #1 .  Abdominal pain in the epigastric area secondary to acute pancreatitis patient has been admitted recently 2 times for pancreatitis, this time he is admitted for recurrent pancreatitis, patient had ERCP 2 times recently by Dr. [name], on February 4 and then February 10, patient brushings from ERCP is concerning for cholangiocarcinoma, so scheduled for endoscopic ultrasound on February 28 by Dr. Mont Dutton at [facility], patient understands that he has appointment. #2 .chronic abdominal pain, patient has been on OxyContin 40 mg twice daily, increase the OxyContin to 52m p.o. twice daily as he said his abdominal pain is not controlled.  Patient can continue oxycodone 5 mg every 4-6 hours as needed for short-acting relief. Again patient understands that he needs to show up for his appointment on February 20 for endoscopic ultrasound.  Epic texted Dr. SGustavo Lahwho suggested that he would like to see the patient after endoscopic ultrasound.  Originally patient has appointment to see Dr. SGustavo Lahtoday afternoon but that has been canceled. As per Dr. SGustavo Lah  Discharge Condition: Stable   Follow UP  Follow-up Information    HMaryland Pink MD. Schedule an appointment as soon as possible for a visit in 1 week(s).   Specialty:  Family Medicine Contact information: [address] [phone] [phone]            Discharge Instructions  and  Discharge Medications      Allergies as of 06/11/2018  Reactions   Aspirin Other (See Comments)   bleeding bleeding ASA contraindicated with bleeding disorder   Other Other (See Comments)   NSAIDS contraindicated with bleeding disorder.       Medication List    STOP taking these medications   cephALEXin 500 MG capsule Commonly known as:  KEFLEX   oxyCODONE 10 mg 12 hr tablet Commonly known as:  OXYCONTIN Replaced by:  oxyCODONE 5 MG immediate release tablet You also have another medication with the same name that you need to continue taking as instructed.     TAKE these medications   Antihemophilic Factor (Recomb) 0258 units Kit Inject 1 kit into the vein every other day. Halixate or Kogenate   DULoxetine 60 MG capsule Commonly known as:  CYMBALTA Take 60 mg by mouth daily.   esomeprazole 40 MG capsule Commonly known as:  NEXIUM Take 40 mg by mouth daily.   FLUoxetine 20 MG capsule Commonly known as:  PROZAC Take 20 mg by mouth daily.   furosemide 20 MG tablet Commonly known as:  LASIX Take 1 tablet (20 mg total) by mouth 2 (two) times daily.   gabapentin 300 MG capsule Commonly known as:  NEURONTIN Take 300 mg by mouth 3 (three) times daily.   glipiZIDE 5 MG tablet Commonly known as:  GLUCOTROL Take 1 tablet (5 mg total) by mouth daily before breakfast.   lisinopril 10 MG tablet Commonly known as:  PRINIVIL,ZESTRIL Take 1 tablet by mouth daily.   NARCAN 4 MG/0.1ML Liqd nasal spray kit Generic drug:  naloxone Place 1 spray into both nostrils every 5 (five) minutes as needed (overdose).   oxyCODONE 5 MG immediate release tablet Commonly known as:  Oxy IR/ROXICODONE Take 1 tablet (5 mg total) by mouth every 4 (four) hours as needed for moderate pain. What changed:  You were already taking a medication with the same name, and this prescription was added. Make sure you understand how and when to take each. Replaces:  oxyCODONE 10 mg 12 hr tablet   oxyCODONE 60 MG 12 hr tablet Commonly known as:  OXYCONTIN Take 60 mg by mouth every 12 (twelve) hours. What changed:    medication strength  how much to take  when to take this  Another medication with the same name was removed. Continue taking  this medication, and follow the directions you see here.   pravastatin 40 MG tablet Commonly known as:  PRAVACHOL Take 1 tablet (40 mg total) by mouth daily.   tamsulosin 0.4 MG Caps capsule Commonly known as:  FLOMAX Take 0.4 mg by mouth daily.         Diet and Activity recommendation: See Discharge Instructions above   Consults obtained -gastroenterology   Major procedures and Radiology Reports - PLEASE review detailed and final reports for all details, in brief -      Dg Chest 2 View  Result Date: 06/04/2018 CLINICAL DATA:  History of pancreatitis and shortness of Breath EXAM: CHEST - 2 VIEW COMPARISON:  05/25/2018 FINDINGS: Cardiac shadow is stable. Left chest wall port is again seen. Diffuse vascular congestion and interstitial edema is again identified and stable. Small effusions are again seen bilaterally. No acute bony abnormality is noted. IMPRESSION: Changes of CHF with edema and effusions. Electronically Signed   By: Inez Catalina M.D.   On: 06/04/2018 00:41   Dg Chest 2 View  Result Date: 05/25/2018 CLINICAL DATA:  Left chest pain since this afternoon. [place] out of wheelchair this morning. EXAM:  CHEST - 2 VIEW COMPARISON:  05/25/2018 FINDINGS: Cardiac enlargement with prominent pulmonary vascularity. Central interstitial pattern to the lungs likely interstitial edema. Small bilateral pleural effusions. Fluid in the fissures. No consolidation or pneumothorax. Mediastinal contours appear intact. Left central vascular line with tip over the cavoatrial junction region. Degenerative changes in both shoulders with degenerative fragmentation versus postoperative change in the distal right clavicle and acromion. IMPRESSION: Cardiac enlargement with pulmonary vascular congestion and interstitial edema. Small bilateral pleural effusions. No focal consolidation. Electronically Signed   By: Lucienne Capers M.D.   On: 05/25/2018 22:28   Dg Ribs Unilateral Left  Result Date:  05/26/2018 CLINICAL DATA:  Pt states he fell out of his wheelchair yesterday. Left axillary rib pain since. Pt admitted 05/23/2018 for pancreatitis. Hx - HTN, neuromuscular disorder, DM, current smoker 2 ppd. EXAM: LEFT RIBS - 2 VIEW COMPARISON:  Current chest radiographs. Prior chest radiograph dated 02/18/2018. FINDINGS: No fracture or other bone lesions are seen involving the ribs. IMPRESSION: Negative. Electronically Signed   By: Lajean Manes M.D.   On: 05/26/2018 14:28   Ct Abdomen Pelvis W Contrast  Result Date: 05/22/2018 CLINICAL DATA:  Epigastric pain for 2 months EXAM: CT ABDOMEN AND PELVIS WITH CONTRAST TECHNIQUE: Multidetector CT imaging of the abdomen and pelvis was performed using the standard protocol following bolus administration of intravenous contrast. CONTRAST:  1103m OMNIPAQUE IOHEXOL 300 MG/ML  SOLN COMPARISON:  None. FINDINGS: Lower chest: Lung bases demonstrate bilateral pleural effusions right greater than left without focal infiltrate or sizable parenchymal nodule. Hepatobiliary: Liver is somewhat nodular in appearance consistent with underlying cirrhotic change. No focal mass lesion is seen. The gallbladder has been surgically removed. Very mild perihepatic fluid is noted. Pancreas: Pancreas is within normal limits. Spleen: Spleen is unremarkable with some mild perisplenic fluid. Adrenals/Urinary Tract: Adrenal glands are unremarkable. Kidneys are well visualized bilaterally. No renal calculi or obstructive changes are noted. The bladder is decompressed. Stomach/Bowel: Mild diverticular change of the colon is noted without evidence of diverticulitis. No obstructive changes are seen. The appendix is within normal limits. No small bowel abnormality or gastric abnormality is seen. Vascular/Lymphatic: Mild small mesenteric and retroperitoneal lymph nodes are seen likely reactive in nature. No sizable adenopathy is noted. Reproductive: Prostate is unremarkable. Other: Mild ascites is noted  within the abdomen abdominal is surrounding the liver and spleen. Musculoskeletal: Mild degenerative changes of the lumbar spine are noted. IMPRESSION: Changes of cirrhosis with mild ascites and bilateral pleural effusions. Normal-appearing appendix. Mild lymph nodes within the mesentery and retroperitoneum likely reactive in nature. No sizable adenopathy is noted. These results will be called to the ordering clinician or representative by the Radiologist Assistant, and communication documented in the PACS or zVision Dashboard. Electronically Signed   By: MInez CatalinaM.D.   On: 05/22/2018 13:05   Mr 3d Recon At Scanner  Result Date: 05/27/2018 CLINICAL DATA:  Acute pancreatitis EXAM: MRI ABDOMEN WITHOUT AND WITH CONTRAST (INCLUDING MRCP) TECHNIQUE: Multiplanar multisequence MR imaging of the abdomen was performed both before and after the administration of intravenous contrast. Heavily T2-weighted images of the biliary and pancreatic ducts were obtained, and three-dimensional MRCP images were rendered by post processing. CONTRAST:  10 cc Gadavist COMPARISON:  CT scan 05/22/2018 FINDINGS: Markedly motion degraded study. Lower chest: Moderate right and small left pleural effusions. Hepatobiliary: No suspicious focal abnormality within the liver parenchyma. Mild intrahepatic biliary duct dilatation evident the extrahepatic common duct measures up to 9 mm diameter. No evidence for stones  within the common duct. 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 Gallbladder surgically absent. Pancreas: No dilatation of the main pancreatic duct. There is soft tissue fullness in the region of the pancreatic head, but no differential enhancement can be appreciated. No evidence for pancreatic pseudocyst. Spleen:  No splenomegaly. No focal mass lesion. Adrenals/Urinary Tract: No adrenal nodule or mass. 2.2 cm exophytic lesion posterior interpolar left kidney is compatible with a cyst.  1.2 cm cyst is identified in the upper pole of the left kidney. Stomach/Bowel: Stomach is unremarkable. No gastric wall thickening. No evidence of outlet obstruction. Duodenum is normally positioned as is the ligament of Treitz. No small bowel or colonic dilatation within the visualized abdomen. Vascular/Lymphatic: No abdominal aortic aneurysm. There is no gastrohepatic or hepatoduodenal ligament lymphadenopathy. No intraperitoneal or retroperitoneal lymphadenopathy. Other: Diffuse edema is identified within the retroperitoneum, mesentery, and body wall. Musculoskeletal: No abnormal marrow enhancement within the visualized bony anatomy. IMPRESSION: 1. 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 There is some soft tissue fullness in the head of pancreas but no discrete or infiltrative mass can be discerned and there is no associated dilatation of the main pancreatic duct. No findings for choledocholithiasis. ERCP/EUS may prove helpful to further evaluate. Follow-up imaging could be performed when patient is better able to cooperate with positioning and breath holding. 2. Moderate right and small left pleural effusion associated with diffuse edema in the abdomen and body wall. 3. Left renal cysts. Electronically Signed   By: Misty Stanley M.D.   On: 05/27/2018 17:31   US Abdomen Limited  Result Date: 06/08/2018 CLINICAL DATA:  63 year old with abdominal pain. Evaluate for ascites and diagnostic paracentesis. EXAM: LIMITED ABDOMEN ULTRASOUND FOR ASCITES TECHNIQUE: Limited ultrasound survey for ascites was performed in all four abdominal quadrants. COMPARISON:  Abdominal CT 05/22/2018 FINDINGS: There is a right pleural effusion. No significant fluid around the liver. Minimal fluid in the right lower quadrant. Minimal fluid in the left lower quadrant. Trace perisplenic fluid. Evidence for small left pleural effusion. IMPRESSION:  Minimal ascites.  Not enough fluid for a safe paracentesis. Bilateral pleural effusions. Electronically Signed   By: Markus Daft M.D.   On: 06/08/2018 15:12   Dg Chest Port 1 View  Result Date: 05/25/2018 CLINICAL DATA:  Shortness of breath EXAM: PORTABLE CHEST 1 VIEW COMPARISON:  02/18/2018 FINDINGS: Unchanged cardiomegaly distorted by leftward rotation. There is left-sided porta catheter with tip at the upper cavoatrial junction. Generalized interstitial coarsening. Layering pleural fluid is again seen on the right at least. No pneumothorax. IMPRESSION: Bronchitic and/or congestive interstitial coarsening with small layering pleural fluid on the right at least. Electronically Signed   By: Monte Fantasia M.D.   On: 05/25/2018 05:26   Dg C-arm 1-60 Min-no Report  Result Date: 06/04/2018 Fluoroscopy was utilized by the requesting physician.  No radiographic interpretation.   Dg C-arm 1-60 Min-no Report  Result Date: 05/29/2018 Fluoroscopy was utilized by the requesting physician.  No radiographic interpretation.   Mr Abdomen Mrcp W Wo Contast  Result Date: 05/27/2018 CLINICAL DATA:  Acute pancreatitis EXAM: MRI ABDOMEN WITHOUT AND WITH CONTRAST (INCLUDING MRCP) TECHNIQUE: Multiplanar multisequence MR imaging of the abdomen was performed both before and after the administration of intravenous contrast. Heavily T2-weighted images of the biliary and pancreatic ducts were obtained, and three-dimensional MRCP images were rendered by post processing. CONTRAST:  10 cc Gadavist COMPARISON:  CT scan 05/22/2018 FINDINGS: Markedly motion degraded study. Lower chest: Moderate right and small left pleural effusions. Hepatobiliary: No suspicious focal abnormality within the liver parenchyma. Mild intrahepatic biliary duct dilatation evident the extrahepatic common duct measures up to 9 mm diameter. No evidence for stones within the common duct. Abrupt stricture ring of the common bile duct noted as it enters the head  of pancreas. Gallbladder surgically absent. Pancreas: No dilatation of the main pancreatic duct. There is soft tissue fullness in the region of the pancreatic head, but no differential enhancement can be appreciated. No evidence for pancreatic pseudocyst. Spleen:  No splenomegaly. No focal mass lesion. Adrenals/Urinary Tract: No adrenal nodule or mass. 2.2 cm exophytic lesion posterior interpolar left kidney is compatible with a cyst. 1.2 cm cyst is identified in the upper pole of the left kidney. Stomach/Bowel: Stomach is unremarkable. No gastric wall thickening. No evidence of outlet obstruction. Duodenum is normally positioned as is the ligament of Treitz. No small bowel or colonic dilatation within the visualized abdomen. Vascular/Lymphatic: No abdominal aortic aneurysm. There is no gastrohepatic or hepatoduodenal ligament lymphadenopathy. No intraperitoneal or retroperitoneal lymphadenopathy. Other: Diffuse edema is identified within the retroperitoneum, mesentery, and body wall. Musculoskeletal: No abnormal marrow enhancement within the visualized bony anatomy. IMPRESSION: 1. 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 There is some soft tissue fullness in the head of pancreas but no discrete or infiltrative mass can be discerned and there is no associated dilatation of the main pancreatic duct. No findings for choledocholithiasis. ERCP/EUS may prove helpful to further evaluate. Follow-up imaging could be performed when patient is better able to cooperate with positioning and breath holding. 2. Moderate right and small left pleural effusion associated with diffuse edema in the abdomen and body wall. 3. Left renal cysts. Electronically Signed   By: Misty Stanley M.D.   On: 05/27/2018 17:31    Micro Results     No results found for this or any previous visit (from the past 240 hour(s)).     Today   Subjective:    Shawn Cardenas today has no headache,no chest abdominal pain,no new weakness tingling or numbness, feels much better wants to go home today.   Objective:   Blood pressure 119/75, pulse 74, temperature (!) 97.5 F (36.4 C), temperature source Oral, resp. rate 18, height _0  (1.727 m), weight 104.8 kg, SpO2 100 %.   Intake/Output Summary (Last 24 hours) at 06/11/2018 1142 Last data filed at 06/10/2018 2120 Gross per 24 hour  Intake 200 ml  Output 1400 ml  Net -1200 ml    Exam Awake Alert, Oriented x 3, No new F.N deficits, Normal affect [place].AT,PERRAL Supple Neck,No JVD, No cervical lymphadenopathy appriciated.  Symmetrical Chest wall movement, Good air movement bilaterally, CTAB RRR,No Gallops,Rubs or new Murmurs, No Parasternal Heave +ve B.Sounds, Abd Soft, Non tender, No organomegaly appriciated, No rebound -guarding or rigidity. No Cyanosis, Clubbing or edema, No new Rash or bruise  Data Review   CBC w Diff:  Lab Results  Component Value Date   WBC 6.9 06/09/2018   HGB 10.7 (L) 06/09/2018   HCT 34.2 (L) 06/09/2018   PLT 171 06/09/2018   LYMPHOPCT 15 02/25/2016   MONOPCT 4 02/25/2016   EOSPCT 1 02/25/2016   BASOPCT 0 02/25/2016    CMP:  Lab Results  Component Value Date   NA 135 06/09/2018  K 3.9 06/09/2018   CL 102 06/09/2018   CO2 29 06/09/2018   BUN 23 06/09/2018   CREATININE 1.23 06/09/2018   PROT 6.5 06/09/2018   ALBUMIN 2.8 (L) 06/09/2018   BILITOT 2.3 (H) 06/09/2018   ALKPHOS 223 (H) 06/09/2018   AST 30 06/09/2018   ALT 31 06/09/2018  .   Total Time in preparing paper work, data evaluation and todays exam - 35 minutes  [name] M.D on 06/11/2018 at 11:42 AM    Note: This dictation was prepared with Dragon dictation along with smaller phrase technology. Any transcriptional errors that result from this process are unintentional.

## 2018-06-11 NOTE — Telephone Encounter (Signed)
Done-my office will call.

## 2018-06-14 ENCOUNTER — Ambulatory Visit
Admission: RE | Admit: 2018-06-14 | Discharge: 2018-06-14 | Disposition: A | Discharge status: Home / Self Care | Payer: Medicare Other | Attending: Internal Medicine | Admitting: Internal Medicine

## 2018-06-14 ENCOUNTER — Other Ambulatory Visit: Payer: Self-pay

## 2018-06-14 ENCOUNTER — Encounter
Admission: RE | Disposition: A | Discharge status: Home / Self Care | Payer: Self-pay | Source: Home / Self Care | Attending: Internal Medicine

## 2018-06-14 ENCOUNTER — Inpatient Hospital Stay: Payer: Medicare Other | Attending: Internal Medicine

## 2018-06-14 ENCOUNTER — Ambulatory Visit: Payer: Medicare Other | Admitting: Anesthesiology

## 2018-06-14 DIAGNOSIS — K7460 Unspecified cirrhosis of liver: Secondary | ICD-10-CM | POA: Insufficient documentation

## 2018-06-14 DIAGNOSIS — Z888 Allergy status to other drugs, medicaments and biological substances status: Secondary | ICD-10-CM | POA: Diagnosis not present

## 2018-06-14 DIAGNOSIS — F419 Anxiety disorder, unspecified: Secondary | ICD-10-CM | POA: Insufficient documentation

## 2018-06-14 DIAGNOSIS — Z7984 Long term (current) use of oral hypoglycemic drugs: Secondary | ICD-10-CM | POA: Insufficient documentation

## 2018-06-14 DIAGNOSIS — E119 Type 2 diabetes mellitus without complications: Secondary | ICD-10-CM | POA: Diagnosis not present

## 2018-06-14 DIAGNOSIS — F329 Major depressive disorder, single episode, unspecified: Secondary | ICD-10-CM | POA: Diagnosis not present

## 2018-06-14 DIAGNOSIS — Z87891 Personal history of nicotine dependence: Secondary | ICD-10-CM | POA: Diagnosis not present

## 2018-06-14 DIAGNOSIS — Z79899 Other long term (current) drug therapy: Secondary | ICD-10-CM | POA: Diagnosis not present

## 2018-06-14 DIAGNOSIS — K219 Gastro-esophageal reflux disease without esophagitis: Secondary | ICD-10-CM | POA: Insufficient documentation

## 2018-06-14 DIAGNOSIS — G709 Myoneural disorder, unspecified: Secondary | ICD-10-CM | POA: Insufficient documentation

## 2018-06-14 DIAGNOSIS — Z886 Allergy status to analgesic agent status: Secondary | ICD-10-CM | POA: Insufficient documentation

## 2018-06-14 DIAGNOSIS — D66 Hereditary factor VIII deficiency: Secondary | ICD-10-CM | POA: Diagnosis not present

## 2018-06-14 DIAGNOSIS — K831 Obstruction of bile duct: Secondary | ICD-10-CM | POA: Insufficient documentation

## 2018-06-14 DIAGNOSIS — M1990 Unspecified osteoarthritis, unspecified site: Secondary | ICD-10-CM | POA: Insufficient documentation

## 2018-06-14 DIAGNOSIS — Z89611 Acquired absence of right leg above knee: Secondary | ICD-10-CM | POA: Insufficient documentation

## 2018-06-14 DIAGNOSIS — I10 Essential (primary) hypertension: Secondary | ICD-10-CM | POA: Diagnosis not present

## 2018-06-14 DIAGNOSIS — K862 Cyst of pancreas: Secondary | ICD-10-CM | POA: Insufficient documentation

## 2018-06-14 DIAGNOSIS — Z9049 Acquired absence of other specified parts of digestive tract: Secondary | ICD-10-CM | POA: Insufficient documentation

## 2018-06-14 DIAGNOSIS — Z8249 Family history of ischemic heart disease and other diseases of the circulatory system: Secondary | ICD-10-CM | POA: Insufficient documentation

## 2018-06-14 HISTORY — PX: EUS: SHX5427

## 2018-06-14 LAB — GLUCOSE, CAPILLARY: Glucose-Capillary: 84 mg/dL (ref 70–99)

## 2018-06-14 SURGERY — ULTRASOUND, UPPER GI TRACT, ENDOSCOPIC
Anesthesia: General

## 2018-06-14 MED ORDER — FENTANYL CITRATE (PF) 100 MCG/2ML IJ SOLN
INTRAMUSCULAR | Status: AC
  Filled 2018-06-14: qty 2

## 2018-06-14 MED ORDER — LIDOCAINE HCL (CARDIAC) PF 100 MG/5ML IV SOSY
PREFILLED_SYRINGE | INTRAVENOUS | Status: DC | PRN
  Administered 2018-06-14: 80 mg via INTRAVENOUS

## 2018-06-14 MED ORDER — SODIUM CHLORIDE 0.9 % IV SOLN
INTRAVENOUS | Status: DC
  Administered 2018-06-14: 13:00:00 via INTRAVENOUS

## 2018-06-14 MED ORDER — PROPOFOL 500 MG/50ML IV EMUL
INTRAVENOUS | Status: DC | PRN
  Administered 2018-06-14: 100 ug/kg/min via INTRAVENOUS

## 2018-06-14 MED ORDER — HEPARIN SOD (PORK) LOCK FLUSH 100 UNIT/ML IV SOLN
250.0000 [IU] | INTRAVENOUS | Status: AC | PRN
  Administered 2018-06-14: 250 [IU]

## 2018-06-14 MED ORDER — SODIUM CHLORIDE 0.9% FLUSH
10.0000 mL | INTRAVENOUS | Status: AC | PRN
  Administered 2018-06-14: 10 mL

## 2018-06-14 MED ORDER — FENTANYL CITRATE (PF) 100 MCG/2ML IJ SOLN
INTRAMUSCULAR | Status: DC | PRN
  Administered 2018-06-14 (×2): 25 ug via INTRAVENOUS
  Administered 2018-06-14: 50 ug via INTRAVENOUS

## 2018-06-14 MED ORDER — HEPARIN SOD (PORK) LOCK FLUSH 100 UNIT/ML IV SOLN
INTRAVENOUS | Status: AC
  Administered 2018-06-14: 250 [IU]
  Filled 2018-06-14: qty 5

## 2018-06-14 MED ORDER — PROPOFOL 10 MG/ML IV BOLUS
INTRAVENOUS | Status: DC | PRN
  Administered 2018-06-14: 70 mg via INTRAVENOUS

## 2018-06-14 NOTE — Interval H&P Note (Signed)
History and Physical Interval Note:  06/14/2018 1:00 PM  Shawn Cardenas  has presented today for surgery, with the diagnosis of acute pancreatitis, dilated cbd, cytology suspicious for malignancy  The various methods of treatment have been discussed with the patient and family. After consideration of risks, benefits and other options for treatment, the patient has consented to  Procedure(s): FULL UPPER ENDOSCOPIC ULTRASOUND (EUS) RADIAL (N/A) as a surgical intervention .  The patient's history has been reviewed, patient examined, no change in status, stable for surgery.  I have reviewed the patient's chart and labs.  Questions were answered to the patient's satisfaction.     [name]

## 2018-06-14 NOTE — Anesthesia Post-op Follow-up Note (Signed)
Anesthesia QCDR form completed.        

## 2018-06-14 NOTE — Progress Notes (Signed)
Tumor Board Documentation  JASMINE MACEACHERN was presented by Hortencia Pilar at our Tumor Board on 06/14/2018, which included representatives from medical oncology, radiation oncology, surgical, radiology, pathology, navigation, internal medicine, research, palliative care, pulmonology.  Maxmilian currently presents for discussion, for [place] with history of the following treatments: active survellience.  Additionally, we reviewed previous medical and familial history, history of present illness, and recent lab results along with all available histopathologic and imaging studies. The tumor board considered available treatment options and made the following recommendations:   EUS  The following procedures/referrals were also placed: No orders of the defined types were placed in this encounter.   Clinical Trial Status: not discussed   Tumor board is a meeting of clinicians from various specialty areas who evaluate and discuss patients for whom a multidisciplinary approach is being considered. Final determinations in the plan of care are those of the provider(s). The responsibility for follow up of recommendations given during tumor board is that of the provider.   Today's extended care, comprehensive team conference, Immanuel was not present for the discussion and was not examined.   Multidisciplinary Tumor Board is a multidisciplinary case peer review process.  Decisions discussed in the Multidisciplinary Tumor Board reflect the opinions of the specialists present at the conference without having examined the patient.  Ultimately, treatment and diagnostic decisions rest with the primary provider(s) and the patient.

## 2018-06-14 NOTE — Op Note (Signed)
[facility] Patient Name: Shawn Cardenas Procedure Date: 06/14/2018 11:41 AM MRN: [id number] Account #: [id number] Date of Birth: [birth year] Admit Type: Outpatient Age: 63 Room: [facility] ENDO ROOM 3 Gender: Male Note Status: Finalized Procedure:            Upper EUS Indications:          Obstruction of bile duct on ERCP s/p placement of two                        plastic stents (brushings reportedly "suspicious for                        malignancy"), Recent acute pancreatitis (likely                        smoldering at this point), No mass seen on recent CT                        and MRI scans Patient Profile:      Refer to note in patient chart for documentation of                        history and physical. Providers:            Murray Hodgkins. [place] Referring MD:         Irven Easterly. Kary Kos, MD (Referring MD), [name] MD, MD                        (Referring MD) Medicines:            Propofol per Anesthesia Complications:        No immediate complications. Procedure:            Pre-Anesthesia Assessment:                       Prior to the procedure, a History and Physical was                        performed, and patient medications and allergies were                        reviewed. The patient is competent. The risks and                        benefits of the procedure and the sedation options and                        risks were discussed with the patient. All questions                        were answered and informed consent was obtained.                        Patient identification and proposed procedure were                        verified by the physician, the nurse and the                        anesthetist in  the pre-procedure area. Mental Status                        Examination: alert and oriented. Airway Examination:                        normal oropharyngeal airway and neck mobility.                        Respiratory  Examination: clear to auscultation. CV                        Examination: normal. Prophylactic Antibiotics: The                        patient does not require prophylactic antibiotics.                        Prior Anticoagulants: The patient has taken no previous                        anticoagulant or antiplatelet agents. ASA Grade                        Assessment: III - A patient with severe systemic                        disease. After reviewing the risks and benefits, the                        patient was deemed in satisfactory condition to undergo                        the procedure. The anesthesia plan was to use monitored                        anesthesia care (MAC). Immediately prior to                        administration of medications, the patient was                        re-assessed for adequacy to receive sedatives. The                        heart rate, respiratory rate, oxygen saturations, blood                        pressure, adequacy of pulmonary ventilation, and                        response to care were monitored throughout the                        procedure. The physical status of the patient was                        re-assessed after the procedure.                       After obtaining informed consent, the endoscope was  passed under direct vision. Throughout the procedure,                        the patient's blood pressure, pulse, and oxygen                        saturations were monitored continuously. The Endoscope                        was introduced through the mouth, and advanced to the                        second part of duodenum. The was introduced through the                        mouth, and advanced to the duodenum for ultrasound                        examination from the esophagus, stomach and duodenum.                        The upper EUS was accomplished without difficulty. The                        patient  tolerated the procedure well. Findings:      ENDOSCOPIC FINDING: :      The examined esophagus was endoscopically normal.      The entire examined stomach was endoscopically normal.      Two previously placed plastic biliary stents were seen emerging from the       major papilla.      The exam of the duodenum was otherwise normal.      ENDOSONOGRAPHIC FINDING: :      Two plastic stents were visualized endosonographically in the common       bile duct. Extension of the stent was noted in the common hepatic duct.       The bile duct wall appeared thickened throughout without obvious mass       lesion. There was also suggestion of sludge and air within the bile       duct. The CBD measured 4.2 mm and the CHD measured 5.4 mm.      Pancreatic parenchymal abnormalities were noted in the pancreatic head.       These consisted of hyperechoic strands and a heterogeneous appearance.       The parenchyma (other than the cyst mentioned below) and duct were       otherwise normal. The PD measured 2.1 mm in the head, 2.3 mm in the       neck, 1.9 mm in the body, and 1.6 mm in the tail. No obvious mass lesion       was seen in the pancreas.      An anechoic lesion suggestive of a cyst was identified in the pancreatic       body. It is not in obvious communication with the pancreatic duct. The       lesion measured 8 mm by 7 mm in maximal cross-sectional diameter. There       was a single compartment without septae. The outer wall of the lesion       was not seen. There was no associated mass. There was no  internal debris       within the fluid-filled cavity.      A few abnormal lymph nodes were visualized in the porta hepatis region.       The largest measured 12 mm by 7 mm in maximal cross-sectional diameter.       The nodes were oval, isoechoic and had well defined margins.      There was diffuse abnormal echotexture in the left lobe of the liver.       This was characterized by a nodular liver edge  contour.      The region of the celiac plexus and celiac ganglia was visualized and       showed no sign of significant endosonographic abnormality. Impression:           EGD Impressions:                       - Normal esophagus.                       - Normal stomach.                       - Previously placed plastic biliary stents emerging                        from the major ampulla.                       - Otherwise normal examined duodenum.                       EUS Impressions:                       - Two plastic stents were visualized                        endosonographically in the common bile duct/common                        hepatic duct. The walls of the duct were thickened,                        however this could be reactive from recent stent                        placement. Air artifact and sludge was also seen in the                        duct.                       - Pancreatic parenchymal abnormalities consisting of                        hyperechoic strands and a heterogeneous appearance were                        noted in the pancreatic head. Although no obvious mass                        was seen, the presence of shadowing artifact from the  bile duct stents make EUS visualization of small mass                        lesions difficult.                       - A benign appearing cystic lesion was seen in the                        pancreatic body. FNA not performed given the small size                        and lack of high risk features.                       - A few lymph nodes were visualized in the porta                        hepatis and peripancreatic regions. Tissue has not been                        obtained. However, the endosonographic appearance is                        suggestive of reactive lymph nodes either from recent                        pancreatitis or underlying cirrhosis.                       - There was a  diffusely nodular appearance to the left                        lobe of the liver edge. Consistent with known cirrhosis.                       - Normal celiac region.                       - No specimens collected. Recommendation:       - Discharge patient to home (ambulatory).                       - Recommend a short interval repeat CT or MRI scan in 4                        weeks after pancreatitis has healed. Repeat EUS if mass                        is seen on follow-up scan, however this will need to be                        performed at [facility] given the need for same day ERCP for                        bile duct stent removal to allow optimal visualization                        of the pancreas head. Also  will require documentation                        by his hematologist requiring optimal management of                        factor replacement if a fine needle biopsy is necessary                        at that time.                       - The findings and recommendations were discussed with                        the patient and his family.                       - Return to referring physician as previously scheduled. Procedure Code(s):    --- Professional ---                       [phone], Esophagogastroduodenoscopy, flexible, transoral;                        with endoscopic ultrasound examination limited to the                        esophagus, stomach or duodenum, and adjacent structures Diagnosis Code(s):    --- Professional ---                       K83.1, Obstruction of bile duct                       K85.90, Acute pancreatitis without necrosis or                        infection, unspecified                       K86.2, Cyst of pancreas                       R93.2, Abnormal findings on diagnostic imaging of liver                        and biliary tract                       I89.9, Noninfective disorder of lymphatic vessels and                        lymph nodes,  unspecified                       K86.9, Disease of pancreas, unspecified CPT copyright 2018 [employer]. All rights reserved. The codes documented in this report are preliminary and upon coder review may  be revised to meet current compliance requirements. Attending Participation:      I personally performed the entire procedure without the assistance of a       fellow, resident or surgical assistant. [place],  06/14/2018 2:02:21 PM This report has been signed electronically. Number of  Addenda: 0 Note Initiated On: 06/14/2018 11:41 AM      [facility]

## 2018-06-14 NOTE — Transfer of Care (Signed)
Immediate Anesthesia Transfer of Care Note  Patient: Shawn Cardenas  Procedure(s) Performed: FULL UPPER ENDOSCOPIC ULTRASOUND (EUS) RADIAL (N/A )  Patient Location: PACU  Anesthesia Type:General  Level of Consciousness: sedated  Airway & Oxygen Therapy: Patient Spontanous Breathing and Patient connected to nasal cannula oxygen  Post-op Assessment: Report given to RN and Post -op Vital signs reviewed and stable  Post vital signs: Reviewed and stable  Last Vitals:  Vitals Value Taken Time  BP 92/60 06/14/2018  1:50 PM  Temp 36.1 C 06/14/2018  1:48 PM  Pulse 70 06/14/2018  1:50 PM  Resp 9 06/14/2018  1:50 PM  SpO2 95 % 06/14/2018  1:50 PM  Vitals shown include unvalidated device data.  Last Pain:  Vitals:   06/14/18 1348  TempSrc: Tympanic  PainSc:          Complications: No apparent anesthesia complications

## 2018-06-14 NOTE — Anesthesia Preprocedure Evaluation (Signed)
Anesthesia Evaluation  Patient identified by MRN, date of birth, ID band Patient awake    Reviewed: Allergy & Precautions, H&P , NPO status , Patient's Chart, lab work & pertinent test results  History of Anesthesia Complications Negative for: history of anesthetic complications  Airway Mallampati: III  TM Distance: <3 FB Neck ROM: full    Dental  (+) Chipped, Poor Dentition, Missing, Upper Dentures   Pulmonary neg shortness of breath, former smoker,           Cardiovascular hypertension, (-) angina+ CAD       Neuro/Psych PSYCHIATRIC DISORDERS  Neuromuscular disease negative psych ROS   GI/Hepatic Neg liver ROS, GERD  ,  Endo/Other  diabetes, Type 2  Renal/GU negative Renal ROS  negative genitourinary   Musculoskeletal   Abdominal   Peds  Hematology negative hematology ROS (+)   Anesthesia Other Findings Past Medical History: No date: Anxiety No date: Arthritis No date: Blood transfusion without reported diagnosis No date: Clotting disorder ([place]) No date: Depression No date: Diabetes mellitus without complication (HCC) No date: GERD (gastroesophageal reflux disease) No date: Hypertension No date: Neuromuscular disorder [facility])  Past Surgical History: No date: CHOLECYSTECTOMY 05/29/2018: ERCP; N/A     Comment:  Procedure: ENDOSCOPIC RETROGRADE               CHOLANGIOPANCREATOGRAPHY (ERCP);  Surgeon: [name],               MD;  Location: [facility] ENDOSCOPY;  Service: Endoscopy;                Laterality: N/A; 06/04/2018: ERCP; N/A     Comment:  Procedure: ENDOSCOPIC RETROGRADE               CHOLANGIOPANCREATOGRAPHY (ERCP);  Surgeon: [name],               MD;  Location: [facility] ENDOSCOPY;  Service: Endoscopy;                Laterality: N/A; No date: LEG AMPUTATION; Right  BMI    Body Mass Index:  34.97 kg/m      Reproductive/Obstetrics negative OB ROS                              Anesthesia Physical Anesthesia Plan  ASA: III  Anesthesia Plan: General   Post-op Pain Management:    Induction: Intravenous  PONV Risk Score and Plan: Propofol infusion and TIVA  Airway Management Planned: Natural Airway and Nasal CPAP  Additional Equipment:   Intra-op Plan:   Post-operative Plan:   Informed Consent: I have reviewed the patients History and Physical, chart, labs and discussed the procedure including the risks, benefits and alternatives for the proposed anesthesia with the patient or authorized representative who has indicated his/her understanding and acceptance.     Dental Advisory Given  Plan Discussed with: Anesthesiologist, CRNA and Surgeon  Anesthesia Plan Comments: (Patient consented for risks of anesthesia including but not limited to:  - adverse reactions to medications - risk of intubation if required - damage to teeth, lips or other oral mucosa - sore throat or hoarseness - Damage to heart, brain, lungs or loss of life  Patient voiced understanding.)        Anesthesia Quick Evaluation

## 2018-06-14 NOTE — Discharge Instructions (Signed)
Pancreatitis Eating Plan Pancreatitis is when your pancreas becomes irritated and swollen (inflamed). The pancreas is a small organ located behind your stomach. It helps your body digest food and regulate your blood sugar. Pancreatitis can affect how your body digests food, especially foods with fat. You may also have other symptoms such as abdominal pain or nausea. When you have pancreatitis, following a low-fat eating plan may help you manage symptoms and recover more quickly. Work with your health care provider or a diet and nutrition specialist (dietitian) to create an eating plan that is right for you. What are tips for following this plan? Reading food labels Use the information on food labels to help keep track of how much fat you eat:  Check the serving size.  Look for the amount of total fat in grams (g) in one serving. ? Low-fat foods have 3 g of fat or less per serving. ? Fat-free foods have 0.5 g of fat or less per serving.  Keep track of how much fat you eat based on how many servings you eat. ? For example, if you eat two servings, the amount of fat you eat will be two times what is listed on the label. Shopping   Buy low-fat or nonfat foods, such as: ? Fresh, frozen, or canned fruits and vegetables. ? Grains, including pasta, bread, and rice. ? Lean meat, poultry, fish, and other protein foods. ? Low-fat or nonfat dairy.  Avoid buying bakery products and other sweets made with whole milk, butter, and eggs.  Avoid buying snack foods with added fat, such as anything with butter or cheese flavoring. Cooking  Remove skin from poultry, and remove extra fat from meat.  Limit the amount of fat and oil you use to 6 teaspoons or less per day.  Cook using low-fat methods, such as boiling, broiling, grilling, steaming, or baking.  Use spray oil to cook. Add fat-free chicken broth to add flavor and moisture.  Avoid adding cream to thicken soups or sauces. Use other thickeners  such as corn starch or tomato paste. Meal planning   Eat a low-fat diet as told by your dietitian. For most people, this means having no more than 55-65 grams of fat each day.  Eat small, frequent meals throughout the day. For example, you may have 5-6 small meals instead of 3 large meals.  Drink enough fluid to keep your urine pale yellow.  Do not drink alcohol. Talk to your health care provider if you need help stopping.  Limit how much caffeine you have, including black coffee, black and green tea, caffeinated soft drinks, and energy drinks. General information  Let your health care provider or dietitian know if you have unplanned weight loss on this eating plan.  You may be instructed to follow a clear liquid diet during a flare of symptoms. Talk with your health care provider about how to manage your diet during symptoms of a flare.  Take any vitamins or supplements as told by your health care provider.  Work with a Microbiologist, especially if you have other conditions such as obesity or diabetes mellitus. What foods should I avoid? Fruits Fried fruits. Fruits served with butter or cream. Vegetables Fried vegetables. Vegetables cooked with butter, cheese, or cream. Grains Biscuits, waffles, donuts, pastries, and croissants. Pies and cookies. Butter-flavored popcorn. Regular crackers. Meats and other protein foods Fatty cuts of meat. Poultry with skin. Organ meats. Bacon, sausage, and cold cuts. Whole eggs. Nuts and nut butters. Dairy Whole and  2% milk. Whole milk yogurt. Whole milk ice cream. Cream and half-and-half. Cream cheese. Sour cream. Cheese. Beverages Wine, beer, and liquor. The items listed above may not be a complete list of foods and beverages to avoid. Contact a dietitian for more information. Summary  Pancreatitis can affect how your body digests food, especially foods with fat.  When you have pancreatitis, it is recommended that you follow a low-fat eating  plan to help you recover more quickly and manage symptoms. For most people, this means limiting fat to no more than 55-65 grams per day.  Do not drink alcohol. Limit the amount of caffeine you have, and drink enough fluid to keep your urine pale yellow. This information is not intended to replace advice given to you by your health care provider. Make sure you discuss any questions you have with your health care provider. Document Released: 07/18/2017 Document Revised: 07/18/2017 Document Reviewed: 07/18/2017 Elsevier Interactive Patient Education  2019 [employer] Heart-healthy meal planning includes:  Eating less unhealthy fats.  Eating more healthy fats.  Making other changes in your diet. Talk with your doctor or a diet specialist (dietitian) to create an eating plan that is right for you. What is my plan? Your doctor may recommend an eating plan that includes:  Total fat: ______% or less of total calories a day.  Saturated fat: ______% or less of total calories a day.  Cholesterol: less than _________mg a day. What are tips for following this plan? Cooking Avoid frying your food. Try to bake, boil, grill, or broil it instead. You can also reduce fat by:  Removing the skin from poultry.  Removing all visible fats from meats.  Steaming vegetables in water or broth. Meal planning   At meals, divide your plate into four equal parts: ? Fill one-half of your plate with vegetables and green salads. ? Fill one-fourth of your plate with whole grains. ? Fill one-fourth of your plate with lean protein foods.  Eat 4-5 servings of vegetables per day. A serving of vegetables is: ? 1 cup of raw or cooked vegetables. ? 2 cups of raw leafy greens.  Eat 4-5 servings of fruit per day. A serving of fruit is: ? 1 medium whole fruit. ?  cup of dried fruit. ?  cup of fresh, frozen, or canned fruit. ?  cup of 100% fruit juice.  Eat more foods that have  soluble fiber. These are apples, broccoli, carrots, beans, peas, and barley. Try to get 20-30 g of fiber per day.  Eat 4-5 servings of nuts, legumes, and seeds per week: ? 1 serving of dried beans or legumes equals  cup after being cooked. ? 1 serving of nuts is  cup. ? 1 serving of seeds equals 1 tablespoon. General information  Eat more home-cooked food. Eat less restaurant, buffet, and fast food.  Limit or avoid alcohol.  Limit foods that are high in starch and sugar.  Avoid fried foods.  Lose weight if you are overweight.  Keep track of how much salt (sodium) you eat. This is important if you have high blood pressure. Ask your doctor to tell you more about this.  Try to add vegetarian meals each week. Fats  Choose healthy fats. These include olive oil and canola oil, flaxseeds, walnuts, almonds, and seeds.  Eat more omega-3 fats. These include salmon, mackerel, sardines, tuna, flaxseed oil, and ground flaxseeds. Try to eat fish at least 2 times each week.  Check food labels.  Avoid foods with trans fats or high amounts of saturated fat.  Limit saturated fats. ? These are often found in animal products, such as meats, butter, and cream. ? These are also found in plant foods, such as palm oil, palm kernel oil, and coconut oil.  Avoid foods with partially hydrogenated oils in them. These have trans fats. Examples are stick margarine, some tub margarines, cookies, crackers, and other baked goods. What foods can I eat? Fruits All fresh, canned (in natural juice), or frozen fruits. Vegetables Fresh or frozen vegetables (raw, steamed, roasted, or grilled). Green salads. Grains Most grains. Choose whole wheat and whole grains most of the time. Rice and pasta, including brown rice and pastas made with whole wheat. Meats and other proteins Lean, well-trimmed beef, veal, pork, and lamb. Chicken and [place] without skin. All fish and shellfish. Wild duck, rabbit, pheasant, and  venison. Egg whites or low-cholesterol egg substitutes. Dried beans, peas, lentils, and tofu. Seeds and most nuts. Dairy Low-fat or nonfat cheeses, including ricotta and mozzarella. Skim or 1% milk that is liquid, powdered, or evaporated. Buttermilk that is made with low-fat milk. Nonfat or low-fat yogurt. Fats and oils Non-hydrogenated (trans-free) margarines. Vegetable oils, including soybean, sesame, sunflower, olive, peanut, safflower, corn, canola, and cottonseed. Salad dressings or mayonnaise made with a vegetable oil. Beverages Mineral water. Coffee and tea. Diet carbonated beverages. Sweets and desserts Sherbet, gelatin, and fruit ice. Small amounts of dark chocolate. Limit all sweets and desserts. Seasonings and condiments All seasonings and condiments. The items listed above may not be a complete list of foods and drinks you can eat. Contact a dietitian for more options. What foods should I avoid? Fruits Canned fruit in heavy syrup. Fruit in cream or butter sauce. Fried fruit. Limit coconut. Vegetables Vegetables cooked in cheese, cream, or butter sauce. Fried vegetables. Grains Breads that are made with saturated or trans fats, oils, or whole milk. Croissants. Sweet rolls. Donuts. High-fat crackers, such as cheese crackers. Meats and other proteins Fatty meats, such as hot dogs, ribs, sausage, bacon, rib-eye roast or steak. High-fat deli meats, such as salami and bologna. Caviar. Domestic duck and goose. Organ meats, such as liver. Dairy Cream, sour cream, cream cheese, and creamed cottage cheese. Whole-milk cheeses. Whole or 2% milk that is liquid, evaporated, or condensed. Whole buttermilk. Cream sauce or high-fat cheese sauce. Yogurt that is made from whole milk. Fats and oils Meat fat, or shortening. Cocoa butter, hydrogenated oils, palm oil, coconut oil, palm kernel oil. Solid fats and shortenings, including bacon fat, salt pork, lard, and butter. Nondairy cream substitutes.  Salad dressings with cheese or sour cream. Beverages Regular sodas and juice drinks with added sugar. Sweets and desserts Frosting. Pudding. Cookies. Cakes. Pies. Milk chocolate or white chocolate. Buttered syrups. Full-fat ice cream or ice cream drinks. The items listed above may not be a complete list of foods and drinks to avoid. Contact a dietitian for more information. Summary  Heart-healthy meal planning includes eating less unhealthy fats, eating more healthy fats, and making other changes in your diet.  Eat a balanced diet. This includes fruits and vegetables, low-fat or nonfat dairy, lean protein, nuts and legumes, whole grains, and heart-healthy oils and fats. This information is not intended to replace advice given to you by your health care provider. Make sure you discuss any questions you have with your health care provider. Document Released: 10/11/2011 Document Revised: 05/19/2017 Document Reviewed: 05/19/2017 Elsevier Interactive Patient Education  2019 [employer]. Discharge to  home

## 2018-06-15 ENCOUNTER — Encounter: Payer: Self-pay | Admitting: Internal Medicine

## 2018-06-15 ENCOUNTER — Telehealth: Payer: Self-pay

## 2018-06-15 NOTE — Telephone Encounter (Signed)
EUS report noted. Recommendation from Dr. Mont Dutton:  Recommend a short interval repeat CT or MRI scan in 4 weeks after pancreatitis has healed. Repeat EUS if mass is seen on follow-up scan, however this will need to be performed at [facility] given the need for same day ERCP for bile duct stent removal to allow optimal visualization of the pancreas head. Also will require documentation by his hematologist requiring optimal management of factor replacement if a fine needle biopsy is necessary at that time.  Navigation will sign off and follow at a distance. He has follow up with Dr. Gustavo Lah 06/22/18. Notified Dr. Gustavo Lah to let me know if I can be of further assistance in the future with repeat EUS, med onc referral, etc..Marland Kitchen

## 2018-06-15 NOTE — Anesthesia Postprocedure Evaluation (Signed)
Anesthesia Post Note  Patient: Shawn Cardenas  Procedure(s) Performed: FULL UPPER ENDOSCOPIC ULTRASOUND (EUS) RADIAL (N/A )  Patient location during evaluation: Endoscopy Anesthesia Type: General Level of consciousness: awake and alert Pain management: pain level controlled Vital Signs Assessment: post-procedure vital signs reviewed and stable Respiratory status: spontaneous breathing, nonlabored ventilation, respiratory function stable and patient connected to nasal cannula oxygen Cardiovascular status: blood pressure returned to baseline and stable Postop Assessment: no apparent nausea or vomiting Anesthetic complications: no     Last Vitals:  Vitals:   06/14/18 1408 06/14/18 1438  BP: (!) 97/49 128/75  Pulse: 78 77  Resp: (!) 7 12  Temp:    SpO2: 95% 98%    Last Pain:  Vitals:   06/14/18 1438  TempSrc:   PainSc: 0-No pain                 Precious Haws Piscitello

## 2018-06-28 ENCOUNTER — Ambulatory Visit: Payer: Medicare Other | Admitting: Gastroenterology

## 2018-07-02 ENCOUNTER — Other Ambulatory Visit: Payer: Self-pay

## 2018-07-02 ENCOUNTER — Other Ambulatory Visit
Admission: RE | Admit: 2018-07-02 | Discharge: 2018-07-02 | Disposition: A | Discharge status: Home / Self Care | Payer: Medicare Other | Attending: Gastroenterology | Admitting: Gastroenterology

## 2018-07-02 ENCOUNTER — Encounter: Payer: Self-pay | Admitting: Gastroenterology

## 2018-07-02 ENCOUNTER — Ambulatory Visit (INDEPENDENT_AMBULATORY_CARE_PROVIDER_SITE_OTHER): Payer: Medicare Other | Admitting: Gastroenterology

## 2018-07-02 VITALS — BP 109/69 | HR 88 | Ht 68.0 in

## 2018-07-02 DIAGNOSIS — K7460 Unspecified cirrhosis of liver: Secondary | ICD-10-CM | POA: Insufficient documentation

## 2018-07-02 DIAGNOSIS — C250 Malignant neoplasm of head of pancreas: Secondary | ICD-10-CM

## 2018-07-02 DIAGNOSIS — I10 Essential (primary) hypertension: Secondary | ICD-10-CM | POA: Insufficient documentation

## 2018-07-02 DIAGNOSIS — Z4659 Encounter for fitting and adjustment of other gastrointestinal appliance and device: Secondary | ICD-10-CM

## 2018-07-02 NOTE — Progress Notes (Signed)
Primary Care Physician: [name], MD  Primary Gastroenterologist:  Dr. [name]  Chief Complaint  Patient presents with  . Hospitalization Follow-up    HPI: Shawn Cardenas is a 63 y.o. male here for follow-up after being discharged from the hospital.  The patient was in the hospital with jaundice.  The patient underwent an ERCP with a stricture seen.  The brushing of the stricture showed the patient to have cell suspicious for cancer.  The patient underwent an EUS with findings showing an amorphic lesion in the head of the pancreas but no distinct mass.  There is also some enlarged lymph nodes that were thought to be reactive.  No biopsies were taken.  The patient had 2 stents placed at the time of the ERCP and now comes in for follow-up after having the procedures.  The patient states he had hepatitis C and was treated many years ago.  The patient has been found to have a nodular liver consistent with cirrhosis.  Current Outpatient Medications  Medication Sig Dispense Refill  . albuterol (PROVENTIL HFA;VENTOLIN HFA) 108 (90 Base) MCG/ACT inhaler Inhale into the lungs.    . Antihemophilic Factor, Recomb, 7858 UNITS KIT Inject 1 kit into the vein every other day. Halixate or Kogenate    . DULoxetine (CYMBALTA) 60 MG capsule Take 60 mg by mouth daily.     Marland Kitchen esomeprazole (NEXIUM) 40 MG capsule Take 40 mg by mouth daily.     Marland Kitchen FLUoxetine (PROZAC) 20 MG capsule Take 20 mg by mouth daily.    . furosemide (LASIX) 20 MG tablet Take 1 tablet (20 mg total) by mouth 2 (two) times daily. 60 tablet 0  . gabapentin (NEURONTIN) 300 MG capsule Take 300 mg by mouth 3 (three) times daily.     Marland Kitchen glipiZIDE (GLUCOTROL) 5 MG tablet Take 1 tablet (5 mg total) by mouth daily before breakfast. 30 tablet 0  . heparin lock flush 100 UNIT/ML SOLN injection Flush port with Heparin(100unit/ml) 75m flush after177mflush  of NS flush with every factor port infusion and PRN.    . Marland Kitchenisinopril (PRINIVIL,ZESTRIL)  10 MG tablet Take 1 tablet by mouth daily.    . Marland KitchenARCAN 4 MG/0.1ML LIQD nasal spray kit Place 1 spray into both nostrils every 5 (five) minutes as needed (overdose).     . Marland KitchenxyCODONE (OXY IR/ROXICODONE) 5 MG immediate release tablet Take 1 tablet (5 mg total) by mouth every 4 (four) hours as needed for moderate pain. 30 tablet 0  . oxyCODONE (OXYCONTIN) 60 MG 12 hr tablet Take 60 mg by mouth every 12 (twelve) hours. 14 tablet 0  . pantoprazole (PROTONIX) 40 MG tablet     . pravastatin (PRAVACHOL) 40 MG tablet Take 1 tablet (40 mg total) by mouth daily. 30 tablet 3  . sodium chloride flush 0.9 % SOLN injection NS Flush port before and after factor infusion and PRN    . tamsulosin (FLOMAX) 0.4 MG CAPS capsule Take 0.4 mg by mouth daily.      No current facility-administered medications for this visit.     Allergies as of 07/02/2018 - Review Complete 07/02/2018  Allergen Reaction Noted  . Aspirin Other (See Comments) 09/10/2014  . Other Other (See Comments) 07/10/2015    ROS:  General: Negative for anorexia, weight loss, fever, chills, fatigue, weakness. ENT: Negative for hoarseness, difficulty swallowing , nasal congestion. CV: Negative for chest pain, angina, palpitations, dyspnea on exertion, peripheral edema.  Respiratory: Negative for dyspnea at  rest, dyspnea on exertion, cough, sputum, wheezing.  GI: See history of present illness. GU:  Negative for dysuria, hematuria, urinary incontinence, urinary frequency, nocturnal urination.  Endo: Negative for unusual weight change.    Physical Examination:   BP 109/69   Pulse 88   Ht '5\' 8"'$  (1.727 m)   BMI 34.97 kg/m   General: Well-nourished, well-developed in no acute distress.  Eyes: No icterus. Conjunctivae pink. Neuro: Alert and oriented x 3.  Grossly intact. Skin: Warm and dry, no jaundice.   Psych: Alert and cooperative, normal mood and affect.  Labs:    Imaging Studies: Dg Chest 2 View  Result Date: 06/04/2018 CLINICAL  DATA:  History of pancreatitis and shortness of Breath EXAM: CHEST - 2 VIEW COMPARISON:  05/25/2018 FINDINGS: Cardiac shadow is stable. Left chest wall port is again seen. Diffuse vascular congestion and interstitial edema is again identified and stable. Small effusions are again seen bilaterally. No acute bony abnormality is noted. IMPRESSION: Changes of CHF with edema and effusions. Electronically Signed   By: Inez Catalina M.D.   On: 06/04/2018 00:41   US Abdomen Limited  Result Date: 06/08/2018 CLINICAL DATA:  63 year old with abdominal pain. Evaluate for ascites and diagnostic paracentesis. EXAM: LIMITED ABDOMEN ULTRASOUND FOR ASCITES TECHNIQUE: Limited ultrasound survey for ascites was performed in all four abdominal quadrants. COMPARISON:  Abdominal CT 05/22/2018 FINDINGS: There is a right pleural effusion. No significant fluid around the liver. Minimal fluid in the right lower quadrant. Minimal fluid in the left lower quadrant. Trace perisplenic fluid. Evidence for small left pleural effusion. IMPRESSION: Minimal ascites.  Not enough fluid for a safe paracentesis. Bilateral pleural effusions. Electronically Signed   By: Markus Daft M.D.   On: 06/08/2018 15:12   Dg C-arm 1-60 Min-no Report  Result Date: 06/04/2018 Fluoroscopy was utilized by the requesting physician.  No radiographic interpretation.    Assessment and Plan:   Shawn Cardenas is a 63 y.o. 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  The patient had 2 stents placed in the common bile duct with an ERCP and a subsequent EUS.  At the time of EUS there is no biopsies taken due to no distinct mass seen.  The patient was recommended to have a repeat MRI with a repeat EUS at [facility] if a mass was seen thereby they can take the stents out via ERCP and then do the EUS and replace the stents as needed.  The patient will have his  blood sent off for CA 19-9 for pancreatic cancer will also have his blood sent for alpha-fetoprotein due to his history of cirrhosis and findings on MRI and EUS.  The patient will have repeat brushings of his common bile duct at the time of his stent change.  The patient has been explained the plan and agrees with it.    [name], MD. Marval Regal   Note: This dictation was prepared with Dragon dictation along with smaller phrase technology. Any transcriptional errors that result from this process are unintentional.

## 2018-07-03 LAB — AFP TUMOR MARKER: AFP, Serum, Tumor Marker: 1.2 ng/mL (ref 0.0–8.3)

## 2018-07-03 LAB — CANCER ANTIGEN 19-9: CA 19-9: 92 U/mL — ABNORMAL HIGH (ref 0–35)

## 2018-07-06 ENCOUNTER — Telehealth: Payer: Self-pay

## 2018-07-06 NOTE — Telephone Encounter (Signed)
-----   Message from [name], MD sent at 07/03/2018  6:00 PM EDT ----- Let the patient know that one of his tumor markers was higher than normal thereby taking the need for repeat ERCP with brushings and Stent change all that more important.

## 2018-07-06 NOTE — Telephone Encounter (Signed)
Pt notified of lab results and to keep ERCP appt in April for brushings and stent change.

## 2018-07-11 ENCOUNTER — Other Ambulatory Visit: Payer: Self-pay | Admitting: *Deleted

## 2018-07-11 ENCOUNTER — Telehealth: Payer: Self-pay | Admitting: Cardiovascular Disease

## 2018-07-11 MED ORDER — PRAVASTATIN SODIUM 40 MG PO TABS: 40.0000 mg | ORAL_TABLET | Freq: Every day | ORAL | 0 refills | Status: DC

## 2018-07-11 NOTE — Telephone Encounter (Signed)
Requested Prescriptions   Signed Prescriptions Disp Refills  . pravastatin (PRAVACHOL) 40 MG tablet 90 tablet 0    Sig: Take 1 tablet (40 mg total) by mouth daily.    Authorizing Provider: Minna Merritts    Ordering User: Britt Bottom

## 2018-07-11 NOTE — Telephone Encounter (Signed)
°*  STAT* If patient is at the pharmacy, call can be transferred to refill team.   1. Which medications need to be refilled? (please list name of each medication and dose if known)  Pravastatin 40 mg po q d   2. Which pharmacy/location (including street and city if local pharmacy) is medication to be sent to?  [facility]   3. Do they need a 30 day or 90 day supply? [place]

## 2018-07-24 ENCOUNTER — Other Ambulatory Visit
Admission: RE | Admit: 2018-07-24 | Discharge: 2018-07-24 | Disposition: A | Discharge status: Home / Self Care | Payer: Medicare Other | Source: Ambulatory Visit | Attending: Gastroenterology | Admitting: Gastroenterology

## 2018-07-24 DIAGNOSIS — R197 Diarrhea, unspecified: Secondary | ICD-10-CM | POA: Diagnosis present

## 2018-07-24 DIAGNOSIS — R194 Change in bowel habit: Secondary | ICD-10-CM | POA: Insufficient documentation

## 2018-07-24 LAB — GASTROINTESTINAL PANEL BY PCR, STOOL (REPLACES STOOL CULTURE)

## 2018-07-24 LAB — C DIFFICILE QUICK SCREEN W PCR REFLEX
C Diff antigen: NEGATIVE
C Diff interpretation: NOT DETECTED
C Diff toxin: NEGATIVE

## 2018-07-26 LAB — PANCREATIC ELASTASE, FECAL: Pancreatic Elastase-1, Stool: <50 ug Elast./g — ABNORMAL LOW (ref >200)

## 2018-08-01 ENCOUNTER — Telehealth: Payer: Self-pay | Admitting: Gastroenterology

## 2018-08-01 NOTE — Telephone Encounter (Signed)
Shawn Cardenas called for patient & l/m on v/m that they would like for him to be seen in the office or move the procedure 08-14-18(upper endoscopy up sooner. He saw another doctor & they put him on lfurosemide (LASIX) 20 MG tablet because of stomach & legs swelling. Please call & advise.

## 2018-08-01 NOTE — Telephone Encounter (Signed)
LVM for pt to return my call.

## 2018-08-02 NOTE — Telephone Encounter (Signed)
Left vm again for pt to return my call.  

## 2018-08-14 ENCOUNTER — Encounter
Admission: RE | Disposition: A | Discharge status: Home / Self Care | Payer: Self-pay | Source: Ambulatory Visit | Attending: Gastroenterology

## 2018-08-14 ENCOUNTER — Encounter: Payer: Self-pay | Admitting: Anesthesiology

## 2018-08-14 ENCOUNTER — Ambulatory Visit: Payer: Medicare Other

## 2018-08-14 ENCOUNTER — Ambulatory Visit: Payer: Medicare Other | Admitting: Anesthesiology

## 2018-08-14 ENCOUNTER — Ambulatory Visit
Admission: RE | Admit: 2018-08-14 | Discharge: 2018-08-14 | Disposition: A | Discharge status: Home / Self Care | Payer: Medicare Other | Source: Ambulatory Visit | Attending: Gastroenterology | Admitting: Gastroenterology

## 2018-08-14 DIAGNOSIS — Z888 Allergy status to other drugs, medicaments and biological substances status: Secondary | ICD-10-CM | POA: Insufficient documentation

## 2018-08-14 DIAGNOSIS — Z6830 Body mass index (BMI) 30.0-30.9, adult: Secondary | ICD-10-CM | POA: Diagnosis not present

## 2018-08-14 DIAGNOSIS — Z8249 Family history of ischemic heart disease and other diseases of the circulatory system: Secondary | ICD-10-CM | POA: Insufficient documentation

## 2018-08-14 DIAGNOSIS — Z7984 Long term (current) use of oral hypoglycemic drugs: Secondary | ICD-10-CM | POA: Insufficient documentation

## 2018-08-14 DIAGNOSIS — I4510 Unspecified right bundle-branch block: Secondary | ICD-10-CM | POA: Insufficient documentation

## 2018-08-14 DIAGNOSIS — Z87891 Personal history of nicotine dependence: Secondary | ICD-10-CM | POA: Diagnosis not present

## 2018-08-14 DIAGNOSIS — Z9049 Acquired absence of other specified parts of digestive tract: Secondary | ICD-10-CM | POA: Diagnosis not present

## 2018-08-14 DIAGNOSIS — Z886 Allergy status to analgesic agent status: Secondary | ICD-10-CM | POA: Insufficient documentation

## 2018-08-14 DIAGNOSIS — K759 Inflammatory liver disease, unspecified: Secondary | ICD-10-CM | POA: Diagnosis not present

## 2018-08-14 DIAGNOSIS — D66 Hereditary factor VIII deficiency: Secondary | ICD-10-CM | POA: Diagnosis not present

## 2018-08-14 DIAGNOSIS — K219 Gastro-esophageal reflux disease without esophagitis: Secondary | ICD-10-CM | POA: Insufficient documentation

## 2018-08-14 DIAGNOSIS — F419 Anxiety disorder, unspecified: Secondary | ICD-10-CM | POA: Diagnosis not present

## 2018-08-14 DIAGNOSIS — E669 Obesity, unspecified: Secondary | ICD-10-CM | POA: Insufficient documentation

## 2018-08-14 DIAGNOSIS — Z4659 Encounter for fitting and adjustment of other gastrointestinal appliance and device: Secondary | ICD-10-CM

## 2018-08-14 DIAGNOSIS — C240 Malignant neoplasm of extrahepatic bile duct: Secondary | ICD-10-CM | POA: Insufficient documentation

## 2018-08-14 DIAGNOSIS — Z79899 Other long term (current) drug therapy: Secondary | ICD-10-CM | POA: Diagnosis not present

## 2018-08-14 DIAGNOSIS — G709 Myoneural disorder, unspecified: Secondary | ICD-10-CM | POA: Insufficient documentation

## 2018-08-14 DIAGNOSIS — F329 Major depressive disorder, single episode, unspecified: Secondary | ICD-10-CM | POA: Insufficient documentation

## 2018-08-14 DIAGNOSIS — M1990 Unspecified osteoarthritis, unspecified site: Secondary | ICD-10-CM | POA: Diagnosis not present

## 2018-08-14 DIAGNOSIS — I10 Essential (primary) hypertension: Secondary | ICD-10-CM | POA: Insufficient documentation

## 2018-08-14 DIAGNOSIS — K831 Obstruction of bile duct: Secondary | ICD-10-CM

## 2018-08-14 DIAGNOSIS — E119 Type 2 diabetes mellitus without complications: Secondary | ICD-10-CM | POA: Diagnosis not present

## 2018-08-14 HISTORY — PX: ERCP: SHX5425

## 2018-08-14 LAB — GLUCOSE, CAPILLARY
Glucose-Capillary: 114 mg/dL — ABNORMAL HIGH (ref 70–99)
Glucose-Capillary: 60 mg/dL — ABNORMAL LOW (ref 70–99)

## 2018-08-14 SURGERY — ERCP, WITH INTERVENTION IF INDICATED
Anesthesia: General

## 2018-08-14 MED ORDER — HEPARIN SOD (PORK) LOCK FLUSH 100 UNIT/ML IV SOLN
INTRAVENOUS | Status: AC
  Filled 2018-08-14: qty 5

## 2018-08-14 MED ORDER — PHENYLEPHRINE HCL (PRESSORS) 10 MG/ML IV SOLN
INTRAVENOUS | Status: DC | PRN
  Administered 2018-08-14: 100 ug via INTRAVENOUS
  Administered 2018-08-14: 200 ug via INTRAVENOUS
  Administered 2018-08-14: 100 ug via INTRAVENOUS

## 2018-08-14 MED ORDER — FENTANYL CITRATE (PF) 100 MCG/2ML IJ SOLN
INTRAMUSCULAR | Status: DC | PRN
  Administered 2018-08-14 (×2): 50 ug via INTRAVENOUS

## 2018-08-14 MED ORDER — DEXTROSE-NACL 5-0.9 % IV SOLN
INTRAVENOUS | Status: DC
  Administered 2018-08-14: 13:00:00 via INTRAVENOUS

## 2018-08-14 MED ORDER — ONDANSETRON HCL 4 MG/2ML IJ SOLN: 4.0000 mg | Freq: Once | INTRAMUSCULAR | Status: DC | PRN

## 2018-08-14 MED ORDER — DEXTROSE 50 % IV SOLN
INTRAVENOUS | Status: AC
  Filled 2018-08-14: qty 50

## 2018-08-14 MED ORDER — EPHEDRINE SULFATE 50 MG/ML IJ SOLN
INTRAMUSCULAR | Status: AC
  Filled 2018-08-14: qty 1

## 2018-08-14 MED ORDER — ONDANSETRON HCL 4 MG/2ML IJ SOLN
INTRAMUSCULAR | Status: DC | PRN
  Administered 2018-08-14: 4 mg via INTRAVENOUS

## 2018-08-14 MED ORDER — SODIUM CHLORIDE 0.9 % IV SOLN
INTRAVENOUS | Status: DC
  Administered 2018-08-14: 11:00:00 via INTRAVENOUS

## 2018-08-14 MED ORDER — FENTANYL CITRATE (PF) 100 MCG/2ML IJ SOLN: 25.0000 ug | INTRAMUSCULAR | Status: DC | PRN

## 2018-08-14 MED ORDER — LIDOCAINE HCL (CARDIAC) PF 100 MG/5ML IV SOSY
PREFILLED_SYRINGE | INTRAVENOUS | Status: DC | PRN
  Administered 2018-08-14: 100 mg via INTRAVENOUS

## 2018-08-14 MED ORDER — DEXTROSE 50 % IV SOLN
25.0000 mL | Freq: Once | INTRAVENOUS | Status: AC
  Administered 2018-08-14: 13:00:00 25 mL via INTRAVENOUS

## 2018-08-14 MED ORDER — ONDANSETRON HCL 4 MG/2ML IJ SOLN
INTRAMUSCULAR | Status: AC
  Filled 2018-08-14: qty 2

## 2018-08-14 MED ORDER — FENTANYL CITRATE (PF) 100 MCG/2ML IJ SOLN
INTRAMUSCULAR | Status: AC
  Filled 2018-08-14: qty 2

## 2018-08-14 MED ORDER — EPHEDRINE SULFATE 50 MG/ML IJ SOLN
INTRAMUSCULAR | Status: DC | PRN
  Administered 2018-08-14: 5 mg via INTRAVENOUS
  Administered 2018-08-14: 10 mg via INTRAVENOUS

## 2018-08-14 MED ORDER — PROPOFOL 10 MG/ML IV BOLUS
INTRAVENOUS | Status: DC | PRN
  Administered 2018-08-14: 150 mg via INTRAVENOUS

## 2018-08-14 MED ORDER — SUCCINYLCHOLINE CHLORIDE 20 MG/ML IJ SOLN
INTRAMUSCULAR | Status: DC | PRN
  Administered 2018-08-14: 100 mg via INTRAVENOUS

## 2018-08-14 MED ORDER — HEPARIN SOD (PORK) LOCK FLUSH 100 UNIT/ML IV SOLN: 500.0000 [IU] | INTRAVENOUS | Status: DC

## 2018-08-14 NOTE — Anesthesia Preprocedure Evaluation (Signed)
Anesthesia Evaluation  Patient identified by MRN, date of birth, ID band Patient awake    Reviewed: Allergy & Precautions, NPO status , Patient's Chart, lab work & pertinent test results, reviewed documented beta blocker date and time   Airway Mallampati: III  TM Distance: >3 FB     Dental  (+) Chipped   Pulmonary former smoker,           Cardiovascular hypertension, Pt. 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 Obese. Smokes. EF 50-55. EKG shows Rbbb.  Reproductive/Obstetrics                             Anesthesia Physical Anesthesia Plan  ASA: III  Anesthesia Plan: General   Post-op Pain Management:    Induction: Intravenous  PONV Risk Score and Plan:   Airway Management Planned:   Additional Equipment:   Intra-op Plan:   Post-operative Plan:   Informed Consent: I have reviewed the patients History and Physical, chart, labs and discussed the procedure including the risks, benefits and alternatives for the proposed anesthesia with the patient or authorized representative who has indicated his/her understanding and acceptance.       Plan Discussed with: CRNA  Anesthesia Plan Comments:         Anesthesia Quick Evaluation

## 2018-08-14 NOTE — Op Note (Signed)
[facility] Patient Name: Shawn Cardenas Procedure Date: 08/14/2018 11:51 AM MRN: [id number] Account #: [id number] Date of Birth: 1956-01-20 Admit Type: Outpatient Age: 63 Room: [facility] ENDO ROOM 4 Gender: Male Note Status: Finalized Procedure:            ERCP Indications:          Common bile duct stricture, Stent change Providers:            [name] MD, MD Medicines:            General Anesthesia Complications:        No immediate complications. Procedure:            Pre-Anesthesia Assessment:                       - Prior to the procedure, a History and Physical was                        performed, and patient medications and allergies were                        reviewed. The patient's tolerance of previous                        anesthesia was also reviewed. The risks and benefits of                        the procedure and the sedation options and risks were                        discussed with the patient. All questions were                        answered, and informed consent was obtained. Prior                        Anticoagulants: The patient has taken no previous                        anticoagulant or antiplatelet agents except for                        aspirin. ASA Grade Assessment: III - A patient with                        severe systemic disease. After reviewing the risks and                        benefits, the patient was deemed in satisfactory                        condition to undergo the procedure.                       After obtaining informed consent, the scope was passed                        under direct vision. Throughout the procedure, the  patient's blood pressure, pulse, and oxygen saturations                        were monitored continuously. The Duodenoscope was                        introduced through the mouth, and used to inject                        contrast into and used to  inject contrast into the bile                        duct. The ERCP was accomplished without difficulty. The                        patient tolerated the procedure well. Findings:      A biliary stent was visible on the scout film. A biliary sphincterotomy       had been performed. The sphincterotomy appeared open. Two plastic stents       originating in the biliary tree were emerging from the major papilla.       Two stents were removed from the biliary tree using a snare. The bile       duct was deeply cannulated with the short-nosed traction sphincterotome.       Contrast was injected. I personally interpreted the bile duct images.       There was brisk flow of contrast through the ducts. Image quality was       excellent. Contrast extended to the entire biliary tree. A wire was       passed into the biliary tree. Cells for cytology were obtained by       brushing in the lower third of the main bile duct. One 10 Fr by 5 cm       transpapillary plastic stent with a single external flap and a single       internal flap was placed 4 cm into the common bile duct. Bile flowed       through the stent. The stent was in good position. Impression:           - Prior biliary sphincterotomy appeared open.                       - Two stents from the biliary tree were seen in the                        major papilla.                       - Two stents were removed from the biliary tree.                       - Cells for cytology obtained in the lower third of the                        main duct.                       - One plastic stent was placed into the common bile  duct. Recommendation:       - Discharge patient to home.                       - Resume previous diet.                       - Refer to an oncologist.                       - Await cytology results.                       - Repeat ERCP in 3 months to exchange stent. Procedure Code(s):    --- Professional ---                        [phone], Endoscopic retrograde cholangiopancreatography                        (ERCP); with removal and exchange of stent(s), biliary                        or pancreatic duct, including pre- and post-dilation                        and guide wire passage, when performed, including                        sphincterotomy, when performed, each stent exchanged                       [phone], Endoscopic catheterization of the biliary ductal                        system, radiological supervision and interpretation Diagnosis Code(s):    --- Professional ---                       K83.1, Obstruction of bile duct                       Z46.59, Encounter for fitting and adjustment of other                        gastrointestinal appliance and device CPT copyright 2019 [employer]. All rights reserved. The codes documented in this report are preliminary and upon coder review may  be revised to meet current compliance requirements. [name] MD, MD 08/14/2018 12:37:38 PM This report has been signed electronically. Number of Addenda: 0 Note Initiated On: 08/14/2018 11:51 AM      [facility]

## 2018-08-14 NOTE — H&P (View-Only) (Signed)
Shawn Lame, MD [address] [place], [place] [address] Phone:[phone] Fax : [phone]  Primary Care Physician:  [name], MD Primary Gastroenterologist:  Dr. Allen Norris  Pre-Procedure History & Physical: HPI:  Shawn Cardenas is a 63 y.o. male is here for an ERCP.   Past Medical History:  Diagnosis Date  . Anxiety   . Arthritis   . Blood transfusion without reported diagnosis   . Clotting disorder ([place])   . Depression   . Diabetes mellitus without complication ([place])   . GERD (gastroesophageal reflux disease)   . Hypertension   . Neuromuscular disorder [facility])     Past Surgical History:  Procedure Laterality Date  . CHOLECYSTECTOMY    . ERCP N/A 05/29/2018   Procedure: ENDOSCOPIC RETROGRADE CHOLANGIOPANCREATOGRAPHY (ERCP);  Surgeon: Shawn Lame, MD;  Location: [facility] ENDOSCOPY;  Service: Endoscopy;  Laterality: N/A;  . ERCP N/A 06/04/2018   Procedure: ENDOSCOPIC RETROGRADE CHOLANGIOPANCREATOGRAPHY (ERCP);  Surgeon: Shawn Lame, MD;  Location: [facility] ENDOSCOPY;  Service: Endoscopy;  Laterality: N/A;  . EUS N/A 06/14/2018   Procedure: FULL UPPER ENDOSCOPIC ULTRASOUND (EUS) RADIAL;  Surgeon: Holly Bodily, MD;  Location: [facility] ENDOSCOPY;  Service: Gastroenterology;  Laterality: N/A;  . LEG AMPUTATION Right     Prior to Admission medications   Medication Sig Start Date End Date Taking? Authorizing Provider  albuterol (PROVENTIL HFA;VENTOLIN HFA) 108 (90 Base) MCG/ACT inhaler Inhale into the lungs. 01/25/18 01/25/19 Yes [provider]  dicyclomine (BENTYL) 20 MG tablet Take 20 mg by mouth 3 (three) times daily before meals.   Yes [provider]  DULoxetine (CYMBALTA) 60 MG capsule Take 60 mg by mouth daily.    Yes [provider]  esomeprazole (NEXIUM) 40 MG capsule Take 40 mg by mouth daily.    Yes [provider]  FLUoxetine (PROZAC) 20 MG capsule Take 20 mg by mouth daily.   Yes [provider]  furosemide  (LASIX) 20 MG tablet Take 1 tablet (20 mg total) by mouth 2 (two) times daily. 05/26/18  Yes [name], MD  gabapentin (NEURONTIN) 300 MG capsule Take 300 mg by mouth 3 (three) times daily.    Yes [provider]  glipiZIDE (GLUCOTROL) 5 MG tablet Take 1 tablet (5 mg total) by mouth daily before breakfast. 05/26/18 05/26/19 Yes [name], MD  lipase/protease/amylase (CREON) 12000 units CPEP capsule Take by mouth 3 (three) times daily with meals.   Yes [provider]  lisinopril (PRINIVIL,ZESTRIL) 10 MG tablet Take 1 tablet by mouth daily. 03/09/17  Yes [provider]  oxyCODONE (OXY IR/ROXICODONE) 5 MG immediate release tablet Take 1 tablet (5 mg total) by mouth every 4 (four) hours as needed for moderate pain. 06/11/18  Yes [name], MD  oxyCODONE (OXYCONTIN) 60 MG 12 hr tablet Take 60 mg by mouth every 12 (twelve) hours. 06/11/18  Yes [name], MD  pantoprazole (PROTONIX) 40 MG tablet  05/21/18  Yes [provider]  pravastatin (PRAVACHOL) 40 MG tablet Take 1 tablet (40 mg total) by mouth daily. 07/11/18  Yes Gollan, Kathlene November, MD  tamsulosin (FLOMAX) 0.4 MG CAPS capsule Take 0.4 mg by mouth daily.    Yes [provider]  Antihemophilic Factor, Recomb, 6283 UNITS KIT Inject 1 kit into the vein every other day. Halixate or Kogenate    [provider]  heparin lock flush 100 UNIT/ML SOLN injection Flush port with Heparin(100unit/ml) 38m flush after118mflush  of NS flush with every factor port  infusion and PRN. 06/13/18   [provider]  NARCAN 4 MG/0.1ML LIQD nasal spray kit Place 1 spray into both nostrils every 5 (five) minutes as needed (overdose).  03/14/17   [provider]  sodium chloride flush 0.9 % SOLN injection NS Flush port before and after factor infusion and PRN 06/13/18   [provider]    Allergies as of 07/02/2018 - Review Complete 07/02/2018  Allergen Reaction  Noted  . Aspirin Other (See Comments) 09/10/2014  . Other Other (See Comments) 07/10/2015    Family History  Problem Relation Age of Onset  . Heart Problems Mother   . Heart attack Father   . Heart attack Brother   . Heart attack Cousin     Social History   Socioeconomic History  . Marital status: Married    Spouse name: Not on file  . Number of children: Not on file  . Years of education: Not on file  . Highest education level: Not on file  Occupational History  . Not on file  Social Needs  . Financial resource strain: Not on file  . Food insecurity:    Worry: Not on file    Inability: Not on file  . Transportation needs:    Medical: Not on file    Non-medical: Not on file  Tobacco Use  . Smoking status: Former Smoker    Packs/day: 2.00    Years: 20.00    Pack years: 40.00    Types: Cigarettes    Last attempt to quit: 04/24/2018    Years since quitting: 0.3  . Smokeless tobacco: Current User    Types: Snuff  Substance and Sexual Activity  . Alcohol use: No  . Drug use: Yes    Types: Marijuana    Comment: none  . Sexual activity: Not Currently  Lifestyle  . Physical activity:    Days per week: Not on file    Minutes per session: Not on file  . Stress: Not on file  Relationships  . Social connections:    Talks on phone: Not on file    Gets together: Not on file    Attends religious service: Not on file    Active member of club or organization: Not on file    Attends meetings of clubs or organizations: Not on file    Relationship status: Not on file  . Intimate partner violence:    Fear of current or ex partner: Not on file    Emotionally abused: Not on file    Physically abused: Not on file    Forced sexual activity: Not on file  Other Topics Concern  . Not on file  Social History Narrative  . Not on file    Review of Systems: See HPI, otherwise negative ROS  Physical Exam: BP 109/64   Pulse 82   Ht _0  (1.727 m)   Wt 90.7 kg   SpO2 96%    BMI 30.41 kg/m  General:   Alert,  pleasant and cooperative in NAD Head:  Normocephalic and atraumatic. Neck:  Supple; no masses or thyromegaly. Lungs:  Clear throughout to auscultation.    Heart:  Regular rate and rhythm. Abdomen:  Soft, nontender and nondistended. Normal bowel sounds, without guarding, and without rebound.   Neurologic:  Alert and  oriented x4;  grossly normal neurologically.  Impression/Plan: HAIRO GARRAWAY is here for an ERCP to be performed for stent change   Risks, benefits, limitations, and alternatives regarding  ERCP have been reviewed with the patient.  Questions have been answered.  All parties agreeable.   Shawn Lame, MD  08/14/2018, 11:44 AM

## 2018-08-14 NOTE — Transfer of Care (Signed)
Immediate Anesthesia Transfer of Care Note  Patient: Shawn Cardenas  Procedure(s) Performed: ENDOSCOPIC RETROGRADE CHOLANGIOPANCREATOGRAPHY (ERCP) (N/A )  Patient Location: PACU  Anesthesia Type:General  Level of Consciousness: sedated  Airway & Oxygen Therapy: Patient Spontanous Breathing and Patient connected to face mask oxygen  Post-op Assessment: Report given to RN and Post -op Vital signs reviewed and stable  Post vital signs: Reviewed and stable  Last Vitals:  Vitals Value Taken Time  BP 111/62 08/14/2018 12:45 PM  Temp 36.7 C 08/14/2018 12:45 PM  Pulse 82 08/14/2018 12:46 PM  Resp 10 08/14/2018 12:46 PM  SpO2 97 % 08/14/2018 12:46 PM  Vitals shown include unvalidated device data.  Last Pain:  Vitals:   08/14/18 1245  PainSc: 0-No pain         Complications: No apparent anesthesia complications

## 2018-08-14 NOTE — Anesthesia Post-op Follow-up Note (Signed)
Anesthesia QCDR form completed.        

## 2018-08-14 NOTE — Anesthesia Procedure Notes (Signed)
Procedure Name: Intubation Date/Time: 08/14/2018 11:55 AM Performed by: Hedda Slade, CRNA Pre-anesthesia Checklist: Patient identified, Patient being monitored, Timeout performed, Emergency Drugs available and Suction available Patient Re-evaluated:Patient Re-evaluated prior to induction Oxygen Delivery Method: Circle system utilized Preoxygenation: Pre-oxygenation with 100% oxygen Induction Type: IV induction Ventilation: Mask ventilation without difficulty and Oral airway inserted - appropriate to patient size Laryngoscope Size: McGraph and 4 Grade View: Grade I Tube type: Oral Tube size: 7.0 mm Number of attempts: 1 Airway Equipment and Method: Stylet Placement Confirmation: ETT inserted through vocal cords under direct vision,  positive ETCO2 and breath sounds checked- equal and bilateral Secured at: 22 cm Tube secured with: Tape Dental Injury: Teeth and Oropharynx as per pre-operative assessment  Comments: mcgrath used for covid precautions

## 2018-08-14 NOTE — H&P (Signed)
[name], MD [address] [place], [place] [address] Phone:[phone] Fax : [phone]  Primary Care Physician:  [name], MD Primary Gastroenterologist:  Dr. Allen Norris  Pre-Procedure History & Physical: HPI:  Shawn Cardenas is a 63 y.o. male is here for an ERCP.   Past Medical History:  Diagnosis Date  . Anxiety   . Arthritis   . Blood transfusion without reported diagnosis   . Clotting disorder ([place])   . Depression   . Diabetes mellitus without complication ([place])   . GERD (gastroesophageal reflux disease)   . Hypertension   . Neuromuscular disorder [facility])     Past Surgical History:  Procedure Laterality Date  . CHOLECYSTECTOMY    . ERCP N/A 05/29/2018   Procedure: ENDOSCOPIC RETROGRADE CHOLANGIOPANCREATOGRAPHY (ERCP);  Surgeon: [name], MD;  Location: [facility] ENDOSCOPY;  Service: Endoscopy;  Laterality: N/A;  . ERCP N/A 06/04/2018   Procedure: ENDOSCOPIC RETROGRADE CHOLANGIOPANCREATOGRAPHY (ERCP);  Surgeon: [name], MD;  Location: [facility] ENDOSCOPY;  Service: Endoscopy;  Laterality: N/A;  . EUS N/A 06/14/2018   Procedure: FULL UPPER ENDOSCOPIC ULTRASOUND (EUS) RADIAL;  Surgeon: Holly Bodily, MD;  Location: [facility] ENDOSCOPY;  Service: Gastroenterology;  Laterality: N/A;  . LEG AMPUTATION Right     Prior to Admission medications   Medication Sig Start Date End Date Taking? Authorizing Provider  albuterol (PROVENTIL HFA;VENTOLIN HFA) 108 (90 Base) MCG/ACT inhaler Inhale into the lungs. 01/25/18 01/25/19 Yes [provider]  dicyclomine (BENTYL) 20 MG tablet Take 20 mg by mouth 3 (three) times daily before meals.   Yes [provider]  DULoxetine (CYMBALTA) 60 MG capsule Take 60 mg by mouth daily.    Yes [provider]  esomeprazole (NEXIUM) 40 MG capsule Take 40 mg by mouth daily.    Yes [provider]  FLUoxetine (PROZAC) 20 MG capsule Take 20 mg by mouth daily.   Yes [provider]  furosemide  (LASIX) 20 MG tablet Take 1 tablet (20 mg total) by mouth 2 (two) times daily. 05/26/18  Yes [name], MD  gabapentin (NEURONTIN) 300 MG capsule Take 300 mg by mouth 3 (three) times daily.    Yes [provider]  glipiZIDE (GLUCOTROL) 5 MG tablet Take 1 tablet (5 mg total) by mouth daily before breakfast. 05/26/18 05/26/19 Yes [name], MD  lipase/protease/amylase (CREON) 12000 units CPEP capsule Take by mouth 3 (three) times daily with meals.   Yes [provider]  lisinopril (PRINIVIL,ZESTRIL) 10 MG tablet Take 1 tablet by mouth daily. 03/09/17  Yes [provider]  oxyCODONE (OXY IR/ROXICODONE) 5 MG immediate release tablet Take 1 tablet (5 mg total) by mouth every 4 (four) hours as needed for moderate pain. 06/11/18  Yes [name], MD  oxyCODONE (OXYCONTIN) 60 MG 12 hr tablet Take 60 mg by mouth every 12 (twelve) hours. 06/11/18  Yes [name], MD  pantoprazole (PROTONIX) 40 MG tablet  05/21/18  Yes [provider]  pravastatin (PRAVACHOL) 40 MG tablet Take 1 tablet (40 mg total) by mouth daily. 07/11/18  Yes Gollan, Kathlene November, MD  tamsulosin (FLOMAX) 0.4 MG CAPS capsule Take 0.4 mg by mouth daily.    Yes [provider]  Antihemophilic Factor, Recomb, 6283 UNITS KIT Inject 1 kit into the vein every other day. Halixate or Kogenate    [provider]  heparin lock flush 100 UNIT/ML SOLN injection Flush port with Heparin(100unit/ml) 38m flush after118mflush  of NS flush with every factor port  infusion and PRN. 06/13/18   [provider]  NARCAN 4 MG/0.1ML LIQD nasal spray kit Place 1 spray into both nostrils every 5 (five) minutes as needed (overdose).  03/14/17   [provider]  sodium chloride flush 0.9 % SOLN injection NS Flush port before and after factor infusion and PRN 06/13/18   [provider]    Allergies as of 07/02/2018 - Review Complete 07/02/2018  Allergen Reaction  Noted  . Aspirin Other (See Comments) 09/10/2014  . Other Other (See Comments) 07/10/2015    Family History  Problem Relation Age of Onset  . Heart Problems Mother   . Heart attack Father   . Heart attack Brother   . Heart attack Cousin     Social History   Socioeconomic History  . Marital status: Married    Spouse name: Not on file  . Number of children: Not on file  . Years of education: Not on file  . Highest education level: Not on file  Occupational History  . Not on file  Social Needs  . Financial resource strain: Not on file  . Food insecurity:    Worry: Not on file    Inability: Not on file  . Transportation needs:    Medical: Not on file    Non-medical: Not on file  Tobacco Use  . Smoking status: Former Smoker    Packs/day: 2.00    Years: 20.00    Pack years: 40.00    Types: Cigarettes    Last attempt to quit: 04/24/2018    Years since quitting: 0.3  . Smokeless tobacco: Current User    Types: Snuff  Substance and Sexual Activity  . Alcohol use: No  . Drug use: Yes    Types: Marijuana    Comment: none  . Sexual activity: Not Currently  Lifestyle  . Physical activity:    Days per week: Not on file    Minutes per session: Not on file  . Stress: Not on file  Relationships  . Social connections:    Talks on phone: Not on file    Gets together: Not on file    Attends religious service: Not on file    Active member of club or organization: Not on file    Attends meetings of clubs or organizations: Not on file    Relationship status: Not on file  . Intimate partner violence:    Fear of current or ex partner: Not on file    Emotionally abused: Not on file    Physically abused: Not on file    Forced sexual activity: Not on file  Other Topics Concern  . Not on file  Social History Narrative  . Not on file    Review of Systems: See HPI, otherwise negative ROS  Physical Exam: BP 109/64   Pulse 82   Ht _0  (1.727 m)   Wt 90.7 kg   SpO2 96%    BMI 30.41 kg/m  General:   Alert,  pleasant and cooperative in NAD Head:  Normocephalic and atraumatic. Neck:  Supple; no masses or thyromegaly. Lungs:  Clear throughout to auscultation.    Heart:  Regular rate and rhythm. Abdomen:  Soft, nontender and nondistended. Normal bowel sounds, without guarding, and without rebound.   Neurologic:  Alert and  oriented x4;  grossly normal neurologically.  Impression/Plan: Shawn Cardenas is here for an ERCP to be performed for stent change   Risks, benefits, limitations, and alternatives regarding  ERCP have been reviewed with the patient.  Questions have been answered.  All parties agreeable.   [name], MD  08/14/2018, 11:44 AM

## 2018-08-14 NOTE — Anesthesia Postprocedure Evaluation (Signed)
Anesthesia Post Note  Patient: Shawn Cardenas  Procedure(s) Performed: ENDOSCOPIC RETROGRADE CHOLANGIOPANCREATOGRAPHY (ERCP) (N/A )  Patient location during evaluation: PACU Anesthesia Type: General Level of consciousness: awake and alert Pain management: pain level controlled Vital Signs Assessment: post-procedure vital signs reviewed and stable Respiratory status: spontaneous breathing, nonlabored ventilation, respiratory function stable and patient connected to nasal cannula oxygen Cardiovascular status: blood pressure returned to baseline and stable Postop Assessment: no apparent nausea or vomiting Anesthetic complications: no     Last Vitals:  Vitals:   08/14/18 1309 08/14/18 1320  BP:  (!) 103/55  Pulse:  82  Resp:    Temp:  36.4 C  SpO2: 95% 95%    Last Pain:  Vitals:   08/14/18 1320  TempSrc: Temporal  PainSc: 0-No pain                 [name] S

## 2018-08-15 ENCOUNTER — Encounter: Payer: Self-pay | Admitting: Gastroenterology

## 2018-08-16 LAB — CYTOLOGY - NON PAP

## 2018-08-21 ENCOUNTER — Other Ambulatory Visit: Payer: Self-pay

## 2018-08-21 ENCOUNTER — Ambulatory Visit (INDEPENDENT_AMBULATORY_CARE_PROVIDER_SITE_OTHER): Payer: Medicare Other | Admitting: Gastroenterology

## 2018-08-21 ENCOUNTER — Encounter: Payer: Self-pay | Admitting: Gastroenterology

## 2018-08-21 DIAGNOSIS — C240 Malignant neoplasm of extrahepatic bile duct: Secondary | ICD-10-CM

## 2018-08-21 DIAGNOSIS — C250 Malignant neoplasm of head of pancreas: Secondary | ICD-10-CM

## 2018-08-21 NOTE — Progress Notes (Signed)
[name], MD [address]  [place]  [place], [place] [address]  Main: [phone]  Fax: [phone]    Gastroenterology Virtual/Video Visit  Referring Provider:     [name], MD Primary Care Physician:  [name], MD Primary Gastroenterologist:  Dr.Lavren Lewan [place] Reason for Consultation:     Follow-up after an ERCP        HPI:    Virtual Visit via Video Note Location of the patient: Home Location of provider: Office  Participating persons: The patient myself and Ginger Feldpausch.  I connected with Shawn Cardenas on 08/21/18 at  1:00 PM EDT by a video enabled telemedicine application and verified that I am speaking with the correct person using two identifiers.   I discussed the limitations of evaluation and management by telemedicine and the availability of in person appointments. The patient expressed understanding and agreed to proceed.  Verbal consent to proceed obtained.  History of Present Illness: Shawn Cardenas is a 63 y.o. male referred by Dr. [name], MD  for follow-up after an ERCP.  The patient had an ERCP with stent placement in February of this year due to jaundice.  At that time the patient had an ERCP with a stent placement and brushing of the bile duct.  The brushings of the bile duct came back as:  DIAGNOSIS:  A. COMMON BILE DUCT; BRUSHING:  - SUSPICIOUS FOR MALIGNANCY.  The patient then underwent another ERCP on 4/21 for stent change and re-brushing of the bile duct.  Those brushings came back as:  DIAGNOSIS:  A. COMMON BILE DUCT; BRUSHING:  - POSITIVE FOR MALIGNANCY.  - ADENOCARCINOMA IS PRESENT.  The patient is now being contacted via telemedicine to review the results.  The patient did have post ERCP pancreatitis after the first ERCP that quickly resolved.  The patient had an EUS done back in Feb. That did not hsow a mass and no tissue sampling was done.   Past Medical History:  Diagnosis Date  . Anxiety   .  Arthritis   . Blood transfusion without reported diagnosis   . Clotting disorder ([place])   . Depression   . Diabetes mellitus without complication ([place])   . GERD (gastroesophageal reflux disease)   . Hypertension   . Neuromuscular disorder [facility])     Past Surgical History:  Procedure Laterality Date  . CHOLECYSTECTOMY    . ERCP N/A 05/29/2018   Procedure: ENDOSCOPIC RETROGRADE CHOLANGIOPANCREATOGRAPHY (ERCP);  Surgeon: [name], MD;  Location: [facility] ENDOSCOPY;  Service: Endoscopy;  Laterality: N/A;  . ERCP N/A 06/04/2018   Procedure: ENDOSCOPIC RETROGRADE CHOLANGIOPANCREATOGRAPHY (ERCP);  Surgeon: [name], MD;  Location: [facility] ENDOSCOPY;  Service: Endoscopy;  Laterality: N/A;  . ERCP N/A 08/14/2018   Procedure: ENDOSCOPIC RETROGRADE CHOLANGIOPANCREATOGRAPHY (ERCP);  Surgeon: [name], MD;  Location: [facility] ENDOSCOPY;  Service: Endoscopy;  Laterality: N/A;  . EUS N/A 06/14/2018   Procedure: FULL UPPER ENDOSCOPIC ULTRASOUND (EUS) RADIAL;  Surgeon: Holly Bodily, MD;  Location: [facility] ENDOSCOPY;  Service: Gastroenterology;  Laterality: N/A;  . LEG AMPUTATION Right     Prior to Admission medications   Medication Sig Start Date End Date Taking? Authorizing Provider  albuterol (PROVENTIL HFA;VENTOLIN HFA) 108 (90 Base) MCG/ACT inhaler Inhale into the lungs. 01/25/18 01/25/19  [provider]  Antihemophilic Factor, Recomb, 7902 UNITS KIT Inject 1 kit into the vein every other day. Halixate or Kogenate    [provider]  dicyclomine (BENTYL) 20 MG tablet Take 20  mg by mouth 3 (three) times daily before meals.    [provider]  DULoxetine (CYMBALTA) 60 MG capsule Take 60 mg by mouth daily.     [provider]  esomeprazole (NEXIUM) 40 MG capsule Take 40 mg by mouth daily.     [provider]  FLUoxetine (PROZAC) 20 MG capsule Take 20 mg by mouth daily.    [provider]  furosemide (LASIX) 20 MG tablet Take 1 tablet (20 mg total)  by mouth 2 (two) times daily. 05/26/18   [name], MD  gabapentin (NEURONTIN) 300 MG capsule Take 300 mg by mouth 3 (three) times daily.     [provider]  glipiZIDE (GLUCOTROL) 5 MG tablet Take 1 tablet (5 mg total) by mouth daily before breakfast. 05/26/18 05/26/19  [name], MD  heparin lock flush 100 UNIT/ML SOLN injection Flush port with Heparin(100unit/ml) 76m flush after165mflush  of NS flush with every factor port infusion and PRN. 06/13/18   [provider]  lipase/protease/amylase (CREON) 12000 units CPEP capsule Take by mouth 3 (three) times daily with meals.    [provider]  lisinopril (PRINIVIL,ZESTRIL) 10 MG tablet Take 1 tablet by mouth daily. 03/09/17   [provider]  NARCAN 4 MG/0.1ML LIQD nasal spray kit Place 1 spray into both nostrils every 5 (five) minutes as needed (overdose).  03/14/17   [provider]  oxyCODONE (OXY IR/ROXICODONE) 5 MG immediate release tablet Take 1 tablet (5 mg total) by mouth every 4 (four) hours as needed for moderate pain. 06/11/18   KoEpifanio LeschesMD  oxyCODONE (OXYCONTIN) 60 MG 12 hr tablet Take 60 mg by mouth every 12 (twelve) hours. 06/11/18   KoEpifanio LeschesMD  pantoprazole (PROTONIX) 40 MG tablet  05/21/18   [provider]  pravastatin (PRAVACHOL) 40 MG tablet Take 1 tablet (40 mg total) by mouth daily. 07/11/18   GoMinna MerrittsMD  sodium chloride flush 0.9 % SOLN injection NS Flush port before and after factor infusion and PRN 06/13/18   [provider]  tamsulosin (FLOMAX) 0.4 MG CAPS capsule Take 0.4 mg by mouth daily.     [provider]    Family History  Problem Relation Age of Onset  . Heart Problems Mother   . Heart attack Father   . Heart attack Brother   . Heart attack Cousin      Social History   Tobacco Use  . Smoking status: Former Smoker    Packs/day: 2.00    Years: 20.00    Pack years: 40.00    Types:  Cigarettes    Last attempt to quit: 04/24/2018    Years since quitting: 0.3  . Smokeless tobacco: Current User    Types: Snuff  Substance Use Topics  . Alcohol use: No  . Drug use: Yes    Types: Marijuana    Comment: none    Allergies as of 08/21/2018 - Review Complete 08/14/2018  Allergen Reaction Noted  . Aspirin Other (See Comments) 09/10/2014  . Other Other (See Comments) 07/10/2015    Review of Systems:    All systems reviewed and negative except where noted in HPI.   Observations/Objective:  Labs: CBC    Component Value Date/Time   WBC 6.9 06/09/2018 0349   RBC 4.16 (L) 06/09/2018 0349   HGB 10.7 (L) 06/09/2018 0349   HCT 34.2 (L) 06/09/2018 0349   PLT 171 06/09/2018 0349   MCV 82.2 06/09/2018 0349  MCH 25.7 (L) 06/09/2018 0349   MCHC 31.3 06/09/2018 0349   RDW 21.3 (H) 06/09/2018 0349   LYMPHSABS 1.9 02/25/2016 1416   MONOABS 0.5 02/25/2016 1416   EOSABS 0.2 02/25/2016 1416   BASOSABS 0.1 02/25/2016 1416   CMP     Component Value Date/Time   NA 135 06/09/2018 0349   K 3.9 06/09/2018 0349   CL 102 06/09/2018 0349   CO2 29 06/09/2018 0349   GLUCOSE 112 (H) 06/09/2018 0349   BUN 23 06/09/2018 0349   CREATININE 1.23 06/09/2018 0349   CALCIUM 8.3 (L) 06/09/2018 0349   PROT 6.5 06/09/2018 0349   ALBUMIN 2.8 (L) 06/09/2018 0349   AST 30 06/09/2018 0349   ALT 31 06/09/2018 0349   ALKPHOS 223 (H) 06/09/2018 0349   BILITOT 2.3 (H) 06/09/2018 0349   GFRNONAA >60 06/09/2018 0349   GFRAA >60 06/09/2018 0349    Imaging Studies: Dg C-arm 1-60 Min-no Report  Result Date: 08/14/2018 Fluoroscopy was utilized by the requesting physician.  No radiographic interpretation.    Assessment and Plan:   Shawn Cardenas is a 63 y.o. y/o male following up for his ERCP with brushings of the common bile duct.  As stated above the patient had an EUS that did not show any mass and no sampling was done of the enlarged lymph nodes as they were thought to be reactive.  The  thickened bile duct wall was considered to be due to inflammation from the stents as per the endoscopist to do the ERCP.  A repeat ERCP with brushings showed adenocarcinoma.  The patient has been told that he will need to see oncology.  The patient was given the choice of [facility], [facility] or [facility].  The patient stated that he would like to go to [facility] but the patient's wife insisted that they be seen at [facility].  The patient will have a referral sent to [facility] cancer center for continued follow-up and treatment.  Patient has also been told that he needs a repeat ERCP in 3 months for the stent to be removed.  The patient and his wife have been explained the plan and agree with it.  Follow Up Instructions:  I discussed the assessment and treatment plan with the patient. The patient was provided an opportunity to ask questions and all were answered. The patient agreed with the plan and demonstrated an understanding of the instructions.   The patient was advised to call back or seek an in-person evaluation if the symptoms worsen or if the condition fails to improve as anticipated.  I provided 15 minutes of non-face-to-face time during this encounter.   [name], MD  Speech recognition software was used to dictate the above note.

## 2018-08-26 ENCOUNTER — Other Ambulatory Visit: Payer: Self-pay

## 2018-08-26 NOTE — Progress Notes (Signed)
[facility]  [address] [place], [place] [address] Phone: [phone]  Fax: [phone]   Clinic Day:  08/27/2018  Referring physician: [name], MD  Chief Complaint: Shawn Cardenas is a 63 y.o. 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   HPI:  The patient was recently diagnosed with adenocarcinoma of the common bile duct. 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 He is wheelchair bound.   He describes epigastric pain beginning in 03/2018.  Abdomen and pelvic CT on 05/22/2018 revealed changes of cirrhosis with mild ascites and bilateral pleural effusions.  There were mild lymph nodes within the mesentery and retroperitoneum likely reactive in nature.  There was no sizable adenopathy is noted.  Bilirubin was 1.9 on 05/23/2018.  He was admitted to [facility] from 05/23/2018 - 06/01/2018 with acute pancreatitis.  ERCP on 05/29/2018 revealed a single segmental biliary stricture in the lower third of the main bile duct. A biliary sphincterotomy was performed and one stent was placed into the common bile duct.  Cytology revealed was suspicious for malignancy. Bilirubin was 5.0 on 05/28/2018 and 2.9 on 05/31/2018.  Abdomen MRCP on 05/27/2018 revealed mild intra and extrahepatic biliary duct dilatation, similar to prior CT with abrupt stricturing of the duct in the head of pancreas. There was some soft tissue fullness in the head of pancreas but no discrete or infiltrative mass can be discerned and there was no associated dilatation of the main pancreatic duct. There were no findings for choledocholithiasis.  He was admitted to [facility] from 06/04/2018 - 06/06/2018 and 06/08/2018 - 06/11/2018 with abdominal pain due to biliary obstruction and acute  pancreatitis.  ERCP on 06/04/2018 revealed the known localized biliary stricture in the lower third of the main bile duct, and dilation of the hepatic ducts and intrahepatic branches.  Two stents were placed in the common bile duct. Bilirubin was 8.6 on 06/04/2018.  Upper endoscopic ultrasound on 06/14/2018 by Dr Mont Dutton revealed normal esophagus, stomach, and duodenum, the 2 previously placed stents, and pancreatic parenchymal abnormalities consisting of hyperechoic strands and a heterogeneous appearance in the pancreatic head. No obvious mass was seen, but the presence of shadowing artifact from the bile duct stents made EUS visualization of a small mass lesions difficult. A benign appearing cystic lesion was seen in the pancreatic body. No biopsies were taken. Several lymph nodes were visualized in the porta hepatis and peripancreatic regions suggestive of reactive lymph nodes either from recent pancreatitis or underlying cirrhosis. There was a diffusely nodular appearance to the left lobe of the liver edge consistent with known cirrhosis.  CA19-9 on 92 on 07/02/2018.  ERCP on 08/14/2018 by Dr Allen Norris revealed prior biliary sphincterotomy (open), two stents from the biliary tree in the major papilla (two stents removed) and one plastic stent placed into the common bile duct.  Cytology from the common bile duct revealed adenocarcinoma.   Symptomatically, the patient reports feeling "lousy".  He continues to have epigastric pain.  He has lost 30+ pounds in the last year.  Appetite is decreased.  He eats 1/2 portions.  He has diarrhea/loose stools.  He denies any melena or hematochezia.  He has severe factor VIII deficiency.  He notes a history of joints bleeds when he was younger.  He is followed by Dr. Tacey Ruiz  at [facility].  He is on prophylaxis with Kogenate 3000 units 3 times a week (M,W,F).   He has a history of hepatitis C and cirrhosis.  He was treated with interferon and ribavirin in 1990.  Hepatitis  C quantitative negative on 05/23/2018.  AFP was 1.2 on 07/02/2018.  He is followed by Dr. Felton Clinton at the [facility].   Past Medical History:  Diagnosis Date  . Anxiety   . Arthritis   . Blood transfusion without reported diagnosis   . Clotting disorder ([place])   . Depression   . Diabetes mellitus without complication ([place])   . GERD (gastroesophageal reflux disease)   . Hypertension   . Neuromuscular disorder [facility])     Past Surgical History:  Procedure Laterality Date  . CHOLECYSTECTOMY    . ERCP N/A 05/29/2018   Procedure: ENDOSCOPIC RETROGRADE CHOLANGIOPANCREATOGRAPHY (ERCP);  Surgeon: [name], MD;  Location: [facility] ENDOSCOPY;  Service: Endoscopy;  Laterality: N/A;  . ERCP N/A 06/04/2018   Procedure: ENDOSCOPIC RETROGRADE CHOLANGIOPANCREATOGRAPHY (ERCP);  Surgeon: [name], MD;  Location: [facility] ENDOSCOPY;  Service: Endoscopy;  Laterality: N/A;  . ERCP N/A 08/14/2018   Procedure: ENDOSCOPIC RETROGRADE CHOLANGIOPANCREATOGRAPHY (ERCP);  Surgeon: [name], MD;  Location: [facility] ENDOSCOPY;  Service: Endoscopy;  Laterality: N/A;  . EUS N/A 06/14/2018   Procedure: FULL UPPER ENDOSCOPIC ULTRASOUND (EUS) RADIAL;  Surgeon: Holly Bodily, MD;  Location: [facility] ENDOSCOPY;  Service: Gastroenterology;  Laterality: N/A;  . LEG AMPUTATION Right     Family History  Problem Relation Age of Onset  . Heart Problems Mother   . Heart attack Father   . Heart attack Brother   . Heart attack Cousin     Social History:  reports that he quit smoking about 4 months ago. His smoking use included cigarettes. He has a 40.00 pack-year smoking history. His smokeless tobacco use includes snuff. He reports current drug use. Drug: Marijuana. He reports that he does not drink alcohol. He previously lived in [place].  He has been in [place] x 5 years.  He retired in 1987.  He was a Dietitian.  He is on disability.  The patient is alone today.  His wife [name] is on the phone.  Allergies:   Allergies  Allergen Reactions  . Aspirin Other (See Comments)    bleeding bleeding ASA contraindicated with bleeding disorder  . Other Other (See Comments)    NSAIDS contraindicated with bleeding disorder.    Current Medications: Current Outpatient Medications  Medication Sig Dispense Refill  . Antihemophilic Factor, Recomb, 7353 UNITS KIT Inject 1 kit into the vein every other day. Halixate or Kogenate    . dicyclomine (BENTYL) 20 MG tablet Take 20 mg by mouth 3 (three) times daily before meals.    . DULoxetine (CYMBALTA) 60 MG capsule Take 60 mg by mouth daily.     Marland Kitchen esomeprazole (NEXIUM) 40 MG capsule Take 40 mg by mouth daily.     Marland Kitchen FLUoxetine (PROZAC) 20 MG capsule Take 20 mg by mouth daily.    . furosemide (LASIX) 20 MG tablet Take 1 tablet (20 mg total) by mouth 2 (two) times daily. (Patient taking differently: Take 20 mg by mouth as needed. ) 60 tablet 0  . gabapentin (NEURONTIN) 300 MG capsule Take 300 mg by mouth 3 (three) times daily.     Marland Kitchen glipiZIDE (GLUCOTROL) 5 MG tablet Take 1 tablet (5 mg total) by mouth daily before breakfast. 30 tablet 0  . heparin lock flush  100 UNIT/ML SOLN injection Flush port with Heparin(100unit/ml) 25m flush after176mflush  of NS flush with every factor port infusion and PRN.    . Marland Kitchenisinopril (PRINIVIL,ZESTRIL) 10 MG tablet Take 1 tablet by mouth daily.    . Marland KitchenxyCODONE (OXYCONTIN) 20 mg 12 hr tablet Take 20 mg by mouth every 12 (twelve) hours.     . Marland KitchenxyCODONE (OXYCONTIN) 20 mg 12 hr tablet Take 20 mg by mouth every 12 (twelve) hours.     . pravastatin (PRAVACHOL) 40 MG tablet Take 1 tablet (40 mg total) by mouth daily. 90 tablet 0  . sodium chloride flush 0.9 % SOLN injection NS Flush port before and after factor infusion and PRN    . tamsulosin (FLOMAX) 0.4 MG CAPS capsule Take 0.4 mg by mouth daily.     . Marland Kitchenlbuterol (PROVENTIL HFA;VENTOLIN HFA) 108 (90 Base) MCG/ACT inhaler Inhale into the lungs.    . lipase/protease/amylase (CREON) 12000 units  CPEP capsule Take by mouth 3 (three) times daily with meals.    . Marland KitchenARCAN 4 MG/0.1ML LIQD nasal spray kit Place 1 spray into both nostrils every 5 (five) minutes as needed (overdose).     . pantoprazole (PROTONIX) 40 MG tablet      No current facility-administered medications for this visit.     Review of Systems  Constitutional: Positive for weight loss (30+ pounds in 1 year). Negative for chills, diaphoresis, fever and malaise/fatigue.       Feels "lousy".  HENT: Negative.  Negative for congestion, ear discharge, ear pain, hearing loss, nosebleeds, sinus pain and sore throat.   Eyes: Negative.  Negative for blurred vision, double vision, photophobia and pain.  Respiratory: Positive for shortness of breath (since 03/2018). Negative for cough, sputum production and wheezing.   Cardiovascular: Negative.  Negative for chest pain, palpitations, orthopnea, leg swelling and PND.  Gastrointestinal: Positive for abdominal pain (epigastric) and diarrhea (stool loose; stool pale yellow/white). Negative for blood in stool, constipation, melena, nausea (rare) and vomiting.       Eating small portions.  Genitourinary: Negative for dysuria, frequency, hematuria and urgency.       Hold water at times.  Musculoskeletal: Positive for joint pain (in all joints). Negative for back pain, myalgias and neck pain.  Skin: Negative for itching and rash.       Pruritus.  Neurological: Negative for dizziness, sensory change, weakness and headaches.  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  Psychiatric/Behavioral: Negative for depression and memory loss. The patient is not nervous/anxious and does not have insomnia.    Performance status (ECOG): 2  Physical Exam  Constitutional: He is oriented to person, place, and time.  Chronically fatigued gentleman sitting in an electric wheelchair in no acute distress.  HENT:  Head: Normocephalic and atraumatic.  Mouth/Throat:  Oropharynx is clear and moist. No oropharyngeal exudate.  Wearing a [place] cap.  Gray beard.  Wearing a mask.  Eyes: Pupils are equal, round, and reactive to light. Conjunctivae and EOM are normal.  Blue eyes.  Neck: Normal range of motion. Neck supple. No JVD present.  Cardiovascular: Normal rate, regular rhythm and normal heart sounds. Exam reveals no gallop and no friction rub.  No murmur heard. Pulmonary/Chest: Effort normal and breath sounds normal. No respiratory distress. He has no wheezes. He has no rales. He exhibits no tenderness.  Decreased breath sounds at the right base.  Abdominal: Soft. Bowel sounds are normal. He exhibits no distension and no mass. There is no  abdominal tenderness (epigastric area). There is no rebound and no guarding.  Musculoskeletal: Normal range of motion.        General: No edema.     Comments: s/p right AKA; left knee fixed and extended secondary to steel rod.  Left 2nd and 3rd finger amputated.  Lymphadenopathy:    He has no cervical adenopathy.    He has no axillary adenopathy.       Right: No supraclavicular adenopathy present.       Left: No supraclavicular adenopathy present.  Neurological: He is alert and oriented to person, place, and time.  Skin: Skin is warm and dry. No rash noted. He is not diaphoretic. No erythema. No pallor.  Excoriations on arms.  Tattoos.  Psychiatric: He has a normal mood and affect. His behavior is normal. Judgment and thought content normal.  Nursing note and vitals reviewed.   No visits with results within 3 Day(s) from this visit.  Latest known visit with results is:  Admission on 08/14/2018, Discharged on 08/14/2018  Component Date Value Ref Range Status  . CYTOLOGY - NON GYN 08/14/2018    Final                   Value:Cytology - Non PAP CASE: [id number] PATIENT: Shawn Cardenas Non-Gyn Cytology Report   SPECIMEN SUBMITTED: A. Common bile duct; brushing  CLINICAL HISTORY: None provided   PRE-OPERATIVE DIAGNOSIS: None provided  POST-OPERATIVE DIAGNOSIS: None provided.   DIAGNOSIS: A. COMMON BILE DUCT; BRUSHING: - POSITIVE FOR MALIGNANCY. - ADENOCARCINOMA IS PRESENT.  Comment: One ThinPrep smear displays and adequately cellular specimen comprised of numerous clusters of benign ductal epithelial cells. In addition, there is an abundance of clusters of cells displaying moderate atypia, 3 dimensional architecture, with increased N: C ratio and nuclear contour irregularities.  The findings could are positive for malignancy and compatible with adenocarcinoma.  The cell block specimen displays mostly necroinflammatory debris with few scattered atypical cells, and is insufficient for ancillary testing.  Slides reviewed: 1 ThinPrep, 1 cell block  GROSS DESCRIPTION:                          A. Labeled: Labeled with patient's name and date of birth (per requisition, common bile duct cytology brushing) Received: Fresh Volume: 10 mL Description: Tan, slightly cloudy fluid with 2 collection brushes. Received in a plastic specimen container with a white screw top lid. Submitted for ThinPrep and cell block.  Final Diagnosis performed by Allena Napoleon, MD.   Electronically signed 08/16/2018 10:07:18AM The electronic signature indicates that the named Attending Pathologist has evaluated the specimen  Technical component performed at [facility], [address] [place], [place] [address] Lab: [phone] Dir: Rush Farmer, MD, MMM  Professional component performed at [facility], [facility], [place], [employer], [place] [address] Lab: [phone] Dir: Dellia Nims. Rubinas, MD   . Glucose-Capillary 08/14/2018 114* 70 - 99 mg/dL Final  . Glucose-Capillary 08/14/2018 60* 70 - 99 mg/dL Final    Assessment:  Shawn Cardenas is a 63 y.o. male with probable extrahepatic cholangiocarcinoma s/p ERCP on 08/14/2018.  Cytology from the common bile duct revealed  adenocarcinoma.  He presented with abdominal pain, jaundice, and weight loss.  CA19-9 on 92 on 07/02/2018.  CEA was 3.6 on 05/29/2018.  Abdomen and pelvic CT on 05/22/2018 revealed changes of cirrhosis with mild ascites and bilateral pleural effusions.  There were mild lymph nodes within the mesentery and retroperitoneum  likely reactive in nature.  There was no sizable adenopathy.   ERCP on 05/29/2018 revealed a single segmental biliary stricture in the lower third of the main bile duct. A biliary sphincterotomy was performed and one stent was placed into the common bile duct.  Cytology revealed was suspicious for malignancy.  ERCP on 06/04/2018 revealed the known localized biliary stricture in the lower third of the main bile duct, and dilation of the hepatic ducts and intrahepatic branches.  Two stents were placed in the common bile duct.  ERCP on 08/14/2018 revealed prior biliary sphincterotomy (open), two stents from the biliary tree in the major papilla (two stents removed) and one plastic stent placed into the common bile duct.  Cytology from the common bile duct revealed adenocarcinoma.   Abdomen MRCP on 05/27/2018 revealed mild intra and extrahepatic biliary duct dilatation, similar to prior CT with abrupt stricturing of the duct in the head of pancreas. There was some soft tissue fullness in the head of pancreas but no discrete or infiltrative mass can be discerned and there was no associated dilatation of the main pancreatic duct. There were no findings for choledocholithiasis.  Upper endoscopic ultrasound on 06/14/2018 by revealed normal esophagus, stomach, and duodenum, the 2 previously placed stents, and pancreatic parenchymal abnormalities consisting of hyperechoic strands and a heterogeneous appearance in the pancreatic head.  No obvious mass was seen, but the presence of shadowing artifact from the bile duct stents made EUS visualization of a small mass lesions difficult.  A benign appearing  cystic lesion was seen in the pancreatic body. Several lymph nodes were visualized in the porta hepatis and peripancreatic regions suggestive of reactive lymph nodes either from recent pancreatitis or underlying cirrhosis. There was a diffusely nodular appearance to the left lobe of the liver edge c/w known cirrhosis.  He has severe factor VIII deficiency.  He is followed by Dr. Tacey Ruiz at [facility].  He is on prophylaxis with Kogenate 3000 units 3 times a week (M,W,F).   He has a history of hepatitis C and cirrhosis.  He was treated with interferon and ribavirin in 1990.  Hepatitis C quantitative negative on 05/23/2018.  AFP was 1.2 on 07/02/2018.  Symptomatically, he feels "lousy".  He has lost 30+ pounds in the past year.  He has ongoing epigastric pain.  He has loose stools/diarrhea.  Plan: 1.   Labs today:  CBC with diff, CMP, CA19-9, CEA. 2.   Extrahepatic cholangiocarcinoma, presumed.  Location of tumor difficult to ascertain.  Cytology from [facility].  Discuss staging and management of distal cholangiocarcinoma.  Discuss plan for multi-disciplinary evaluation and assessment by surgery in a tertiary care hospital.  Chest CT to complete staging.  Discuss unclear significance of small nodes noted on endoscopic ultrasound (? reactive).  Mariea Clonts to assist with coordination of care with [facility] or [facility]. 3.   Macrocytic anemia  Etiology may be secondary to underlying liver disease.  Labs:  B12, folate, TSH. 4.   Chronic diarrhea  Etiology unknown.  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  Patient notes previously on Creon, but no improvement.  Stool for C diff - on 07/23/2018. 5.   Chronic pain  Patient sees Dr. Winferd Humphrey at [facility].  Discuss coordination of care.  Discuss referral to palliative care Altha Harm, NP). 6.   Hypokalemia  Etiology likely secondary to diarrhea.  Patient has Rx for potassium.  Begin potassium 20 meq a day. 7.   RTC in 1 week for MD assessment, review of interval labs  and chest CT.  Addendum:  Spoke with Dr Allen Norris after labs back today.  AST 103, ALT 8, alkaline phosphatase 472, and bilirubin 2.0.  Prior LFTs on 06/09/2018:  AST 30, ALT 31, bilirubin 2.3, alkaline phosphatase 223.  Plan recheck LFTs in 2 days.  I discussed the assessment and treatment plan with the patient.  The patient was provided an opportunity to ask questions and all were answered.  The patient agreed with the plan and demonstrated an understanding of the instructions.  The patient was advised to call back if the symptoms worsen or if the condition fails to improve as anticipated.  I provided 40 minutes of face-to-face time during this this encounter and > 50% was spent counseling as documented under my assessment and plan.    Nolon Stalls, MD, PhD    08/27/2018, 11:04 AM

## 2018-08-27 ENCOUNTER — Encounter: Payer: Self-pay | Admitting: Hematology and Oncology

## 2018-08-27 ENCOUNTER — Telehealth: Payer: Self-pay

## 2018-08-27 ENCOUNTER — Inpatient Hospital Stay: Payer: Medicare Other

## 2018-08-27 ENCOUNTER — Inpatient Hospital Stay: Payer: Medicare Other | Attending: Hematology and Oncology | Admitting: Hematology and Oncology

## 2018-08-27 VITALS — BP 123/69 | HR 85 | Temp 97.8°F | Resp 18

## 2018-08-27 DIAGNOSIS — Z79899 Other long term (current) drug therapy: Secondary | ICD-10-CM | POA: Insufficient documentation

## 2018-08-27 DIAGNOSIS — E1122 Type 2 diabetes mellitus with diabetic chronic kidney disease: Secondary | ICD-10-CM | POA: Diagnosis not present

## 2018-08-27 DIAGNOSIS — C250 Malignant neoplasm of head of pancreas: Secondary | ICD-10-CM

## 2018-08-27 DIAGNOSIS — K219 Gastro-esophageal reflux disease without esophagitis: Secondary | ICD-10-CM | POA: Insufficient documentation

## 2018-08-27 DIAGNOSIS — J90 Pleural effusion, not elsewhere classified: Secondary | ICD-10-CM | POA: Insufficient documentation

## 2018-08-27 DIAGNOSIS — C221 Intrahepatic bile duct carcinoma: Secondary | ICD-10-CM

## 2018-08-27 DIAGNOSIS — Z7984 Long term (current) use of oral hypoglycemic drugs: Secondary | ICD-10-CM | POA: Diagnosis not present

## 2018-08-27 DIAGNOSIS — Z87891 Personal history of nicotine dependence: Secondary | ICD-10-CM | POA: Insufficient documentation

## 2018-08-27 DIAGNOSIS — D649 Anemia, unspecified: Secondary | ICD-10-CM

## 2018-08-27 DIAGNOSIS — N183 Chronic kidney disease, stage 3 (moderate): Secondary | ICD-10-CM | POA: Diagnosis not present

## 2018-08-27 DIAGNOSIS — K529 Noninfective gastroenteritis and colitis, unspecified: Secondary | ICD-10-CM | POA: Insufficient documentation

## 2018-08-27 DIAGNOSIS — R197 Diarrhea, unspecified: Secondary | ICD-10-CM | POA: Diagnosis not present

## 2018-08-27 DIAGNOSIS — G8929 Other chronic pain: Secondary | ICD-10-CM | POA: Insufficient documentation

## 2018-08-27 DIAGNOSIS — D66 Hereditary factor VIII deficiency: Secondary | ICD-10-CM | POA: Insufficient documentation

## 2018-08-27 DIAGNOSIS — K766 Portal hypertension: Secondary | ICD-10-CM | POA: Diagnosis not present

## 2018-08-27 DIAGNOSIS — Z8619 Personal history of other infectious and parasitic diseases: Secondary | ICD-10-CM | POA: Diagnosis not present

## 2018-08-27 DIAGNOSIS — E876 Hypokalemia: Secondary | ICD-10-CM | POA: Diagnosis not present

## 2018-08-27 DIAGNOSIS — R188 Other ascites: Secondary | ICD-10-CM | POA: Diagnosis not present

## 2018-08-27 DIAGNOSIS — E46 Unspecified protein-calorie malnutrition: Secondary | ICD-10-CM

## 2018-08-27 DIAGNOSIS — K7460 Unspecified cirrhosis of liver: Secondary | ICD-10-CM | POA: Insufficient documentation

## 2018-08-27 DIAGNOSIS — Z993 Dependence on wheelchair: Secondary | ICD-10-CM | POA: Insufficient documentation

## 2018-08-27 DIAGNOSIS — K861 Other chronic pancreatitis: Secondary | ICD-10-CM

## 2018-08-27 DIAGNOSIS — B1920 Unspecified viral hepatitis C without hepatic coma: Secondary | ICD-10-CM | POA: Diagnosis not present

## 2018-08-27 DIAGNOSIS — R634 Abnormal weight loss: Secondary | ICD-10-CM

## 2018-08-27 DIAGNOSIS — I129 Hypertensive chronic kidney disease with stage 1 through stage 4 chronic kidney disease, or unspecified chronic kidney disease: Secondary | ICD-10-CM | POA: Diagnosis not present

## 2018-08-27 LAB — CBC WITH DIFFERENTIAL/PLATELET
Abs Immature Granulocytes: 0.03 10*3/uL (ref 0.00–0.07)
Basophils Absolute: 0 10*3/uL (ref 0.0–0.1)
Basophils Relative: 0 %
Eosinophils Absolute: 0.2 10*3/uL (ref 0.0–0.5)
Eosinophils Relative: 3 %
HCT: 30.1 % — ABNORMAL LOW (ref 39.0–52.0)
Hemoglobin: 9.6 g/dL — ABNORMAL LOW (ref 13.0–17.0)
Immature Granulocytes: 0 %
Lymphocytes Relative: 12 %
Lymphs Abs: 0.9 10*3/uL (ref 0.7–4.0)
MCH: 29.6 pg (ref 26.0–34.0)
MCHC: 31.9 g/dL (ref 30.0–36.0)
MCV: 92.9 fL (ref 80.0–100.0)
Monocytes Absolute: 0.5 10*3/uL (ref 0.1–1.0)
Monocytes Relative: 7 %
Neutro Abs: 5.6 10*3/uL (ref 1.7–7.7)
Neutrophils Relative %: 78 %
Platelets: 234 10*3/uL (ref 150–400)
RBC: 3.24 MIL/uL — ABNORMAL LOW (ref 4.22–5.81)
RDW: 17.2 % — ABNORMAL HIGH (ref 11.5–15.5)
WBC: 7.3 10*3/uL (ref 4.0–10.5)
nRBC: 0 % (ref 0.0–0.2)

## 2018-08-27 LAB — COMPREHENSIVE METABOLIC PANEL
ALT: 85 U/L — ABNORMAL HIGH (ref 0–44)
AST: 103 U/L — ABNORMAL HIGH (ref 15–41)
Albumin: 2.6 g/dL — ABNORMAL LOW (ref 3.5–5.0)
Alkaline Phosphatase: 472 U/L — ABNORMAL HIGH (ref 38–126)
Anion gap: 7 (ref 5–15)
BUN: 12 mg/dL (ref 8–23)
CO2: 23 mmol/L (ref 22–32)
Calcium: 8.4 mg/dL — ABNORMAL LOW (ref 8.9–10.3)
Chloride: 104 mmol/L (ref 98–111)
Creatinine, Ser: 0.96 mg/dL (ref 0.61–1.24)
GFR calc Af Amer: >60 mL/min (ref >60)
GFR calc non Af Amer: >60 mL/min (ref >60)
Glucose, Bld: 211 mg/dL — ABNORMAL HIGH (ref 70–99)
Potassium: 3.2 mmol/L — ABNORMAL LOW (ref 3.5–5.1)
Sodium: 134 mmol/L — ABNORMAL LOW (ref 135–145)
Total Bilirubin: 2 mg/dL — ABNORMAL HIGH (ref 0.3–1.2)
Total Protein: 6.4 g/dL — ABNORMAL LOW (ref 6.5–8.1)

## 2018-08-27 LAB — IRON AND TIBC
Iron: 44 ug/dL — ABNORMAL LOW (ref 45–182)
Saturation Ratios: 14 % — ABNORMAL LOW (ref 17.9–39.5)
TIBC: 320 ug/dL (ref 250–450)
UIBC: 276 ug/dL

## 2018-08-27 LAB — VITAMIN B12: Vitamin B-12: 407 pg/mL (ref 180–914)

## 2018-08-27 LAB — RETICULOCYTES
Immature Retic Fract: 25.7 % — ABNORMAL HIGH (ref 2.3–15.9)
RBC.: 3.29 MIL/uL — ABNORMAL LOW (ref 4.22–5.81)
Retic Count, Absolute: 131.3 10*3/uL (ref 19.0–186.0)
Retic Ct Pct: 4 % — ABNORMAL HIGH (ref 0.4–3.1)

## 2018-08-27 LAB — FERRITIN: Ferritin: 149 ng/mL (ref 24–336)

## 2018-08-27 LAB — FOLATE: Folate: 18.4 ng/mL (ref >5.9)

## 2018-08-27 NOTE — Progress Notes (Signed)
Pt here as new patient referral from Dr. Allen Norris. Denies any concerns.

## 2018-08-27 NOTE — Telephone Encounter (Signed)
-----   Message from Lequita Asal, MD sent at 08/27/2018  1:28 PM EDT ----- Regarding: Potassium is low  Is he ok to take oral potassium for a few days?  M

## 2018-08-27 NOTE — Patient Instructions (Signed)
  NIH/NCI cancer site  [email or web address]

## 2018-08-27 NOTE — Telephone Encounter (Signed)
Spoke with patient and wife regarding low potassium levels. Advised that Dr. Mike Gip would like patient to start taking Potassium. Wife reports patient has Potassium available as a PRN basis but was unsure of dose. Wife agrees to send a message this afternoon with dosing and how many he has available so that I can make Dr. Mike Gip aware and we can determine what he needs to take.

## 2018-08-28 ENCOUNTER — Other Ambulatory Visit: Payer: Self-pay

## 2018-08-28 ENCOUNTER — Ambulatory Visit
Admission: RE | Admit: 2018-08-28 | Discharge: 2018-08-28 | Disposition: A | Discharge status: Home / Self Care | Payer: Medicare Other | Source: Ambulatory Visit | Attending: Hematology and Oncology | Admitting: Hematology and Oncology

## 2018-08-28 ENCOUNTER — Telehealth: Payer: Self-pay

## 2018-08-28 DIAGNOSIS — C221 Intrahepatic bile duct carcinoma: Secondary | ICD-10-CM | POA: Insufficient documentation

## 2018-08-28 LAB — CEA: CEA: 2.8 ng/mL (ref 0.0–4.7)

## 2018-08-28 LAB — CANCER ANTIGEN 19-9: CA 19-9: 210 U/mL — ABNORMAL HIGH (ref 0–35)

## 2018-08-28 NOTE — Telephone Encounter (Signed)
Called and spoke with spouse, [name]. They would like [facility] for surgical referral. I have entered the referral in [place] and spoken to [place] to coordinate. She will arrange and contact Mr. Adkison.

## 2018-08-29 ENCOUNTER — Encounter: Payer: Self-pay | Admitting: Hematology and Oncology

## 2018-08-29 ENCOUNTER — Other Ambulatory Visit: Payer: Self-pay

## 2018-08-29 ENCOUNTER — Inpatient Hospital Stay (HOSPITAL_BASED_OUTPATIENT_CLINIC_OR_DEPARTMENT_OTHER): Payer: Medicare Other | Admitting: Hospice and Palliative Medicine

## 2018-08-29 ENCOUNTER — Inpatient Hospital Stay: Payer: Medicare Other

## 2018-08-29 ENCOUNTER — Inpatient Hospital Stay (HOSPITAL_BASED_OUTPATIENT_CLINIC_OR_DEPARTMENT_OTHER): Payer: Medicare Other | Admitting: Hematology and Oncology

## 2018-08-29 VITALS — BP 135/80 | HR 92 | Temp 97.6°F | Resp 16

## 2018-08-29 DIAGNOSIS — Z4659 Encounter for fitting and adjustment of other gastrointestinal appliance and device: Secondary | ICD-10-CM

## 2018-08-29 DIAGNOSIS — D649 Anemia, unspecified: Secondary | ICD-10-CM

## 2018-08-29 DIAGNOSIS — R945 Abnormal results of liver function studies: Secondary | ICD-10-CM

## 2018-08-29 DIAGNOSIS — D681 Hereditary factor XI deficiency: Secondary | ICD-10-CM

## 2018-08-29 DIAGNOSIS — C221 Intrahepatic bile duct carcinoma: Secondary | ICD-10-CM | POA: Diagnosis not present

## 2018-08-29 DIAGNOSIS — I129 Hypertensive chronic kidney disease with stage 1 through stage 4 chronic kidney disease, or unspecified chronic kidney disease: Secondary | ICD-10-CM

## 2018-08-29 DIAGNOSIS — E1122 Type 2 diabetes mellitus with diabetic chronic kidney disease: Secondary | ICD-10-CM

## 2018-08-29 DIAGNOSIS — D66 Hereditary factor VIII deficiency: Secondary | ICD-10-CM

## 2018-08-29 DIAGNOSIS — Z515 Encounter for palliative care: Secondary | ICD-10-CM

## 2018-08-29 DIAGNOSIS — C250 Malignant neoplasm of head of pancreas: Secondary | ICD-10-CM

## 2018-08-29 DIAGNOSIS — J90 Pleural effusion, not elsewhere classified: Secondary | ICD-10-CM | POA: Diagnosis not present

## 2018-08-29 DIAGNOSIS — R7989 Other specified abnormal findings of blood chemistry: Secondary | ICD-10-CM

## 2018-08-29 DIAGNOSIS — R197 Diarrhea, unspecified: Secondary | ICD-10-CM

## 2018-08-29 DIAGNOSIS — E46 Unspecified protein-calorie malnutrition: Secondary | ICD-10-CM

## 2018-08-29 DIAGNOSIS — Z7189 Other specified counseling: Secondary | ICD-10-CM

## 2018-08-29 DIAGNOSIS — R634 Abnormal weight loss: Secondary | ICD-10-CM

## 2018-08-29 DIAGNOSIS — G893 Neoplasm related pain (acute) (chronic): Secondary | ICD-10-CM | POA: Diagnosis not present

## 2018-08-29 DIAGNOSIS — E876 Hypokalemia: Secondary | ICD-10-CM

## 2018-08-29 DIAGNOSIS — B1920 Unspecified viral hepatitis C without hepatic coma: Secondary | ICD-10-CM

## 2018-08-29 DIAGNOSIS — K861 Other chronic pancreatitis: Secondary | ICD-10-CM

## 2018-08-29 DIAGNOSIS — Z87891 Personal history of nicotine dependence: Secondary | ICD-10-CM

## 2018-08-29 DIAGNOSIS — N183 Chronic kidney disease, stage 3 (moderate): Secondary | ICD-10-CM

## 2018-08-29 LAB — COMPREHENSIVE METABOLIC PANEL
ALT: 108 U/L — ABNORMAL HIGH (ref 0–44)
AST: 136 U/L — ABNORMAL HIGH (ref 15–41)
Albumin: 2.8 g/dL — ABNORMAL LOW (ref 3.5–5.0)
Alkaline Phosphatase: 569 U/L — ABNORMAL HIGH (ref 38–126)
Anion gap: 5 (ref 5–15)
BUN: 12 mg/dL (ref 8–23)
CO2: 23 mmol/L (ref 22–32)
Calcium: 8.2 mg/dL — ABNORMAL LOW (ref 8.9–10.3)
Chloride: 105 mmol/L (ref 98–111)
Creatinine, Ser: 0.92 mg/dL (ref 0.61–1.24)
GFR calc Af Amer: >60 mL/min (ref >60)
GFR calc non Af Amer: >60 mL/min (ref >60)
Glucose, Bld: 181 mg/dL — ABNORMAL HIGH (ref 70–99)
Potassium: 3.6 mmol/L (ref 3.5–5.1)
Sodium: 133 mmol/L — ABNORMAL LOW (ref 135–145)
Total Bilirubin: 2.9 mg/dL — ABNORMAL HIGH (ref 0.3–1.2)
Total Protein: 6.7 g/dL (ref 6.5–8.1)

## 2018-08-29 LAB — LIPASE, BLOOD: Lipase: 47 U/L (ref 11–51)

## 2018-08-29 NOTE — Progress Notes (Signed)
[facility]  [address] [place], [place] [address] Phone: [phone]  Fax: [phone]   Clinic Day:  08/29/2018     Referring physician: [name], MD  Chief Complaint: Shawn Cardenas is a 63 y.o. 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   HPI: The patient was last seen in the medical oncology clinic on 08/27/2018. At that time, he felt "lousy".  He has lost 30+ pounds in the past year.  He had ongoing epigastric pain.  He had loose stools/diarrhea.  CBC showed hematocrit 30.1, hemoglobin 9.6, and MCV 92.9.  AST was 103, ALT 8, alkaline phosphatase 472, and bilirubin 2.0. CEA was 2.8. CA 19-9 was 210.   Chest CT without contrast on 08/28/2018 revealed pathologically enlarged mediastinal lymph nodes, new since CT chest on 03/27/2007.  Nodal metastatic disease could not be excluded. There was a moderate/large right-sided pleural effusion and small left-sided pleural effusion. There was mild cardiac enlargement with diffuse bilateral interlobular septal thickening and scattered ground-glass airspace opacities most concerning for volume overload with developing pulmonary edema. There were a few scattered pulmonary nodules bilaterally. There was cirrhosis with sequela of portal hypertension.   During the interim, he says "I'm doing alright." His shortness of breath has slightly improved. He cannot lay flat in bed. His epigastric pain persists.   He is hesitant to begin several procedure and is considering avoiding any future interventions once he has gotten information from all of his providers.    Past Medical History:  Diagnosis Date  . Anxiety   . Arthritis   . Blood transfusion without reported diagnosis   . Clotting disorder ([place])   . Depression   . Diabetes mellitus without complication ([place])   . GERD (gastroesophageal reflux  disease)   . Hypertension   . Neuromuscular disorder [facility])     Past Surgical History:  Procedure Laterality Date  . CHOLECYSTECTOMY    . ERCP N/A 05/29/2018   Procedure: ENDOSCOPIC RETROGRADE CHOLANGIOPANCREATOGRAPHY (ERCP);  Surgeon: [name], MD;  Location: [facility] ENDOSCOPY;  Service: Endoscopy;  Laterality: N/A;  . ERCP N/A 06/04/2018   Procedure: ENDOSCOPIC RETROGRADE CHOLANGIOPANCREATOGRAPHY (ERCP);  Surgeon: [name], MD;  Location: [facility] ENDOSCOPY;  Service: Endoscopy;  Laterality: N/A;  . ERCP N/A 08/14/2018   Procedure: ENDOSCOPIC RETROGRADE CHOLANGIOPANCREATOGRAPHY (ERCP);  Surgeon: [name], MD;  Location: [facility] ENDOSCOPY;  Service: Endoscopy;  Laterality: N/A;  . EUS N/A 06/14/2018   Procedure: FULL UPPER ENDOSCOPIC ULTRASOUND (EUS) RADIAL;  Surgeon: Holly Bodily, MD;  Location: [facility] ENDOSCOPY;  Service: Gastroenterology;  Laterality: N/A;  . LEG AMPUTATION Right     Family History  Problem Relation Age of Onset  . Heart Problems Mother   . Heart attack Father   . Heart attack Brother   . Heart attack Cousin     Social History:  reports that he quit smoking about 4 months ago. His smoking use included cigarettes. He has a 40.00 pack-year smoking history. His smokeless tobacco use includes snuff. He reports current drug use. Drug: Marijuana. He reports that he does not drink alcohol. He previously lived in [place].  He has been in [place] x 5 years.  He retired in 1987.  He was a Dietitian.  He is on disability.  The patient is alone today.  His wife [name] is on the phone.  Allergies:  Allergies  Allergen Reactions  .  Aspirin Other (See Comments)    bleeding bleeding ASA contraindicated with bleeding disorder  . Other Other (See Comments)    NSAIDS contraindicated with bleeding disorder.    Current Medications: Current Outpatient Medications  Medication Sig Dispense Refill  . albuterol (PROVENTIL HFA;VENTOLIN HFA) 108 (90 Base) MCG/ACT inhaler  Inhale into the lungs.    . Antihemophilic Factor, Recomb, 6237 UNITS KIT Inject 1 kit into the vein every other day. Halixate or Kogenate    . dicyclomine (BENTYL) 20 MG tablet Take 20 mg by mouth 3 (three) times daily before meals.    . DULoxetine (CYMBALTA) 60 MG capsule Take 60 mg by mouth daily.     Marland Kitchen esomeprazole (NEXIUM) 40 MG capsule Take 40 mg by mouth daily.     Marland Kitchen FLUoxetine (PROZAC) 20 MG capsule Take 20 mg by mouth daily.    . furosemide (LASIX) 20 MG tablet Take 1 tablet (20 mg total) by mouth 2 (two) times daily. (Patient taking differently: Take 20 mg by mouth as needed. ) 60 tablet 0  . gabapentin (NEURONTIN) 300 MG capsule Take 300 mg by mouth 3 (three) times daily.     Marland Kitchen glipiZIDE (GLUCOTROL) 5 MG tablet Take 1 tablet (5 mg total) by mouth daily before breakfast. 30 tablet 0  . heparin lock flush 100 UNIT/ML SOLN injection Flush port with Heparin(100unit/ml) 25m flush after156mflush  of NS flush with every factor port infusion and PRN.    . lipase/protease/amylase (CREON) 12000 units CPEP capsule Take by mouth 3 (three) times daily with meals.    . Marland Kitchenisinopril (PRINIVIL,ZESTRIL) 10 MG tablet Take 1 tablet by mouth daily.    . Marland KitchenxyCODONE (OXYCONTIN) 20 mg 12 hr tablet Take 20 mg by mouth every 12 (twelve) hours.     . Marland KitchenxyCODONE (OXYCONTIN) 20 mg 12 hr tablet Take 20 mg by mouth every 12 (twelve) hours.     . pantoprazole (PROTONIX) 40 MG tablet     . pravastatin (PRAVACHOL) 40 MG tablet Take 1 tablet (40 mg total) by mouth daily. 90 tablet 0  . sodium chloride flush 0.9 % SOLN injection NS Flush port before and after factor infusion and PRN    . tamsulosin (FLOMAX) 0.4 MG CAPS capsule Take 0.4 mg by mouth daily.     . Marland KitchenARCAN 4 MG/0.1ML LIQD nasal spray kit Place 1 spray into both nostrils every 5 (five) minutes as needed (overdose).      No current facility-administered medications for this visit.     Review of Systems  Constitutional: Positive for weight loss (30+ pounds in 1  year; no new weight). Negative for chills, diaphoresis, fever and malaise/fatigue.       Feels "the same; lousy".  HENT: Negative.  Negative for congestion, ear discharge, ear pain, hearing loss, nosebleeds, sinus pain and sore throat.   Eyes: Negative for blurred vision, double vision, photophobia and pain.  Respiratory: Positive for shortness of breath ("not too bad"- slightly improved). Negative for cough, hemoptysis, sputum production and wheezing.   Cardiovascular: Negative for chest pain, palpitations, orthopnea, leg swelling and PND.       Can't lay flat.  Gastrointestinal: Positive for abdominal pain (epigastric) and diarrhea (stool loose; stool pale yellow/white). Negative for blood in stool, constipation, melena, nausea (rare) and vomiting.       Eating small portions.  Genitourinary: Negative for dysuria, frequency, hematuria and urgency.       Hold water at times.  Musculoskeletal: Positive for joint pain (in  all joints). Negative for back pain, myalgias and neck pain.  Skin: Negative for itching and rash.       Pruritus.  Neurological: Negative for dizziness, sensory change, weakness and headaches.  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  Psychiatric/Behavioral: Negative for depression and memory loss. The patient is not nervous/anxious and does not have insomnia.    Performance status (ECOG):  2  Physical Exam  Constitutional: He is oriented to person, place, and time.  Chronically fatigued gentleman sitting in an electric wheelchair in no acute distress.  HENT:  Head: Normocephalic and atraumatic.  Wearing a [place] cap.  Gray beard.  Wearing a mask.  Eyes: Conjunctivae and EOM are normal. Scleral icterus (faint) is present.  Blue eyes.  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  Left 2nd and 3rd finger amputated.  Neurological: He is alert and oriented to person, place,  and time.  Skin: Skin is warm and dry. No rash noted. He is not diaphoretic. No erythema. No pallor.  Excoriations on arms.  Tattoos.  Psychiatric: He has a normal mood and affect. His behavior is normal. Judgment and thought content normal.  Nursing note and vitals reviewed.   Appointment on 08/29/2018  Component Date Value Ref Range Status  . Lipase 08/29/2018 47  11 - 51 U/L Final   Performed at [facility], [address] [place], [place] [address]  . Sodium 08/29/2018 133* 135 - 145 mmol/L Final  . Potassium 08/29/2018 3.6  3.5 - 5.1 mmol/L Final  . Chloride 08/29/2018 105  98 - 111 mmol/L Final  . CO2 08/29/2018 23  22 - 32 mmol/L Final  . Glucose, Bld 08/29/2018 181* 70 - 99 mg/dL Final  . BUN 08/29/2018 12  8 - 23 mg/dL Final  . Creatinine, Ser 08/29/2018 0.92  0.61 - 1.24 mg/dL Final  . Calcium 08/29/2018 8.2* 8.9 - 10.3 mg/dL Final  . Total Protein 08/29/2018 6.7  6.5 - 8.1 g/dL Final  . Albumin 08/29/2018 2.8* 3.5 - 5.0 g/dL Final  . AST 08/29/2018 136* 15 - 41 U/L Final  . ALT 08/29/2018 108* 0 - 44 U/L Final  . Alkaline Phosphatase 08/29/2018 569* 38 - 126 U/L Final  . Total Bilirubin 08/29/2018 2.9* 0.3 - 1.2 mg/dL Final  . GFR calc non Af Amer 08/29/2018 >60  >60 mL/min Final  . GFR calc Af Amer 08/29/2018 >60  >60 mL/min Final  . Anion gap 08/29/2018 5  5 - 15 Final   Performed at [facility], [address], [place], [place] [address]  Appointment on 08/27/2018  Component Date Value Ref Range Status  . Retic Ct Pct 08/27/2018 4.0* 0.4 - 3.1 % Final  . RBC. 08/27/2018 3.29* 4.22 - 5.81 MIL/uL Final  . Retic Count, Absolute 08/27/2018 131.3  19.0 - 186.0 K/uL Final  . Immature Retic Fract 08/27/2018 25.7* 2.3 - 15.9 % Final   Performed at [facility], [address] [place], [place] [address]  . Folate 08/27/2018 18.4  >5.9 ng/mL Final   Performed at [facility], [address] [place], [place] [address]  .  Vitamin B-12 08/27/2018 407  180 - 914 pg/mL Final   Comment: (NOTE) This assay is not validated for testing neonatal or myeloproliferative syndrome specimens for Vitamin B12 levels. Performed at [facility], [place] [address], [place], [place] [address]   .  Iron 08/27/2018 44* 45 - 182 ug/dL Final  . TIBC 08/27/2018 320  250 - 450 ug/dL Final  . Saturation Ratios 08/27/2018 14* 17.9 - 39.5 % Final  . UIBC 08/27/2018 276  ug/dL Final   Performed at [facility], [address], [place], [place] [address]  . Ferritin 08/27/2018 149  24 - 336 ng/mL Final   Performed at [facility], [address] [place], [place] [address]  . CEA 08/27/2018 2.8  0.0 - 4.7 ng/mL Final   Comment: (NOTE)                             Nonsmokers          <3.9                             Smokers             <5.6 Roche [employer] Electrochemiluminescence Immunoassay (ECLIA) Values obtained with different assay methods or kits cannot be used interchangeably.  Results cannot be interpreted as absolute evidence of the presence or absence of malignant disease. Performed At: [facility] [place], [place] [id number] Rush Farmer MD SN:[id number]   . CA 19-9 08/27/2018 210* 0 - 35 U/mL Final   Comment: (NOTE) Roche [employer]ostics Electrochemiluminescence Immunoassay (ECLIA) Values obtained with different assay methods or kits cannot be used interchangeably.  Results cannot be interpreted as absolute evidence of the presence or absence of malignant disease. Performed At: [facility] [place], [place] [id number] Rush Farmer MD BD:[id number]   . Sodium 08/27/2018 134* 135 - 145 mmol/L Final  . Potassium 08/27/2018 3.2* 3.5 - 5.1 mmol/L Final  . Chloride 08/27/2018 104  98 - 111 mmol/L Final  . CO2 08/27/2018 23  22 - 32 mmol/L Final  . Glucose, Bld 08/27/2018 211* 70 - 99 mg/dL Final  . BUN 08/27/2018 12  8 - 23 mg/dL Final  .  Creatinine, Ser 08/27/2018 0.96  0.61 - 1.24 mg/dL Final  . Calcium 08/27/2018 8.4* 8.9 - 10.3 mg/dL Final  . Total Protein 08/27/2018 6.4* 6.5 - 8.1 g/dL Final  . Albumin 08/27/2018 2.6* 3.5 - 5.0 g/dL Final  . AST 08/27/2018 103* 15 - 41 U/L Final  . ALT 08/27/2018 85* 0 - 44 U/L Final  . Alkaline Phosphatase 08/27/2018 472* 38 - 126 U/L Final  . Total Bilirubin 08/27/2018 2.0* 0.3 - 1.2 mg/dL Final  . GFR calc non Af Amer 08/27/2018 >60  >60 mL/min Final  . GFR calc Af Amer 08/27/2018 >60  >60 mL/min Final  . Anion gap 08/27/2018 7  5 - 15 Final   Performed at [facility], [address], [place], [place] [address]  . WBC 08/27/2018 7.3  4.0 - 10.5 K/uL Final  . RBC 08/27/2018 3.24* 4.22 - 5.81 MIL/uL Final  . Hemoglobin 08/27/2018 9.6* 13.0 - 17.0 g/dL Final  . HCT 08/27/2018 30.1* 39.0 - 52.0 % Final  . MCV 08/27/2018 92.9  80.0 - 100.0 fL Final  . MCH 08/27/2018 29.6  26.0 - 34.0 pg Final  . MCHC 08/27/2018 31.9  30.0 - 36.0 g/dL Final  . RDW 08/27/2018 17.2* 11.5 - 15.5 % Final  . Platelets 08/27/2018 234  150 - 400 K/uL Final  . nRBC 08/27/2018 0.0  0.0 - 0.2 % Final  . Neutrophils Relative % 08/27/2018 78  %  Final  . Neutro Abs 08/27/2018 5.6  1.7 - 7.7 K/uL Final  . Lymphocytes Relative 08/27/2018 12  % Final  . Lymphs Abs 08/27/2018 0.9  0.7 - 4.0 K/uL Final  . Monocytes Relative 08/27/2018 7  % Final  . Monocytes Absolute 08/27/2018 0.5  0.1 - 1.0 K/uL Final  . Eosinophils Relative 08/27/2018 3  % Final  . Eosinophils Absolute 08/27/2018 0.2  0.0 - 0.5 K/uL Final  . Basophils Relative 08/27/2018 0  % Final  . Basophils Absolute 08/27/2018 0.0  0.0 - 0.1 K/uL Final  . Immature Granulocytes 08/27/2018 0  % Final  . Abs Immature Granulocytes 08/27/2018 0.03  0.00 - 0.07 K/uL Final   Performed at [facility], [address], [place], [place] [address]    Assessment:  BECKHEM ISADORE is a 63 y.o. male with probable extrahepatic  cholangiocarcinoma s/p ERCP on 08/14/2018.  Cytology from the common bile duct revealed adenocarcinoma.  He presented with abdominal pain, jaundice, and weight loss.  CA19-9 was 92 on 07/02/2018 and 210 on 08/27/2018.  CEA was 3.6 on 05/29/2018 and 2.8 on 08/27/2018.  Abdomen and pelvic CT on 05/22/2018 revealed changes of cirrhosis with mild ascites and bilateral pleural effusions.  There were mild lymph nodes within the mesentery and retroperitoneum likely reactive in nature.  There was no sizable adenopathy.   ERCP on 05/29/2018 revealed a single segmental biliary stricture in the lower third of the main bile duct. A biliary sphincterotomy was performed and one stent was placed into the common bile duct.  Cytology revealed was suspicious for malignancy.  ERCP on 06/04/2018 revealed the known localized biliary stricture in the lower third of the main bile duct, and dilation of the hepatic ducts and intrahepatic branches.  Two stents were placed in the common bile duct.  ERCP on 08/14/2018 revealed prior biliary sphincterotomy (open), two stents from the biliary tree in the major papilla (two stents removed) and one plastic stent placed into the common bile duct.  Cytology from the common bile duct revealed adenocarcinoma.   Abdomen MRCP on 05/27/2018 revealed mild intra and extrahepatic biliary duct dilatation, similar to prior CT with abrupt stricturing of the duct in the head of pancreas. There was some soft tissue fullness in the head of pancreas but no discrete or infiltrative mass can be discerned and there was no associated dilatation of the main pancreatic duct. There were no findings for choledocholithiasis.  Upper endoscopic ultrasound on 06/14/2018 by revealed normal esophagus, stomach, and duodenum, the 2 previously placed stents, and pancreatic parenchymal abnormalities consisting of hyperechoic strands and a heterogeneous appearance in the pancreatic head.  No obvious mass was seen, but  the presence of shadowing artifact from the bile duct stents made EUS visualization of a small mass lesions difficult.  A benign appearing cystic lesion was seen in the pancreatic body. Several lymph nodes were visualized in the porta hepatis and peripancreatic regions suggestive of reactive lymph nodes either from recent pancreatitis or underlying cirrhosis. There was a diffusely nodular appearance to the left lobe of the liver edge c/w known cirrhosis.  He has severe factor VIII deficiency.  He is followed by Dr. Tacey Ruiz at [facility].  He is on prophylaxis with Kogenate 3000 units 3 times a week (M,W,F).   He has a history of hepatitis C and cirrhosis.  He was treated with interferon and ribavirin in 1990.  Hepatitis C quantitative negative on 05/23/2018.  AFP was 1.2 on 07/02/2018.  Chest CT without contrast on 08/28/2018 revealed pathologically enlarged mediastinal lymph nodes, new since CT chest on 03/27/2007. Nodal metastatic disease could not be excluded. There was a moderate/large right-sided pleural effusion and small left-sided pleural effusion. There was mild cardiac enlargement with diffuse bilateral interlobular septal thickening and scattered ground-glass airspace opacities most concerning for volume overload with developing pulmonary edema. There were a few scattered pulmonary nodules bilaterally. There was cirrhosis with sequela of portal hypertension.   Echo on 05/23/2018 revealed an EF of 50-55% with LVH with moderately dilated left atrium and mild-moderate mitral regurgitation.  Symptomatically, he feels "the same".  Exam reveals slight icterus.  Bilirubin is 2.0.  Alkaline phosphatase is 569.  AST is 136 and ALT is 108.  Lipase is normal.  Plan: 1.   Extrahepatic cholangiocarcinoma, presumed.             Location of tumor difficult to ascertain.  Cytology from [facility].             Review chest CT.  Images personally reviewed with patient.  Agree with radiology interpretation.   Discuss  new pathologically enlarged mediastinal nodes and moderate right sided effusion.   Etiology unclear but could represent metastatic disease.   Discuss consideration of diagnostic and therapeutic thoracentesis.   If non-diagnostic, patient may require bronchoscopy.  Review unclear significance of small nodes in the porta hepatitis and peripancreatic area (? reactive).             Review plan for MRI at [facility] on 09/07/2018 and appointment with Dr Cloyd Stagers at [facility] on 09/10/2018.  Coordinate care with Dr Cloyd Stagers.  Contact directly. 2.   Pleural effusion  Moderate right sided pleural effusion on chest CT.  Patient denies any worsening shortness  Etiology unclear.  Possibly related to malignancy.  Discuss diagnostic and therapeutic thoracentesis.  Review planned procedure with Dr Tacey Ruiz, patient's hemophilia physician. 3.   Increased liver function tests  Review labs since 06/09/2018.  Increasing alkaline phosphatase, liver enzymes, and bilirubin.  Discussed with Dr Allen Norris.   Stent may have migrated, become clogged or overgrown.  Discuss plan for ERCP and stent replacement on 08/31/2018.  Anticipate Ginger from Dr Dorothey Baseman office to contact patient.             4.   Chronic diarrhea             Etiology unknown.             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  Patient notes previously on Creon, but no improvement.             Stool for C diff - on 07/23/2018.  Discuss follow-up with GI. 5.   Chronic pain             Patient sees Dr. Winferd Humphrey at [facility].             Patient saw Sandre Kitty, NP from palliative care today.             Patient will continue current medications.  Billey Chang, NP has left a message at the [facility] for Dr. Winferd Humphrey. 6.   Hypokalemia             Potassium 3.6 after supplementation.  Hold further potassium supplementation.  7.   Factor VIII deficiency  Discussed potential upcoming procedures with Dr. Floreen Comber Key today.  Paracentesis:   50 u/kg Kogenate before paracentesis    25 u/kg the evening after the procedure  50 u/kg a day for 2 days after procedure  Bronchoscopy (if needed)   At [facility].  May require hospitalization.  Major surgery   Continuous factor support in hospital. 8.   Anticipate thoracentesis on 09/03/2018 or 09/04/2018 (will schedule after discuss with [facility] surgeon) 9.   RTC in 1 week for MD assessment and labs (CBC with diff, CMP).  I discussed the assessment and treatment plan with the patient.  The patient was provided an opportunity to ask questions and all were answered.  The patient agreed with the plan and demonstrated an understanding of the instructions.  The patient was advised to call back if the symptoms worsen or if the condition fails to improve as anticipated.  I provided 30 minutes of face-to-face time during this this encounter and > 50% was spent counseling as documented under my assessment and plan.    Lequita Asal, MD, PhD    08/29/2018, 4:08 PM  I, Molly Dorshimer, am acting as Education administrator for [employer]. Mike Gip, MD, PhD.  I, Melissa C. Mike Gip, MD, have reviewed the above documentation for accuracy and completeness, and I agree with the above.

## 2018-08-29 NOTE — Progress Notes (Signed)
[place]  Telephone:([phone] [phone]   Name: Shawn Cardenas Date: 08/29/2018 MRN: [id number]  DOB: 1955-06-08  Patient Care Team: [name], MD as PCP - General (Family Medicine) Clent Jacks, RN as Registered Nurse [name], MD as Consulting Physician (Gastroenterology) Meriel Pica, Jonelle Sidle, MD as Referring Physician (Anesthesiology)    REASON FOR CONSULTATION: Palliative Care consult requested for this 63 y.o. male with multiple medical problems including extrahepatic cholangiocarcinoma.  PMH also notable for history of septic knee following a right AKA, hypertension, diabetes, stage III CKD, factor VIII deficiency, hemophilia A, GERD, and HCV with cirrhosis.  Patient was hospitalized 05/23/2018-06/01/2018 with acute pancreatitis.  ERCP was performed and biliary stent was placed.  Cytology was suspicious for malignancy.  Patient was rehospitalized 06/04/2018-06/06/2018 and 06/08/2018-06/11/2018 with recurrent pancreatitis with ERCP and restenting performed.  Cytology from [facility] with ERCP on 08/14/2018 revealed adenocarcinoma.  Patient has chronic pain and was followed by Dr. Meriel Pica at [facility]. Patient was referred to palliative care to provide supportive care with symptom management and goals.   SOCIAL HISTORY:     reports that he quit smoking about 4 months ago. His smoking use included cigarettes. He has a 40.00 pack-year smoking history. His smokeless tobacco use includes snuff. He reports current drug use. Drug: Marijuana. He reports that he does not drink alcohol.   Patient is originally from [place] and moved to [place] in 2016.  Patient is married and lives at home with his wife.  He has a son and daughter, one of whom lives in [place] and the other in [place].  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  He also worked as an Editor, commissioning and as an Contractor.  ADVANCE  DIRECTIVES:  Does not have.  Information given on 08/29/2018  CODE STATUS:   PAST MEDICAL HISTORY: Past Medical History:  Diagnosis Date  . Anxiety   . Arthritis   . Blood transfusion without reported diagnosis   . Clotting disorder ([place])   . Depression   . Diabetes mellitus without complication ([place])   . GERD (gastroesophageal reflux disease)   . Hypertension   . Neuromuscular disorder ([place])     PAST SURGICAL HISTORY:  Past Surgical History:  Procedure Laterality Date  . CHOLECYSTECTOMY    . ERCP N/A 05/29/2018   Procedure: ENDOSCOPIC RETROGRADE CHOLANGIOPANCREATOGRAPHY (ERCP);  Surgeon: [name], MD;  Location: [facility] ENDOSCOPY;  Service: Endoscopy;  Laterality: N/A;  . ERCP N/A 06/04/2018   Procedure: ENDOSCOPIC RETROGRADE CHOLANGIOPANCREATOGRAPHY (ERCP);  Surgeon: [name], MD;  Location: [facility] ENDOSCOPY;  Service: Endoscopy;  Laterality: N/A;  . ERCP N/A 08/14/2018   Procedure: ENDOSCOPIC RETROGRADE CHOLANGIOPANCREATOGRAPHY (ERCP);  Surgeon: [name], MD;  Location: [facility] ENDOSCOPY;  Service: Endoscopy;  Laterality: N/A;  . EUS N/A 06/14/2018   Procedure: FULL UPPER ENDOSCOPIC ULTRASOUND (EUS) RADIAL;  Surgeon: Holly Bodily, MD;  Location: [facility] ENDOSCOPY;  Service: Gastroenterology;  Laterality: N/A;  . LEG AMPUTATION Right     HEMATOLOGY/ONCOLOGY HISTORY:   No history exists.    ALLERGIES:  is allergic to aspirin and other.  MEDICATIONS:  Current Outpatient Medications  Medication Sig Dispense Refill  . albuterol (PROVENTIL HFA;VENTOLIN HFA) 108 (90 Base) MCG/ACT inhaler Inhale into the lungs.    . Antihemophilic Factor, Recomb, 2725 UNITS KIT Inject 1 kit into the vein every other day. Halixate or Kogenate    . dicyclomine (BENTYL) 20  MG tablet Take 20 mg by mouth 3 (three) times daily before meals.    . DULoxetine (CYMBALTA) 60 MG capsule Take 60 mg by mouth daily.     Marland Kitchen esomeprazole (NEXIUM) 40 MG capsule Take 40 mg by mouth daily.     Marland Kitchen FLUoxetine  (PROZAC) 20 MG capsule Take 20 mg by mouth daily.    . furosemide (LASIX) 20 MG tablet Take 1 tablet (20 mg total) by mouth 2 (two) times daily. (Patient taking differently: Take 20 mg by mouth as needed. ) 60 tablet 0  . gabapentin (NEURONTIN) 300 MG capsule Take 300 mg by mouth 3 (three) times daily.     Marland Kitchen glipiZIDE (GLUCOTROL) 5 MG tablet Take 1 tablet (5 mg total) by mouth daily before breakfast. 30 tablet 0  . heparin lock flush 100 UNIT/ML SOLN injection Flush port with Heparin(100unit/ml) 50m flush after148mflush  of NS flush with every factor port infusion and PRN.    . lipase/protease/amylase (CREON) 12000 units CPEP capsule Take by mouth 3 (three) times daily with meals.    . Marland Kitchenisinopril (PRINIVIL,ZESTRIL) 10 MG tablet Take 1 tablet by mouth daily.    . Marland KitchenARCAN 4 MG/0.1ML LIQD nasal spray kit Place 1 spray into both nostrils every 5 (five) minutes as needed (overdose).     . Marland KitchenxyCODONE (OXYCONTIN) 20 mg 12 hr tablet Take 20 mg by mouth every 12 (twelve) hours.     . Marland KitchenxyCODONE (OXYCONTIN) 20 mg 12 hr tablet Take 20 mg by mouth every 12 (twelve) hours.     . pantoprazole (PROTONIX) 40 MG tablet     . pravastatin (PRAVACHOL) 40 MG tablet Take 1 tablet (40 mg total) by mouth daily. 90 tablet 0  . sodium chloride flush 0.9 % SOLN injection NS Flush port before and after factor infusion and PRN    . tamsulosin (FLOMAX) 0.4 MG CAPS capsule Take 0.4 mg by mouth daily.      No current facility-administered medications for this visit.     VITAL SIGNS: There were no vitals taken for this visit. There were no vitals filed for this visit.  Estimated body mass index is 30.41 kg/m as calculated from the following:   Height as of 08/14/18: '5\' 8"'$  (1.727 m).   Weight as of 08/14/18: 200 lb (90.7 kg).  LABS: CBC:    Component Value Date/Time   WBC 7.3 08/27/2018 1213   HGB 9.6 (L) 08/27/2018 1213   HCT 30.1 (L) 08/27/2018 1213   PLT 234 08/27/2018 1213   MCV 92.9 08/27/2018 1213   NEUTROABS 5.6  08/27/2018 1213   LYMPHSABS 0.9 08/27/2018 1213   MONOABS 0.5 08/27/2018 1213   EOSABS 0.2 08/27/2018 1213   BASOSABS 0.0 08/27/2018 1213   Comprehensive Metabolic Panel:    Component Value Date/Time   NA 134 (L) 08/27/2018 1213   K 3.2 (L) 08/27/2018 1213   CL 104 08/27/2018 1213   CO2 23 08/27/2018 1213   BUN 12 08/27/2018 1213   CREATININE 0.96 08/27/2018 1213   GLUCOSE 211 (H) 08/27/2018 1213   CALCIUM 8.4 (L) 08/27/2018 1213   AST 103 (H) 08/27/2018 1213   ALT 85 (H) 08/27/2018 1213   ALKPHOS 472 (H) 08/27/2018 1213   BILITOT 2.0 (H) 08/27/2018 1213   PROT 6.4 (L) 08/27/2018 1213   ALBUMIN 2.6 (L) 08/27/2018 1213    RADIOGRAPHIC STUDIES: Ct Chest Wo Contrast  Result Date: 08/28/2018 CLINICAL DATA:  Pancreatic cancer.  Concern for metastatic disease. EXAM: CT  CHEST WITHOUT CONTRAST TECHNIQUE: Multidetector CT imaging of the chest was performed following the standard protocol without IV contrast. COMPARISON:  CT chest dated 03/26/2017 FINDINGS: Cardiovascular: The heart is mild-to-moderately enlarged. Coronary artery calcifications are noted. Mediastinum/Nodes: There are multiple enlarged mediastinal lymph nodes. For example there is a pathologically enlarged 2.2 cm right paratracheal lymph node. Multiple enlarged hilar lymph nodes are noted. There is a 2.2 cm subcarinal lymph node. There are prominent but subcentimeter supraclavicular lymph nodes, greatest on the left. No significant axillary adenopathy. There is mild nodularity of the thyroid gland, similar to prior study. Lungs/Pleura: There is a moderate to large right-sided pleural effusion. There is a small left-sided pleural effusion. There is diffuse bilateral interlobular septal thickening. There are few scattered ground-glass airspace opacities common most notably in the left upper lobe. There is no definite large solitary pulmonary nodule. There is a 6 mm nodule in the superior segment of the left lower lobe. A background of  mild emphysematous changes is noted. Upper Abdomen: The liver appears cirrhotic. Findings are suspicious for worsening intrahepatic and extrahepatic biliary ductal dilatation. A partially visualized biliary stent is noted. The spleen is enlarged. Musculoskeletal: No chest wall mass or suspicious bone lesions identified. A left-sided Port-A-Cath is noted. The tip is well positioned. There are old healed left-sided rib fractures. IMPRESSION: 1. Evaluation is limited by the lack of IV contrast. 2. Pathologically enlarged mediastinal lymph nodes. These are new since CT chest dated 03/27/2007. Nodal metastatic disease cannot be excluded. 3. Moderate/large right-sided pleural effusion. Small left-sided pleural effusion. 4. Mild cardiac enlargement with diffuse bilateral interlobular septal thickening and scattered ground-glass airspace opacities is most concerning for volume overload with developing pulmonary edema. There are a few scattered pulmonary nodules bilaterally. Attention on follow-up examinations is recommended. 5. Cirrhosis with sequela of portal hypertension. Findings are suspicious for worsening intrahepatic and extrahepatic biliary ductal dilatation. Correlation with laboratory studies is recommended. Electronically Signed   By: Constance Holster M.D.   On: 08/28/2018 17:02   Dg C-arm 1-60 Min-no Report  Result Date: 08/14/2018 Fluoroscopy was utilized by the requesting physician.  No radiographic interpretation.    PERFORMANCE STATUS (ECOG) : 1 - Symptomatic but completely ambulatory  Review of Systems Unless otherwise noted, a complete review of systems is negative.  Physical Exam General: NAD, frail appearing, in wheelchair Cardiovascular: regular rate and rhythm Pulmonary: clear ant fields Abdomen: Rotund, some tenderness with palpation mid/upper gastric area, bowel sounds present GU: no suprapubic tenderness Extremities: no edema, right AKA Skin: no rashes Neurological: Weakness  but otherwise nonfocal  IMPRESSION: I met with patient today in the clinic.  I introduced palliative care services and attempted to establish therapeutic rapport.  Patient's wife participated via conference call.  Patient has chronic pain and has been managed on opioids for many years.  Patient reports that he was previously taking OxyContin 300 mg daily in [place] before he established care with the pain clinic at [facility] and began weaning himself down on medications.  Patient is currently prescribed OxyContin 20 mg every 12 hours and oxycodone IR 15 mg twice daily PRN for breakthrough pain.  However, patient says that he takes both doses of OxyContin and oxycodone at bedtime to help him sleep.  He denies taking any OxyContin or oxycodone IR during the daytime and says he relies on distraction during the day.  Patient is also on gabapentin 300 mg nightly for phantom limb pain, Cymbalta 60 mg for depression, and uses Voltaren gel as  needed to the right AKA stump.  He also uses as needed Celebrex.  Patient reports that pain is poorly controlled and is impairing his ability to engage in activities he once found enjoyable (such as spending time with friends or playing poker).  Patient has several hospitalizations recently for pain and has been given IV hydromorphone with good effect.  Patient/wife asked about adjustment to his opioid regimen to better control pain.  It would seem reasonable to consider every 12 or even to every 8 hour dosing of his LAO and increasing frequency of his PRN for BTP before rotating to a new analgesic.  CPB might also be a consideration given patient's description of localized pain in the gallbladder area. However, access to interventional pain procedures are still limited given COVID-19. As patient has been established at [facility] pain clinic for several years, I feel it prudent and courteous to speak with Dr. London Sheer.  PDMP reviewed. PDMP shows OxyContin last prescribed on 11/2017.  However, he has seen Dr. Meriel Pica as recently as 07/02/18 with refills of oxycodone and OxyContin through 08/2018. It appears that patient has had medications mailed to him from a pharmacy in [place] associated with his wife's employer.  Likely the out-of-state pharmacy is the explanation for medications not showing on PDMP database.  Patient has been referred to [facility] for consideration of surgery.  He says he does not fully know what to expect in regards to his cancer.  He seems interested in trying surgery if it is felt that he would have a good outcome.  He seems less sure about other treatment modalities such as chemotherapy.  Patient says that he has been researching on the Internet and expects that he will not survive more than 5 years.  Patient says he would not be interested in treatment if it were likely to affect his quality of life.  He verbalized a desire for quality of life over quantity of life.  We discussed advance care planning.  Patient was giving ACP documents for him to review and complete.  Patient says he would want his wife to be his decision maker if needed.  I also reviewed with him a MOST Form and can complete at time of a future visit.   PLAN: -Continue current scope of treatment -Would recommend increasing OxyContin to '30mg'$  Q12H and oxycodone IR '15mg'$  Q4H PRN.  -Message left at [facility] for Dr. London Sheer -ACP/MOST form were reviewed -RTC in 2 weeks   Patient expressed understanding and was in agreement with this plan. He also understands that He can call the clinic at any time with any questions, concerns, or complaints.     Time Total: 30 minutes  Visit consisted of counseling and education dealing with the complex and emotionally intense issues of symptom management and palliative care in the setting of serious and potentially life-threatening illness.Greater than 50%  of this time was spent counseling and coordinating care related to the above assessment and plan.   Signed by: Altha Harm, PhD, NP-C [phone] (Work Cell)

## 2018-08-29 NOTE — Progress Notes (Signed)
Pt here for follow up. Denies any concerns.  

## 2018-08-31 ENCOUNTER — Ambulatory Visit: Payer: Medicare Other

## 2018-08-31 ENCOUNTER — Encounter: Admission: RE | Payer: Self-pay | Source: Home / Self Care

## 2018-08-31 ENCOUNTER — Other Ambulatory Visit: Payer: Self-pay

## 2018-08-31 ENCOUNTER — Ambulatory Visit: Payer: Medicare Other | Admitting: Anesthesiology

## 2018-08-31 ENCOUNTER — Encounter
Admission: RE | Disposition: A | Discharge status: Home / Self Care | Payer: Self-pay | Source: Ambulatory Visit | Attending: Gastroenterology

## 2018-08-31 ENCOUNTER — Encounter: Payer: Self-pay | Admitting: *Deleted

## 2018-08-31 ENCOUNTER — Inpatient Hospital Stay: Admission: RE | Admit: 2018-08-31 | Payer: Medicare Other | Source: Ambulatory Visit

## 2018-08-31 ENCOUNTER — Ambulatory Visit
Admission: RE | Admit: 2018-08-31 | Discharge: 2018-08-31 | Disposition: A | Discharge status: Home / Self Care | Payer: Medicare Other | Source: Ambulatory Visit | Attending: Gastroenterology | Admitting: Gastroenterology

## 2018-08-31 ENCOUNTER — Ambulatory Visit: Admission: RE | Admit: 2018-08-31 | Payer: Medicare Other | Source: Home / Self Care | Admitting: Gastroenterology

## 2018-08-31 DIAGNOSIS — Z886 Allergy status to analgesic agent status: Secondary | ICD-10-CM | POA: Insufficient documentation

## 2018-08-31 DIAGNOSIS — M1990 Unspecified osteoarthritis, unspecified site: Secondary | ICD-10-CM | POA: Diagnosis not present

## 2018-08-31 DIAGNOSIS — C249 Malignant neoplasm of biliary tract, unspecified: Secondary | ICD-10-CM | POA: Diagnosis not present

## 2018-08-31 DIAGNOSIS — D689 Coagulation defect, unspecified: Secondary | ICD-10-CM | POA: Insufficient documentation

## 2018-08-31 DIAGNOSIS — R748 Abnormal levels of other serum enzymes: Secondary | ICD-10-CM | POA: Diagnosis not present

## 2018-08-31 DIAGNOSIS — F419 Anxiety disorder, unspecified: Secondary | ICD-10-CM | POA: Insufficient documentation

## 2018-08-31 DIAGNOSIS — Z7984 Long term (current) use of oral hypoglycemic drugs: Secondary | ICD-10-CM | POA: Diagnosis not present

## 2018-08-31 DIAGNOSIS — C240 Malignant neoplasm of extrahepatic bile duct: Secondary | ICD-10-CM | POA: Diagnosis not present

## 2018-08-31 DIAGNOSIS — F329 Major depressive disorder, single episode, unspecified: Secondary | ICD-10-CM | POA: Diagnosis not present

## 2018-08-31 DIAGNOSIS — Z9049 Acquired absence of other specified parts of digestive tract: Secondary | ICD-10-CM | POA: Insufficient documentation

## 2018-08-31 DIAGNOSIS — E119 Type 2 diabetes mellitus without complications: Secondary | ICD-10-CM | POA: Insufficient documentation

## 2018-08-31 DIAGNOSIS — K831 Obstruction of bile duct: Secondary | ICD-10-CM | POA: Diagnosis present

## 2018-08-31 DIAGNOSIS — Z888 Allergy status to other drugs, medicaments and biological substances status: Secondary | ICD-10-CM | POA: Insufficient documentation

## 2018-08-31 DIAGNOSIS — Z87891 Personal history of nicotine dependence: Secondary | ICD-10-CM | POA: Insufficient documentation

## 2018-08-31 DIAGNOSIS — Z8249 Family history of ischemic heart disease and other diseases of the circulatory system: Secondary | ICD-10-CM | POA: Diagnosis not present

## 2018-08-31 DIAGNOSIS — G709 Myoneural disorder, unspecified: Secondary | ICD-10-CM | POA: Insufficient documentation

## 2018-08-31 DIAGNOSIS — Z7951 Long term (current) use of inhaled steroids: Secondary | ICD-10-CM | POA: Insufficient documentation

## 2018-08-31 DIAGNOSIS — Z4659 Encounter for fitting and adjustment of other gastrointestinal appliance and device: Secondary | ICD-10-CM | POA: Diagnosis not present

## 2018-08-31 DIAGNOSIS — K219 Gastro-esophageal reflux disease without esophagitis: Secondary | ICD-10-CM | POA: Diagnosis not present

## 2018-08-31 DIAGNOSIS — I10 Essential (primary) hypertension: Secondary | ICD-10-CM | POA: Insufficient documentation

## 2018-08-31 HISTORY — PX: ERCP: SHX5425

## 2018-08-31 LAB — GLUCOSE, CAPILLARY: Glucose-Capillary: 97 mg/dL (ref 70–99)

## 2018-08-31 SURGERY — ERCP, WITH INTERVENTION IF INDICATED
Anesthesia: General

## 2018-08-31 MED ORDER — SUCCINYLCHOLINE CHLORIDE 20 MG/ML IJ SOLN
INTRAMUSCULAR | Status: DC | PRN
  Administered 2018-08-31: 100 mg via INTRAVENOUS

## 2018-08-31 MED ORDER — PROPOFOL 10 MG/ML IV BOLUS
INTRAVENOUS | Status: DC | PRN
  Administered 2018-08-31: 130 mg via INTRAVENOUS

## 2018-08-31 MED ORDER — FENTANYL CITRATE (PF) 100 MCG/2ML IJ SOLN
INTRAMUSCULAR | Status: DC | PRN
  Administered 2018-08-31: 100 ug via INTRAVENOUS

## 2018-08-31 MED ORDER — PROPOFOL 10 MG/ML IV BOLUS
INTRAVENOUS | Status: AC
  Filled 2018-08-31: qty 20

## 2018-08-31 MED ORDER — LIDOCAINE HCL (CARDIAC) PF 100 MG/5ML IV SOSY
PREFILLED_SYRINGE | INTRAVENOUS | Status: DC | PRN
  Administered 2018-08-31: 60 mg via INTRAVENOUS

## 2018-08-31 MED ORDER — FENTANYL CITRATE (PF) 100 MCG/2ML IJ SOLN
INTRAMUSCULAR | Status: AC
  Administered 2018-08-31: 25 ug via INTRAVENOUS
  Filled 2018-08-31: qty 2

## 2018-08-31 MED ORDER — SODIUM CHLORIDE 0.9 % IV SOLN
INTRAVENOUS | Status: DC
  Administered 2018-08-31: 13:00:00 via INTRAVENOUS

## 2018-08-31 MED ORDER — ONDANSETRON HCL 4 MG/2ML IJ SOLN
INTRAMUSCULAR | Status: DC | PRN
  Administered 2018-08-31: 4 mg via INTRAVENOUS

## 2018-08-31 MED ORDER — FENTANYL CITRATE (PF) 100 MCG/2ML IJ SOLN
INTRAMUSCULAR | Status: AC
  Filled 2018-08-31: qty 2

## 2018-08-31 MED ORDER — FENTANYL CITRATE (PF) 100 MCG/2ML IJ SOLN
25.0000 ug | INTRAMUSCULAR | Status: DC | PRN
  Administered 2018-08-31 (×2): 25 ug via INTRAVENOUS

## 2018-08-31 MED ORDER — DEXAMETHASONE SODIUM PHOSPHATE 10 MG/ML IJ SOLN
INTRAMUSCULAR | Status: DC | PRN
  Administered 2018-08-31: 10 mg via INTRAVENOUS

## 2018-08-31 NOTE — Anesthesia Procedure Notes (Signed)
Procedure Name: Intubation Date/Time: 08/31/2018 1:46 PM Performed by: Jonna Clark, CRNA Pre-anesthesia Checklist: Patient identified, Patient being monitored, Timeout performed, Emergency Drugs available and Suction available Patient Re-evaluated:Patient Re-evaluated prior to induction Oxygen Delivery Method: Circle system utilized Preoxygenation: Pre-oxygenation with 100% oxygen Induction Type: IV induction Ventilation: Mask ventilation without difficulty Laryngoscope Size: 3 and McGraph Grade View: Grade I Tube type: Oral Tube size: 7.5 mm Number of attempts: 1 Airway Equipment and Method: Stylet Placement Confirmation: ETT inserted through vocal cords under direct vision,  positive ETCO2 and breath sounds checked- equal and bilateral Secured at: 21 cm Tube secured with: Tape Dental Injury: Teeth and Oropharynx as per pre-operative assessment

## 2018-08-31 NOTE — Anesthesia Postprocedure Evaluation (Signed)
Anesthesia Post Note  Patient: Shawn Cardenas  Procedure(s) Performed: ENDOSCOPIC RETROGRADE CHOLANGIOPANCREATOGRAPHY (ERCP) (N/A )  Patient location during evaluation: PACU Anesthesia Type: General Level of consciousness: awake and alert Pain management: pain level controlled Vital Signs Assessment: post-procedure vital signs reviewed and stable Respiratory status: spontaneous breathing, nonlabored ventilation and respiratory function stable Cardiovascular status: blood pressure returned to baseline and stable Postop Assessment: no apparent nausea or vomiting Anesthetic complications: no     Last Vitals:  Vitals:   08/31/18 1446 08/31/18 1455  BP:  131/75  Pulse: 84 79  Resp: 10 18  Temp:  36.6 C  SpO2: 96% 96%    Last Pain:  Vitals:   08/31/18 1455  TempSrc:   PainSc: [place]

## 2018-08-31 NOTE — Transfer of Care (Signed)
Immediate Anesthesia Transfer of Care Note  Patient: Shawn Cardenas  Procedure(s) Performed: ENDOSCOPIC RETROGRADE CHOLANGIOPANCREATOGRAPHY (ERCP) (N/A )  Patient Location: PACU  Anesthesia Type:General  Level of Consciousness: drowsy and patient cooperative  Airway & Oxygen Therapy: Patient Spontanous Breathing and Patient connected to face mask oxygen  Post-op Assessment: Report given to RN and Post -op Vital signs reviewed and stable  Post vital signs: Reviewed and stable  Last Vitals:  Vitals Value Taken Time  BP 123/72 08/31/2018  2:26 PM  Temp 36.4 C 08/31/2018  2:26 PM  Pulse 75 08/31/2018  2:29 PM  Resp 17 08/31/2018  2:29 PM  SpO2 100 % 08/31/2018  2:29 PM  Vitals shown include unvalidated device data.  Last Pain:  Vitals:   08/31/18 1426  TempSrc:   PainSc: Asleep         Complications: No apparent anesthesia complications

## 2018-08-31 NOTE — Anesthesia Post-op Follow-up Note (Signed)
Anesthesia QCDR form completed.        

## 2018-08-31 NOTE — Anesthesia Preprocedure Evaluation (Addendum)
Anesthesia Evaluation  Patient identified by MRN, date of birth, ID band Patient awake    Reviewed: Allergy & Precautions, H&P , NPO status , Patient's Chart, lab work & pertinent test results  Airway Mallampati: II  TM Distance: >3 FB Neck ROM: full   Comment: beard Dental  (+) Upper Dentures   Pulmonary neg recent URI, former smoker,           Cardiovascular hypertension, + CAD       Neuro/Psych PSYCHIATRIC DISORDERS Anxiety Depression negative neurological ROS     GI/Hepatic GERD  ,(+) Hepatitis -, C  Endo/Other  diabetes  Renal/GU      Musculoskeletal  (+) Arthritis ,   Abdominal   Peds  Hematology  (+) Blood dyscrasia, , Hemophilia A   Anesthesia Other Findings Past Medical History: No date: Anxiety No date: Arthritis No date: Blood transfusion without reported diagnosis No date: Clotting disorder ([place]) No date: Depression No date: Diabetes mellitus without complication ([facility]) No date: GERD (gastroesophageal reflux disease) No date: Hypertension No date: Neuromuscular disorder [facility])  Past Surgical History: No date: CHOLECYSTECTOMY 05/29/2018: ERCP; N/A     Comment:  Procedure: ENDOSCOPIC RETROGRADE               CHOLANGIOPANCREATOGRAPHY (ERCP);  Surgeon: [name],               MD;  Location: [facility] ENDOSCOPY;  Service: Endoscopy;                Laterality: N/A; 06/04/2018: ERCP; N/A     Comment:  Procedure: ENDOSCOPIC RETROGRADE               CHOLANGIOPANCREATOGRAPHY (ERCP);  Surgeon: [name],               MD;  Location: [facility] ENDOSCOPY;  Service: Endoscopy;                Laterality: N/A; 08/14/2018: ERCP; N/A     Comment:  Procedure: ENDOSCOPIC RETROGRADE               CHOLANGIOPANCREATOGRAPHY (ERCP);  Surgeon: [name],               MD;  Location: [facility] [place] [facility] ENDOSCOPY;  Service: Endoscopy;                Laterality: N/A; 06/14/2018: EUS; N/A     Comment:  Procedure: FULL UPPER ENDOSCOPIC  ULTRASOUND (EUS)               RADIAL;  Surgeon: Holly Bodily, MD;  Location:               [facility] ENDOSCOPY;  Service: Gastroenterology;  Laterality:               N/A; No date: LEG AMPUTATION; Right     Reproductive/Obstetrics negative OB ROS                           Anesthesia Physical Anesthesia Plan  ASA: III  Anesthesia Plan: General ETT   Post-op Pain Management:    Induction:   PONV Risk Score and Plan: Ondansetron, Dexamethasone and Treatment may vary due to age or medical condition  Airway Management Planned:   Additional Equipment:   Intra-op Plan:   Post-operative Plan:   Informed Consent: I have reviewed the patients History and Physical, chart, labs and discussed the procedure including the risks, benefits and alternatives for the proposed  anesthesia with the patient or authorized representative who has indicated his/her understanding and acceptance.     Dental Advisory Given  Plan Discussed with: Anesthesiologist and CRNA  Anesthesia Plan Comments:         Anesthesia Quick Evaluation

## 2018-08-31 NOTE — Op Note (Signed)
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 Kary Kos, MD (Referring MD) Medicines:            General Anesthesia Complications:        No immediate complications. Procedure:            Pre-Anesthesia Assessment:                       - Prior to the procedure, a History and Physical was                        performed, and patient medications and allergies were                        reviewed. The patient's tolerance of previous                        anesthesia was also reviewed. The risks and benefits of                        the procedure and the sedation options and risks were                        discussed with the patient. All questions were                        answered, and informed consent was obtained. Prior                        Anticoagulants: The patient has taken no previous                        anticoagulant or antiplatelet agents. ASA Grade                        Assessment: III - A patient with severe systemic                        disease. After reviewing the risks and benefits, the                        patient was deemed in satisfactory condition to undergo                        the procedure.                       After obtaining informed consent, the scope was passed  under direct vision. Throughout the procedure, the                        patient's blood pressure, pulse, and oxygen saturations                        were monitored continuously. The Duodenoscope was   introduced through the mouth, and used to inject                        contrast into and used to inject contrast into the bile                        duct. The ERCP was accomplished without difficulty. The                        patient tolerated the procedure well. Findings:      A scout film of the abdomen was obtained. One stent ending in the main       bile duct was seen. Surgical clips, consistent with previous       cholecystectomy, were seen in the area of the. The esophagus was       successfully intubated under direct vision. The scope was advanced to a       normal major papilla in the descending duodenum without detailed       examination of the pharynx, larynx and associated structures, and upper       GI tract. The upper GI tract was grossly normal. One stent was removed       from the common bile duct using a snare. The bile duct was deeply       cannulated with the short-nosed traction sphincterotome. Contrast was       injected. I personally interpreted the bile duct images. There was brisk       flow of contrast through the ducts. Image quality was excellent.       Contrast extended to the entire biliary tree. The lower third of the       main bile duct contained a single segmental stenosis. A wire was passed       into the biliary tree. One 10 Fr by 7 cm plastic stent with a single       external flap and a single internal flap was placed 6 cm into the common       bile duct. Bile flowed through the stent. The stent was in good       position. One 7 Fr by 5 cm plastic stent with a single external flap and       a single internal flap was placed 4 cm into the common bile duct. Bile       flowed through the stent. The stent was in good position. Impression:           - A single segmental biliary stricture was found in the                        lower third of the main bile duct.                       - One stent was removed from the common bile duct.                        -  One plastic stent was placed into the common bile                        duct.                       - One plastic stent was placed into the common bile                        duct. Recommendation:       - Discharge patient to home.                       - Resume previous diet.                       - Continue present medications.                       - Repeat ERCP in 3 months to exchange stent. Procedure Code(s):    --- Professional ---                       [phone], Endoscopic retrograde cholangiopancreatography                        (ERCP); with removal and exchange of stent(s), biliary                        or pancreatic duct, including pre- and post-dilation                        and guide wire passage, when performed, including                        sphincterotomy, when performed, each stent exchanged                       43276, 59, Endoscopic retrograde                        cholangiopancreatography (ERCP); with removal and                        exchange of stent(s), biliary or pancreatic duct,                        including pre- and post-dilation and guide wire                        passage, when performed, including sphincterotomy, when                        performed, each stent exchanged                       82800, Endoscopic catheterization of the biliary ductal                        system, radiological supervision and interpretation Diagnosis Code(s):    --- Professional ---                       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 All rights reserved. The codes documented in this report are preliminary and upon coder review may  be revised  to meet current compliance requirements. [name] MD, MD 08/31/2018 2:20:10 PM This report has been signed electronically. Number of Addenda: 0 Note Initiated On: 08/31/2018 12:55 PM      [facility]

## 2018-08-31 NOTE — Interval H&P Note (Signed)
History and Physical Interval Note:  08/31/2018 12:16 PM  Shawn Cardenas  has presented today for surgery, with the diagnosis of Stent Exchange.  The various methods of treatment have been discussed with the patient and family. After consideration of risks, benefits and other options for treatment, the patient has consented to  Procedure(s): ENDOSCOPIC RETROGRADE CHOLANGIOPANCREATOGRAPHY (ERCP) (N/A) as a surgical intervention.  The patient's history has been reviewed, patient examined, no change in status, stable for surgery.  I have reviewed the patient's chart and labs.  Questions were answered to the patient's satisfaction.     Glynn Freas [employer]

## 2018-09-03 ENCOUNTER — Encounter: Payer: Self-pay | Admitting: Gastroenterology

## 2018-09-03 LAB — GLUCOSE, CAPILLARY: Glucose-Capillary: 93 mg/dL (ref 70–99)

## 2018-09-04 ENCOUNTER — Ambulatory Visit: Payer: Medicare Other | Admitting: Hematology and Oncology

## 2018-09-19 ENCOUNTER — Telehealth: Payer: Self-pay | Admitting: Cardiovascular Disease

## 2018-09-19 NOTE — Telephone Encounter (Signed)
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
## 2018-09-24 ENCOUNTER — Other Ambulatory Visit: Payer: Self-pay

## 2018-09-24 ENCOUNTER — Telehealth (INDEPENDENT_AMBULATORY_CARE_PROVIDER_SITE_OTHER): Payer: Medicare Other | Admitting: Cardiovascular Disease

## 2018-09-24 DIAGNOSIS — F17200 Nicotine dependence, unspecified, uncomplicated: Secondary | ICD-10-CM

## 2018-09-24 DIAGNOSIS — E11618 Type 2 diabetes mellitus with other diabetic arthropathy: Secondary | ICD-10-CM | POA: Diagnosis not present

## 2018-09-24 DIAGNOSIS — E782 Mixed hyperlipidemia: Secondary | ICD-10-CM

## 2018-09-24 DIAGNOSIS — I2510 Atherosclerotic heart disease of native coronary artery without angina pectoris: Secondary | ICD-10-CM

## 2018-09-24 DIAGNOSIS — D66 Hereditary factor VIII deficiency: Secondary | ICD-10-CM | POA: Diagnosis not present

## 2018-09-24 MED ORDER — POTASSIUM CHLORIDE CRYS ER 20 MEQ PO TBCR: 20.0000 meq | EXTENDED_RELEASE_TABLET | Freq: Every day | ORAL | 3 refills | Status: AC

## 2018-09-24 MED ORDER — FUROSEMIDE 40 MG PO TABS: 40.0000 mg | ORAL_TABLET | Freq: Every day | ORAL | 3 refills | Status: AC

## 2018-09-24 NOTE — Patient Instructions (Addendum)
Preop note to Dr. Dewaine Conger at [facility] for surgery   Medication Instructions:  Your physician has recommended you make the following change in your medication:  1. START Furosemide 40 mg once daily 2. START Potassium 20 mEq once daily  If you need a refill on your cardiac medications before your next appointment, please call your pharmacy.    Lab work: No new labs needed   If you have labs (blood work) drawn today and your tests are completely normal, you will receive your results only by: Marland Kitchen MyChart Message (if you have MyChart) OR . A paper copy in the mail If you have any lab test that is abnormal or we need to change your treatment, we will call you to review the results.   Testing/Procedures: No new testing needed   Follow-Up: At [facility], you and your health needs are our priority.  As part of our continuing mission to provide you with exceptional heart care, we have created designated Provider Care Teams.  These Care Teams include your primary Cardiologist (physician) and Advanced Practice Providers (APPs -  Physician Assistants and Nurse Practitioners) who all work together to provide you with the care you need, when you need it.  . You will need a follow up appointment in 6 months .   Please call our office 2 months in advance to schedule this appointment.    . Providers on your designated Care Team:   . Murray Hodgkins, NP . Christell Faith, PA-C  . Marrianne Mood, PA-C  Any Other Special Instructions Will Be Listed Below (If Applicable).  For educational health videos Log in to : [email or web address] Or : [email or web address], password : [email or web address]

## 2018-10-03 ENCOUNTER — Telehealth: Payer: Self-pay | Admitting: Cardiovascular Disease

## 2018-10-03 ENCOUNTER — Other Ambulatory Visit: Payer: Self-pay

## 2018-10-03 MED ORDER — PRAVASTATIN SODIUM 40 MG PO TABS: 40.0000 mg | ORAL_TABLET | Freq: Every day | ORAL | 0 refills | Status: AC

## 2018-10-03 NOTE — Telephone Encounter (Signed)
[facility] calling    *STAT* If patient is at the pharmacy, call can be transferred to refill team.   1. Which medications need to be refilled? (please list name of each medication and dose if known) pravastatin (PRAVACHOL) 40 MG 1 tablet daily   2. Which pharmacy/location (including street and city if local pharmacy) is medication to be sent to? [facility]   3. Do they need a 30 day or 90 day supply? 30 day

## 2018-10-03 NOTE — Telephone Encounter (Signed)
pravastatin (PRAVACHOL) 40 MG tablet 90 tablet 0 10/03/2018    Sig - Route: Take 1 tablet (40 mg total) by mouth daily. - Oral   Sent to pharmacy as: pravastatin (PRAVACHOL) 40 MG tablet   E-Prescribing Status: Sent to pharmacy (10/03/2018 11:46 AM EDT)   Pharmacy   [place], [place]

## 2018-11-29 ENCOUNTER — Other Ambulatory Visit: Payer: Self-pay

## 2018-11-29 DIAGNOSIS — Z4659 Encounter for fitting and adjustment of other gastrointestinal appliance and device: Secondary | ICD-10-CM

## 2018-12-14 ENCOUNTER — Other Ambulatory Visit: Admission: RE | Admit: 2018-12-14 | Payer: Medicare Other | Source: Ambulatory Visit

## 2018-12-17 ENCOUNTER — Telehealth: Payer: Self-pay | Admitting: Gastroenterology

## 2018-12-17 NOTE — Telephone Encounter (Signed)
Shawn Cardenas wife called  To cancel appointment for 2019/01/12 because he passed away. She would like to thank DR Allen Norris for his honesty & frankness with the situation.

## 2018-12-18 ENCOUNTER — Ambulatory Visit: Admission: RE | Admit: 2018-12-18 | Payer: Medicare Other | Source: Ambulatory Visit | Admitting: Gastroenterology

## 2018-12-18 SURGERY — ERCP, WITH INTERVENTION IF INDICATED
Anesthesia: General

## 2018-12-25 DEATH — deceased

## 2019-07-25 IMAGING — CT CT CHEST WITHOUT CONTRAST
2 of 4 series · 15 of 36 positions shown, 18 images · non-contrast
Comparison: CT chest dated 03/26/2017

CLINICAL DATA: Pancreatic cancer.  Concern for metastatic disease.

EXAM:
CT CHEST WITHOUT CONTRAST
TECHNIQUE: Multidetector CT imaging of the chest was performed following the
standard protocol without IV contrast.

[Series 2: chest · axial · 0.80mm/px · z∈[-1291,-989]mm · 12 of 179 slices shown, 15 images (1 of 2)]
[im 14/179  mediastinal]
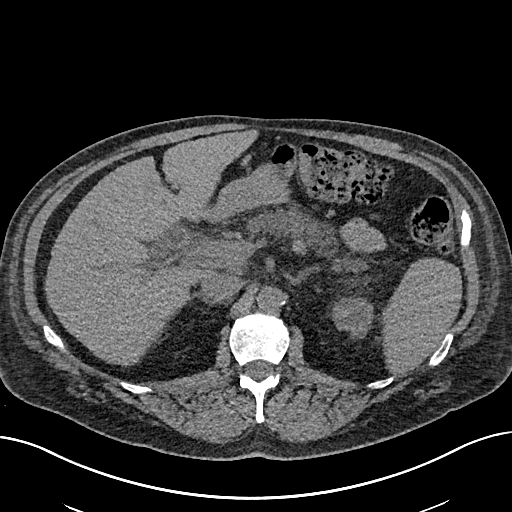
[im 14/179  lung]
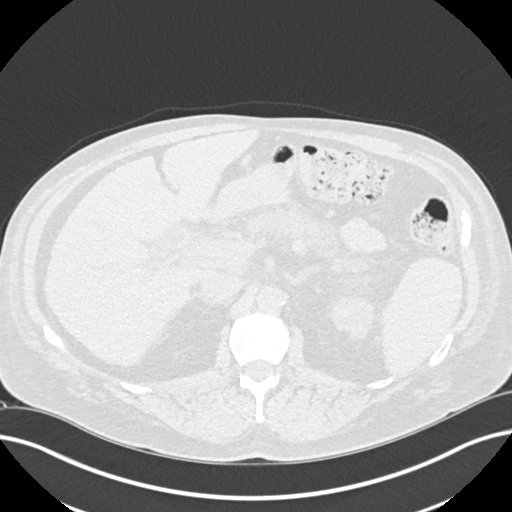
[im 28/179  lung]
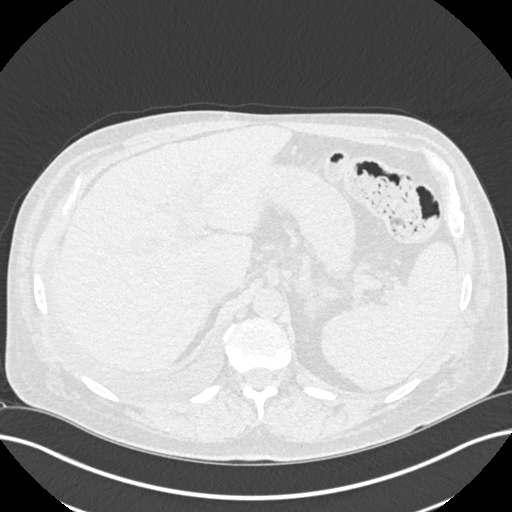
[im 42/179  lung]
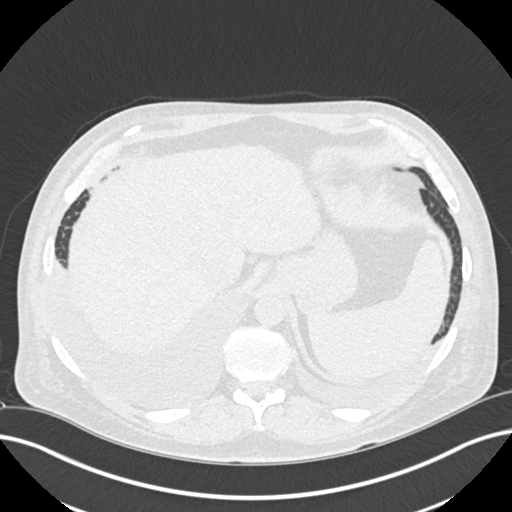
[im 55/179  lung]
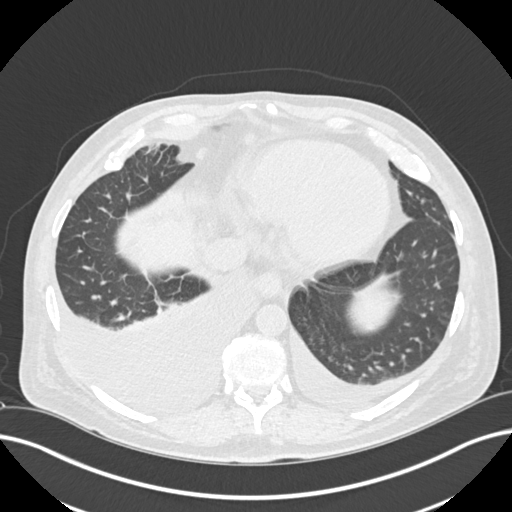
[im 69/179  mediastinal]
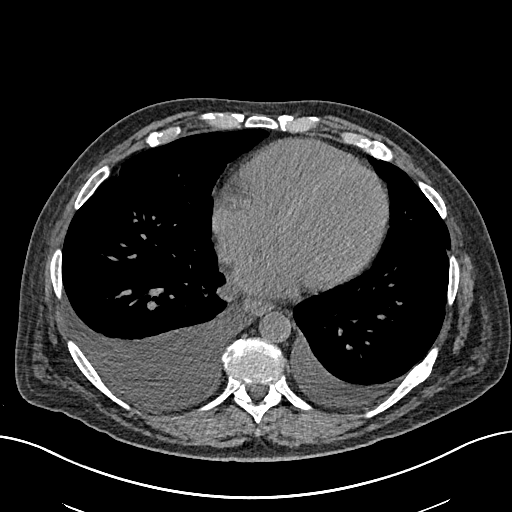
[im 69/179  lung]
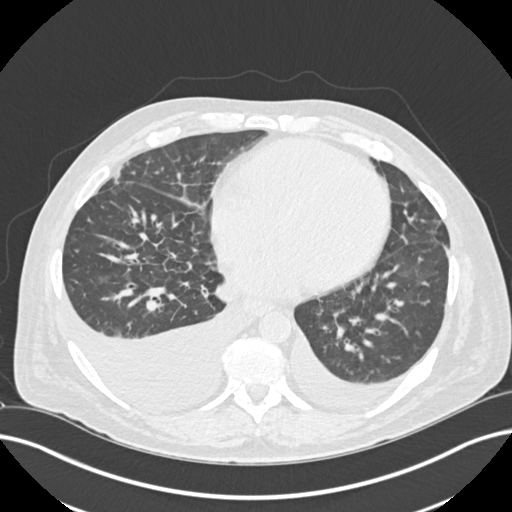
[im 83/179  lung]
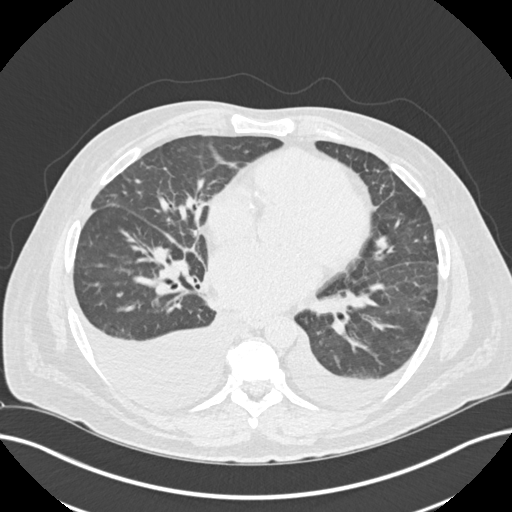
[im 96/179  lung]
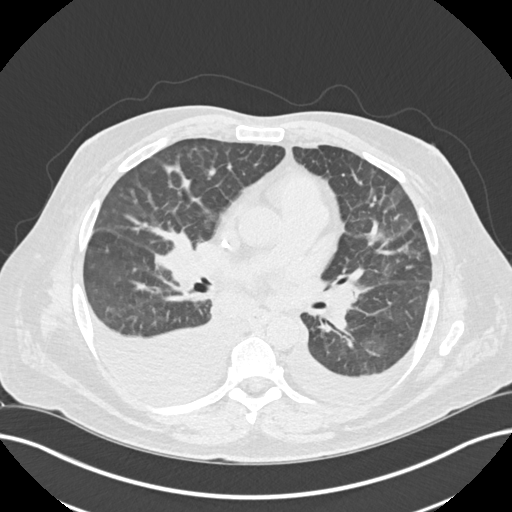
[im 110/179  lung]
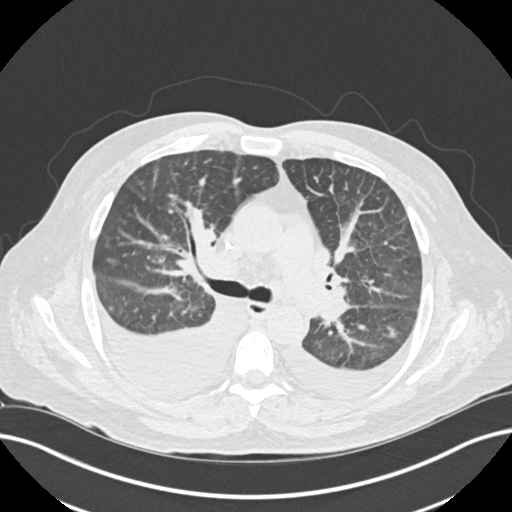
[im 124/179  mediastinal]
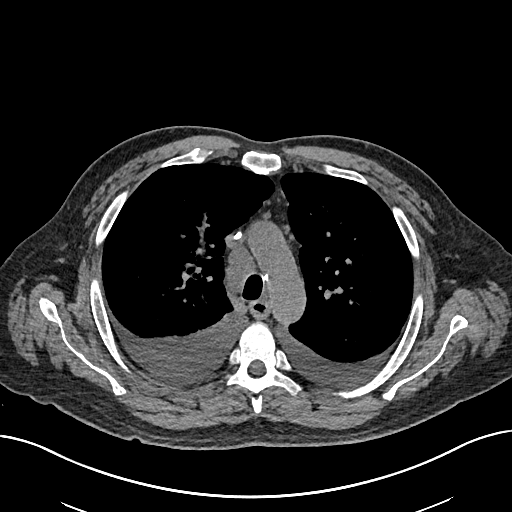
[im 124/179  lung]
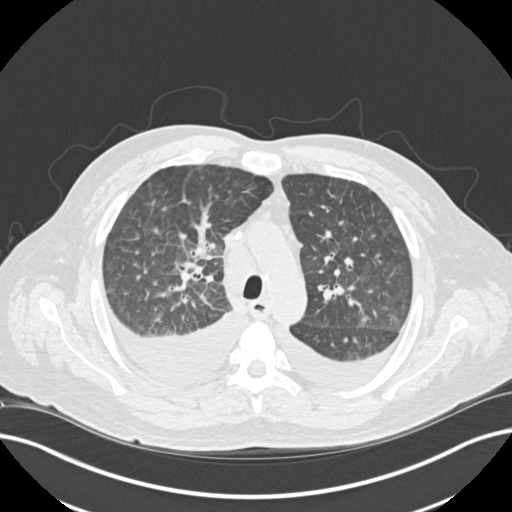
[im 137/179  lung]
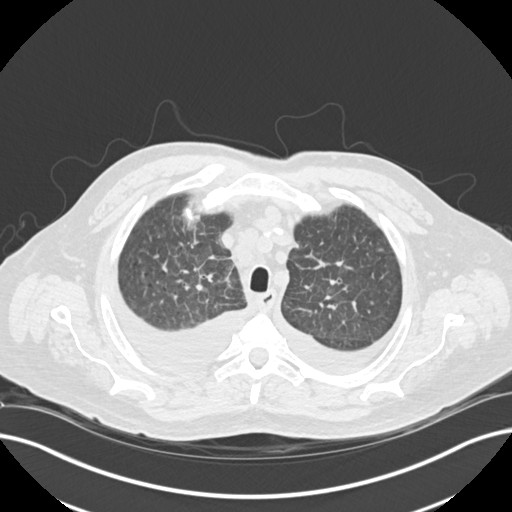
[im 151/179  lung]
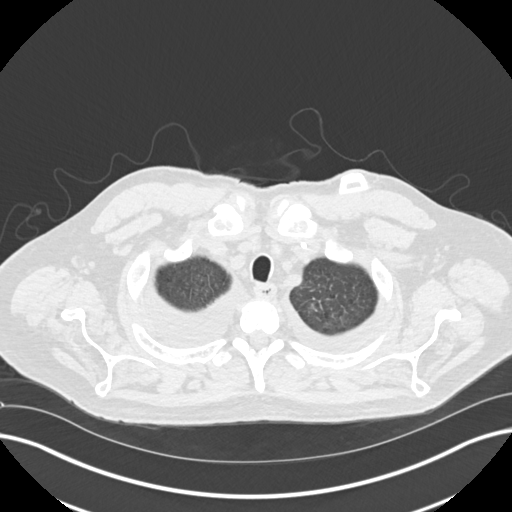
[im 165/179  lung]
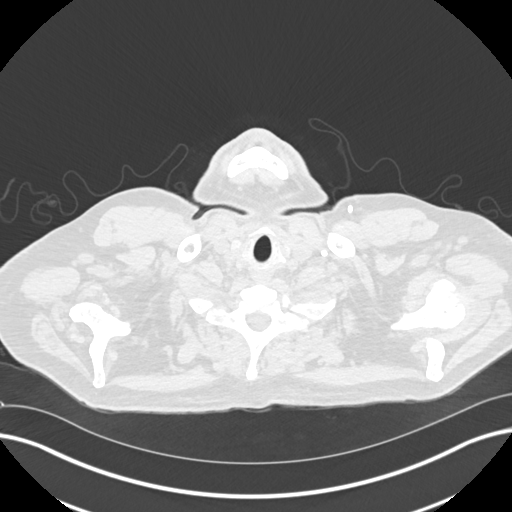

[Series 5: chest · coronal · 0.70mm/px · 3 of 186 slices shown (2 of 2)]
[im 38/186  lung]
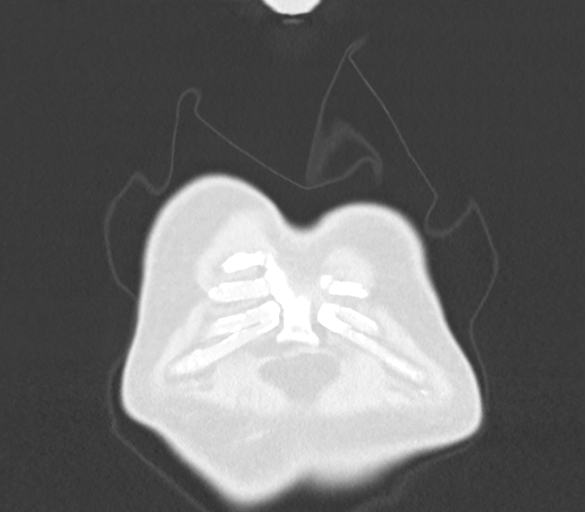
[im 75/186  lung]
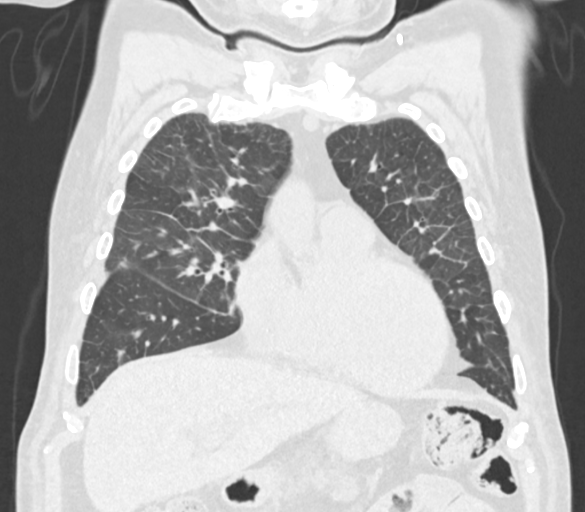
[im 112/186  lung]
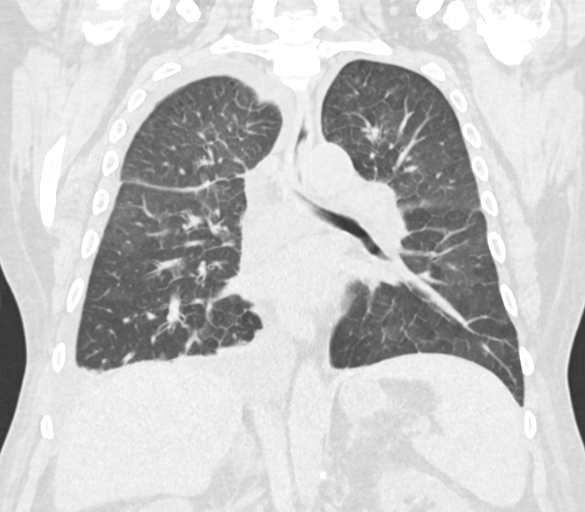

[15 of 36 positions shown; findings below may reference images not displayed]

FINDINGS: Cardiovascular: The heart is mild-to-moderately enlarged. Coronary
artery calcifications are noted.

Mediastinum/Nodes: There are multiple enlarged mediastinal lymph
nodes. For example there is a pathologically enlarged 2.2 cm right
paratracheal lymph node. Multiple enlarged hilar lymph nodes are
noted. There is a 2.2 cm subcarinal lymph node. There are prominent
but subcentimeter supraclavicular lymph nodes, greatest on the left.
No significant axillary adenopathy. There is mild nodularity of the
thyroid gland, similar to prior study.

Lungs/Pleura: There is a moderate to large right-sided pleural
effusion. There is a small left-sided pleural effusion. There is
diffuse bilateral interlobular septal thickening. There are few
scattered ground-glass airspace opacities common most notably in the
left upper lobe. There is no definite large solitary pulmonary
nodule. There is a 6 mm nodule in the superior segment of the left
lower lobe. A background of mild emphysematous changes is noted.

Upper Abdomen: The liver appears cirrhotic. Findings are suspicious
for worsening intrahepatic and extrahepatic biliary ductal
dilatation. A partially visualized biliary stent is noted. The
spleen is enlarged.

Musculoskeletal: No chest wall mass or suspicious bone lesions
identified. A left-sided Port-A-Cath is noted. The tip is well
positioned. There are old healed left-sided rib fractures.
IMPRESSION: 1. Evaluation is limited by the lack of IV contrast.
2. Pathologically enlarged mediastinal lymph nodes. These are new
since CT chest dated 03/27/2007. Nodal metastatic disease cannot be
excluded.
3. Moderate/large right-sided pleural effusion. Small left-sided
pleural effusion.
4. Mild cardiac enlargement with diffuse bilateral interlobular
septal thickening and scattered ground-glass airspace opacities is
most concerning for volume overload with developing pulmonary edema.
There are a few scattered pulmonary nodules bilaterally. Attention
on follow-up examinations is recommended.
5. Cirrhosis with sequela of portal hypertension. Findings are
suspicious for worsening intrahepatic and extrahepatic biliary
ductal dilatation. Correlation with laboratory studies is
recommended.
